# Patient Record
Sex: Female | Born: 1935 | Race: White | Hispanic: No | Marital: Married | Smoking: Never smoker
Health system: Southern US, Community
[De-identification: ages and names within clinical notes are randomized; demographics above are authoritative.]

## PROBLEM LIST (undated history)

## (undated) DIAGNOSIS — Z959 Presence of cardiac and vascular implant and graft, unspecified: Secondary | ICD-10-CM

## (undated) DIAGNOSIS — R42 Dizziness and giddiness: Secondary | ICD-10-CM

## (undated) DIAGNOSIS — G629 Polyneuropathy, unspecified: Secondary | ICD-10-CM

## (undated) DIAGNOSIS — I10 Essential (primary) hypertension: Secondary | ICD-10-CM

## (undated) DIAGNOSIS — M1990 Unspecified osteoarthritis, unspecified site: Secondary | ICD-10-CM

## (undated) DIAGNOSIS — E789 Disorder of lipoprotein metabolism, unspecified: Secondary | ICD-10-CM

## (undated) DIAGNOSIS — I4891 Unspecified atrial fibrillation: Secondary | ICD-10-CM

## (undated) DIAGNOSIS — Z9289 Personal history of other medical treatment: Secondary | ICD-10-CM

## (undated) DIAGNOSIS — M5417 Radiculopathy, lumbosacral region: Principal | ICD-10-CM

## (undated) DIAGNOSIS — G259 Extrapyramidal and movement disorder, unspecified: Secondary | ICD-10-CM

## (undated) DIAGNOSIS — K219 Gastro-esophageal reflux disease without esophagitis: Secondary | ICD-10-CM

## (undated) DIAGNOSIS — R55 Syncope and collapse: Secondary | ICD-10-CM

## (undated) DIAGNOSIS — F419 Anxiety disorder, unspecified: Secondary | ICD-10-CM

## (undated) DIAGNOSIS — I440 Atrioventricular block, first degree: Secondary | ICD-10-CM

## (undated) HISTORY — PX: CHOLECYSTECTOMY: SHX55

## (undated) HISTORY — PX: LUMBAR DISC SURGERY: SHX700

## (undated) HISTORY — DX: Radiculopathy, lumbosacral region: M54.17

## (undated) HISTORY — PX: BILATERAL OOPHORECTOMY: SHX1221

## (undated) HISTORY — PX: CATARACT EXTRACTION W/ INTRAOCULAR LENS  IMPLANT, BILATERAL: SHX1307

## (undated) HISTORY — PX: TOTAL KNEE ARTHROPLASTY: SHX125

## (undated) HISTORY — PX: APPENDECTOMY: SHX54

## (undated) HISTORY — DX: Extrapyramidal and movement disorder, unspecified: G25.9

## (undated) HISTORY — DX: Presence of cardiac and vascular implant and graft, unspecified: Z95.9

## (undated) HISTORY — PX: POSTERIOR LUMBAR FUSION: SHX6036

## (undated) HISTORY — PX: HIP FRACTURE SURGERY: SHX118

---

## 1963-07-02 HISTORY — PX: ABDOMINAL HYSTERECTOMY: SHX81

## 1969-07-01 DIAGNOSIS — Z9289 Personal history of other medical treatment: Secondary | ICD-10-CM

## 1969-07-01 HISTORY — DX: Personal history of other medical treatment: Z92.89

## 1969-07-01 HISTORY — PX: FOREARM FRACTURE SURGERY: SHX649

## 1969-07-01 HISTORY — PX: FEMUR FRACTURE SURGERY: SHX633

## 1969-07-01 HISTORY — PX: ANKLE FRACTURE SURGERY: SHX122

## 1970-07-01 HISTORY — PX: FEMUR HARDWARE REMOVAL: SUR1124

## 1999-04-12 ENCOUNTER — Encounter: Admission: RE | Admit: 1999-04-12 | Discharge: 1999-04-12 | Payer: Self-pay | Admitting: Internal Medicine

## 1999-04-25 ENCOUNTER — Encounter: Payer: Self-pay | Admitting: Orthopedic Surgery

## 1999-04-25 ENCOUNTER — Ambulatory Visit (HOSPITAL_COMMUNITY): Admission: RE | Admit: 1999-04-25 | Discharge: 1999-04-25 | Payer: Self-pay | Admitting: Orthopedic Surgery

## 1999-05-09 ENCOUNTER — Ambulatory Visit (HOSPITAL_COMMUNITY): Admission: RE | Admit: 1999-05-09 | Discharge: 1999-05-09 | Payer: Self-pay | Admitting: Orthopedic Surgery

## 1999-05-09 ENCOUNTER — Encounter: Payer: Self-pay | Admitting: Orthopedic Surgery

## 1999-05-23 ENCOUNTER — Ambulatory Visit (HOSPITAL_COMMUNITY): Admission: RE | Admit: 1999-05-23 | Discharge: 1999-05-23 | Payer: Self-pay | Admitting: Orthopedic Surgery

## 1999-05-23 ENCOUNTER — Encounter: Payer: Self-pay | Admitting: Orthopedic Surgery

## 1999-07-20 ENCOUNTER — Encounter: Payer: Self-pay | Admitting: Neurosurgery

## 1999-07-24 ENCOUNTER — Inpatient Hospital Stay (HOSPITAL_COMMUNITY): Admission: RE | Admit: 1999-07-24 | Discharge: 1999-07-26 | Payer: Self-pay | Admitting: Neurosurgery

## 1999-07-24 ENCOUNTER — Encounter: Payer: Self-pay | Admitting: Neurosurgery

## 1999-09-02 ENCOUNTER — Ambulatory Visit (HOSPITAL_COMMUNITY): Admission: RE | Admit: 1999-09-02 | Discharge: 1999-09-02 | Payer: Self-pay | Admitting: Neurosurgery

## 1999-09-02 ENCOUNTER — Encounter: Payer: Self-pay | Admitting: Neurosurgery

## 1999-09-07 ENCOUNTER — Encounter: Payer: Self-pay | Admitting: Neurosurgery

## 1999-09-07 ENCOUNTER — Inpatient Hospital Stay (HOSPITAL_COMMUNITY): Admission: RE | Admit: 1999-09-07 | Discharge: 1999-09-11 | Payer: Self-pay | Admitting: Neurosurgery

## 2003-04-04 ENCOUNTER — Encounter: Payer: Self-pay | Admitting: *Deleted

## 2003-04-04 ENCOUNTER — Inpatient Hospital Stay (HOSPITAL_COMMUNITY): Admission: EM | Admit: 2003-04-04 | Discharge: 2003-04-05 | Payer: Self-pay | Admitting: Emergency Medicine

## 2003-04-05 ENCOUNTER — Encounter: Payer: Self-pay | Admitting: *Deleted

## 2004-05-20 ENCOUNTER — Emergency Department (HOSPITAL_COMMUNITY): Admission: EM | Admit: 2004-05-20 | Discharge: 2004-05-20 | Payer: Self-pay | Admitting: Emergency Medicine

## 2004-10-16 ENCOUNTER — Inpatient Hospital Stay (HOSPITAL_COMMUNITY): Admission: EM | Admit: 2004-10-16 | Discharge: 2004-10-18 | Payer: Self-pay | Admitting: Emergency Medicine

## 2005-03-11 ENCOUNTER — Ambulatory Visit: Payer: Self-pay | Admitting: Gastroenterology

## 2005-03-14 ENCOUNTER — Ambulatory Visit: Payer: Self-pay | Admitting: Gastroenterology

## 2005-08-01 ENCOUNTER — Ambulatory Visit (HOSPITAL_COMMUNITY): Admission: RE | Admit: 2005-08-01 | Discharge: 2005-08-01 | Payer: Self-pay | Admitting: Internal Medicine

## 2005-11-04 ENCOUNTER — Emergency Department (HOSPITAL_COMMUNITY): Admission: EM | Admit: 2005-11-04 | Discharge: 2005-11-04 | Payer: Self-pay | Admitting: Emergency Medicine

## 2005-11-12 ENCOUNTER — Ambulatory Visit: Payer: Self-pay | Admitting: Gastroenterology

## 2005-11-15 ENCOUNTER — Ambulatory Visit: Payer: Self-pay | Admitting: Gastroenterology

## 2006-12-29 ENCOUNTER — Encounter: Admission: RE | Admit: 2006-12-29 | Discharge: 2006-12-29 | Payer: Self-pay | Admitting: Internal Medicine

## 2007-01-26 ENCOUNTER — Encounter: Admission: RE | Admit: 2007-01-26 | Discharge: 2007-01-26 | Payer: Self-pay | Admitting: Orthopedic Surgery

## 2007-01-27 ENCOUNTER — Encounter: Admission: RE | Admit: 2007-01-27 | Discharge: 2007-01-27 | Payer: Self-pay | Admitting: Orthopedic Surgery

## 2007-01-28 ENCOUNTER — Ambulatory Visit (HOSPITAL_BASED_OUTPATIENT_CLINIC_OR_DEPARTMENT_OTHER): Admission: RE | Admit: 2007-01-28 | Discharge: 2007-01-29 | Payer: Self-pay | Admitting: Orthopedic Surgery

## 2007-01-28 ENCOUNTER — Encounter (INDEPENDENT_AMBULATORY_CARE_PROVIDER_SITE_OTHER): Payer: Self-pay | Admitting: Orthopedic Surgery

## 2007-09-22 ENCOUNTER — Encounter: Admission: RE | Admit: 2007-09-22 | Discharge: 2007-09-22 | Payer: Self-pay | Admitting: Internal Medicine

## 2008-04-01 ENCOUNTER — Ambulatory Visit: Payer: Self-pay | Admitting: Gastroenterology

## 2008-04-01 DIAGNOSIS — K219 Gastro-esophageal reflux disease without esophagitis: Secondary | ICD-10-CM | POA: Insufficient documentation

## 2008-04-01 DIAGNOSIS — R1310 Dysphagia, unspecified: Secondary | ICD-10-CM | POA: Insufficient documentation

## 2008-04-01 DIAGNOSIS — K5730 Diverticulosis of large intestine without perforation or abscess without bleeding: Secondary | ICD-10-CM | POA: Insufficient documentation

## 2008-04-04 ENCOUNTER — Ambulatory Visit: Payer: Self-pay | Admitting: Gastroenterology

## 2008-04-06 ENCOUNTER — Encounter: Admission: RE | Admit: 2008-04-06 | Discharge: 2008-04-06 | Payer: Self-pay | Admitting: Internal Medicine

## 2008-04-15 ENCOUNTER — Ambulatory Visit (HOSPITAL_COMMUNITY): Admission: RE | Admit: 2008-04-15 | Discharge: 2008-04-15 | Payer: Self-pay | Admitting: Gastroenterology

## 2008-04-19 ENCOUNTER — Encounter: Payer: Self-pay | Admitting: Gastroenterology

## 2008-10-06 ENCOUNTER — Encounter: Admission: RE | Admit: 2008-10-06 | Discharge: 2008-10-06 | Payer: Self-pay | Admitting: Internal Medicine

## 2010-03-23 ENCOUNTER — Encounter: Admission: RE | Admit: 2010-03-23 | Discharge: 2010-03-23 | Payer: Self-pay | Admitting: Neurology

## 2010-07-22 ENCOUNTER — Encounter: Payer: Self-pay | Admitting: Internal Medicine

## 2010-09-21 ENCOUNTER — Ambulatory Visit: Payer: Medicare Other | Attending: Family Medicine | Admitting: Physical Therapy

## 2010-09-21 DIAGNOSIS — H81399 Other peripheral vertigo, unspecified ear: Secondary | ICD-10-CM | POA: Insufficient documentation

## 2010-09-21 DIAGNOSIS — IMO0001 Reserved for inherently not codable concepts without codable children: Secondary | ICD-10-CM | POA: Insufficient documentation

## 2010-10-02 ENCOUNTER — Ambulatory Visit: Payer: Medicare Other | Attending: Family Medicine | Admitting: Physical Therapy

## 2010-10-02 DIAGNOSIS — IMO0001 Reserved for inherently not codable concepts without codable children: Secondary | ICD-10-CM | POA: Insufficient documentation

## 2010-10-02 DIAGNOSIS — H81399 Other peripheral vertigo, unspecified ear: Secondary | ICD-10-CM | POA: Insufficient documentation

## 2010-10-04 ENCOUNTER — Ambulatory Visit: Payer: Medicare Other | Admitting: Physical Therapy

## 2010-10-10 ENCOUNTER — Ambulatory Visit: Payer: Medicare Other | Admitting: Physical Therapy

## 2010-10-12 ENCOUNTER — Ambulatory Visit: Payer: Medicare Other | Admitting: Physical Therapy

## 2010-10-17 ENCOUNTER — Ambulatory Visit: Payer: Medicare Other | Admitting: Physical Therapy

## 2010-10-19 ENCOUNTER — Ambulatory Visit: Payer: Medicare Other | Admitting: Physical Therapy

## 2010-10-24 ENCOUNTER — Ambulatory Visit: Payer: Medicare Other | Admitting: Physical Therapy

## 2010-10-26 ENCOUNTER — Ambulatory Visit: Payer: Medicare Other | Admitting: Physical Therapy

## 2010-10-30 ENCOUNTER — Ambulatory Visit: Payer: Medicare Other | Attending: Family Medicine | Admitting: Physical Therapy

## 2010-10-30 DIAGNOSIS — IMO0001 Reserved for inherently not codable concepts without codable children: Secondary | ICD-10-CM | POA: Insufficient documentation

## 2010-10-30 DIAGNOSIS — H81399 Other peripheral vertigo, unspecified ear: Secondary | ICD-10-CM | POA: Insufficient documentation

## 2010-11-02 ENCOUNTER — Ambulatory Visit: Payer: Medicare Other | Admitting: Physical Therapy

## 2010-11-06 ENCOUNTER — Encounter: Payer: Medicare Other | Admitting: Physical Therapy

## 2010-11-08 ENCOUNTER — Ambulatory Visit: Payer: Medicare Other | Admitting: Physical Therapy

## 2010-11-13 NOTE — Op Note (Signed)
NAMEDONNELL, Kristen Garrison              ACCOUNT NO.:  [id number]   MEDICAL RECORD NO.:  [id number]          PATIENT TYPE:  AMB   LOCATION:  DSC                          FACILITY:  [facility]   PHYSICIAN:  Artist Pais. 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   PROCEDURE:  Right wrist Darrach procedure with extensor carpi ulnaris  tendon transfer.   SURGEON:  Artist Pais. Mina Marble, M.D.   ASSISTANT:  None.   ANESTHESIA:  General.   TOURNIQUET TIME:  40 minutes.   COMPLICATIONS:  None.   DRAINS:  None.   OPERATIVE REPORT:  The patient was taken to operating suite after  induction of general anesthesia.  Right upper extremity is prepped and  draped in sterile fashion.  Esmarch used to exsanguinate the limb.  Tourniquet was then inflated to 250 mm.  At this point time incision  made over the distal radial ulnar joint.  Wight wrist skin was incised.  The interval overlying the sixth dorsal compartment was incised.  The EC  tendons retracted ulnarly.  The capsule overlying the distal radial  ulnar joint was incised.  There was significant hypertrophic synovium  which was sent for confirmation and pathology.  The distal end of the  ulna was carefully resected using the small oscillating saw at the head  and neck junction.  Once this was done, the remaining aspects of joint  were debrided.  A distally based strip of the ECU tendon was harvested.  It was then placed through a drill hole in the shaft of the ulna and  sewn onto itself, thus completing the tendon transfer.  This was done  using 2-0  FiberWire.  The wound was then irrigated.  It was closed in  layers of 2-0 FiberWire in the capsule and 4-0 Vicryl on the retinaculum  overlying the extensor tendons followed by skin closure with a 3-0  Prolene.  4x4s, fluffs and a compressive dressing was applied as well as  ulnar gutter splint.  The patient tolerated well and went recovery room  stable fashion.     Artist Pais Mina Marble, M.D.  Electronically Signed    MAW/MEDQ  D:  01/29/2007  T:  01/30/2007  Job:  [id number]

## 2010-11-16 ENCOUNTER — Ambulatory Visit: Payer: Medicare Other | Admitting: Physical Therapy
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
## 2010-11-16 NOTE — Discharge Summary (Signed)
NAMESAVINA, OLSHEFSKI NO.:  [id number]   MEDICAL RECORD NO.:  [id number]          PATIENT TYPE:  INP   LOCATION:  3735                         FACILITY:  [facility]   PHYSICIAN:  Lyn Records, M.D.   DATE OF BIRTH:  May 11, 1927   DATE OF ADMISSION:  10/16/2004  DATE OF DISCHARGE:  10/18/2004                                 DISCHARGE SUMMARY   PRIMARY CARE PHYSICIAN:  Dr. Chilton Si.   CHIEF COMPLAINT AND REASON FOR ADMISSION:  Kristen Garrison is a 77 year old  patient of Dr. Thomasene Lot, wife of one of Dr. Michaelle Copas patient's, Kristen Garrison.  She was in Dr. Thomasene Lot office on the day of admission and began  complaining of palpitations that had started earlier in the morning.  Telemetry monitoring revealed atrial fibrillation with 2:1 AV conduction,  ventricular rate 160.  Subsequent rate was decreased to 75 beats per minute  and clear flutter waves were noted.  Ten minutes later spontaneously, the  patient developed high-grade AV block, heart rate down in the 40s, she  became quite pale in appearance and developed near syncope.  By the time she  was seen in the ER, she was back in sinus rhythm with a ventricular rate of  60 beats per minute.  In talking with the patient, Dr. Katrinka Blazing illicited that  she had been experiencing tachy-palpitations about three weeks ago, and the  last episode lasted about three hours.   PHYSICAL EXAMINATION:  Her blood pressure was stable at 148/70, heart rate  60 and regular, monitor shows first degree AV block.  There was a S4 on  clinical examination, otherwise examination was unremarkable.   LABORATORY DATA:  EKG showed first degree AV block as mentioned with a P-to-  R of 232 milliseconds.   Kristen Garrison was admitted by Dr. Katrinka Blazing with the following diagnoses:  1.  Atrial flutter with spontaneous high-grade AV block, symptomatic,      probable tachy-brady syndrome.  2.  History of cerebrovascular accident about 10 years prior.  Questionable     if related to thromboembolic event due to undiagnosed atrial flutter.  3.  Hypertension.  4.  Gastroesophageal reflux disease.   HOSPITAL COURSE:  ATRIAL FLUTTER AND SPONTANEOUS HIGH-GRADE BLOCK, QUESTION  TACHY-BRADY SYNDROME:  The patient was admitted to the telemetry unit where  she was continued on monitoring.  A 2-D echocardiogram was checked.  She was  started on IV heparin, and strips from Dr. Thomasene Lot office were requested for  review by Dr. Katrinka Blazing.  The following morning, she had no recurrence of  dysrhythmia.  Dr. Katrinka Blazing also reviewed the strips from Dr. Thomasene Lot office  where the patient had atrial flutter converted to sinus bradycardia with a  ventricular rate of 48 beats per minute.  She was symptomatic, but  apparently she had also received sublingual nitroglycerin and this may have  clouded the picture, i.e., the patient could of had a vaso-vagal event from  vasodilation and subsequent drop in blood pressure.  Her potassium was also  noted to be somewhat low at 3.1, and this was orally repleted.  Because of  her underlying first degree block and recent issues with high-grade block,  her atenolol was discontinued.  She was started on Cardizem with plans to  consider discharge home soon.  A 2-D echocardiogram was checked and this  showed overall LV systolic function normal with an EF of 55 to 65%, no  regional wall motion abnormalities, and a possible trivial pericardial  effusion posterior to the heart, dimensions were 4 mm posteriorly.   By October 18, 2004, she had no further recurrent episodes of SVT or  significant bradycardia.  She developed a questionable headache and nausea  probably due to the Cardizem.  Her blood pressure was stable at 122/70,  heart rate 70 and regular, potassium 3.8.  At this time, Dr. Katrinka Blazing felt she  was experiencing paroxysmal atrial flutter and fib with  ____________bradycardic consistent with tachy-brady syndrome without further  occurrences.   She was deemed appropriate for discharge home that day.  She  will be following up with Dr. Chilton Si in his office for Coumadin adjustments  and see Dr. Katrinka Blazing in two to three weeks.   FINAL DISCHARGE DIAGNOSES:  1.  Paroxysmal atrial flutter and atrial fibrillation, currently maintaining      sinus rhythm.  2.  Post-conversion bradycardia consistent with tachy-brady syndrome.  3.  Prior history of cerebrovascular accident, questionable related to a      thrombo-embolic event due to unknown paroxysmal atrial fibrillation at      that time.  4.  Hypertension, controlled.  5.  Dyslipidemia.   DISCHARGE MEDICATIONS:  1.  Stop atenolol.  2.  Cardizem CD 180 mg daily, new.  3.  Coumadin 5 mg tablets as directed, new.  4.  Lasix 40 mg daily.  5.  K-Dur 20 mEq daily.  6.  Prevacid 30 mg daily or Protonix 40 mg daily, whichever you have at      home.  7.  Premarin 0.625 mg daily.  8.  Stop aspirin.   DIET:  Heart healthy, Coumadin restrictions.   FOLLOWUP:  1.  Dr. Chilton Si on Monday for a Coumadin check.  2.  Dr. Katrinka Blazing.  The office will call her with an appointment in two to three      weeks.       ALE/MEDQ  D:  12/24/2004  T:  12/24/2004  Job:  [id number]   cc:   Lenon Curt. Chilton Si, M.D.  [address].  [place]  [place] [address]  Fax: [phone]
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
## 2010-11-16 NOTE — Op Note (Signed)
[place]. [facility]  Patient:    Kristen Garrison, Kristen Garrison                     MRN: [id number] Proc. Date: 09/07/99 Adm. Date:  [id number] Attending:  Emeterio Reeve                           Operative Report  PREOPERATIVE DIAGNOSIS:  Recurrent herniated disk of L4-5 on the left.  POSTOPERATIVE DIAGNOSIS:  Recurrent herniated disk of L4-5 on the left.  OPERATIVE PROCEDURE:  L4-5 laminectomy and diskectomy, posterior lumbar interbody fusion with a Ray threaded fusion cage.  SURGEON:  Payton Doughty, M.D.  ANESTHESIA:  General endotracheal.  PREPARATION:  Prepped with sterile Betadine, and prepped and scrubbed with alcohol wipe.  COMPLICATIONS:  None.  INDICATIONS:  This is a 77 year old right-handed lady who had a disk done at  4-5 about six weeks ago, and has a disk recurrence at that level.  DESCRIPTION OF PROCEDURE:  She was taken to the operating room, smoothly anesthetized and intubated, and placed prone on the operating table.  Following  shave, prep, and drape in the usual sterile fashion, the old skin incision was reopened and the lamina of L4 identified bilaterally, out over the 4-5 facet joint. Intraoperative x-ray confirmed correctness of level.  The pars interarticularis, remaining lamina, and inferior facet of L4 were removed, as well as the superior facet of L5.  This was done bilaterally.  The ligamentum flavum was removed. Dissecting through the scar, the L4 and 5 roots were identified on the left side, and underneath them was found a large recurrent herniated disk which was removed without difficulty.  The right side similarly was decompressed and diskectomy completed, and there was no disk rupture at that point.  Following this, 14 x 21 mm Ray threaded fusion cages were placed bilaterally. Intraoperative x-ray showed good placement of cages.  The wound was irrigated and hemostasis assured.  The cages packed with bone graft  harvested from the facet joint.  They were capped.  Intraoperative x-ray showed good placement of the cages. The fascia was reapproximated with 0 Vicryl in an interrupted fashion. Subcutaneous tissue was reapproximated with 0 Vicryl in an interrupted fashion.  Subcuticular tissue was reapproximated with 3-0 Vicryl in an interrupted fashion, and the skin was closed with 3-0 nylon in a running locked fashion.  A Betadine Telfa dressing was applied and made occlusive with Op-Site.  The patient was then returned to the recovery room in good condition. DD:  09/07/99 TD:  09/09/99 Job: [id number]

## 2010-11-16 NOTE — Op Note (Signed)
[place]. [facility]  Patient:    Kristen Garrison                      MRN: [id number] Proc. Date: 07/24/99 Adm. Date:  [id number] Attending:  Emeterio Reeve                           Operative Report  PREOPERATIVE DIAGNOSIS:  Herniated disc on the left side at L4-5.  POSTOPERATIVE DIAGNOSIS:  Herniated disc on the left side at L4-5.  OPERATIVE PROCEDURE:  L4-5 laminectomy and diskectomy.  SURGEON:  Payton Doughty, M.D.  SERVICE:  Neurosurgery.  ANESTHESIA:  General endotracheal.  PREPARATION:  Prepped with sterile Betadine prep and scrubbed with alcohol wipe.  COMPLICATIONS:  None.  DESCRIPTION OF PROCEDURE:  [age]-year-old right-handed white lady with a left  L4-5 disc, taken to the operating room and smoothly anesthetized and intubated nd placed prone on the operating table.  Following shave, prepped and draped in the usual sterile fashion.  Skin was infiltrated with 1% lidocaine with 1:400,000 epinephrine and her old skin incision was reopened.  The laminae of L4 and L5 were exposed on the left side in the subperiosteal plane.  Intraoperative x-ray confirmed correctness of the level.  An L4 hemi-semilaminectomy was carried out the ______ with the ligamentum flavum, which was removed in a retrograde fashion. There was some undercutting at the superior end of L5 as well.  This exposed the dura and the L5 root as it coursed over the disc space.  There was a large herniated disc protruding into the L5 root.  This was removed without difficulty and the disc space carefully explored for any remaining or marginally clinging fragments.  Having removed these, wound was irrigated and hemostasis assured, then Depo-Medrol-soaked fat was placed over the nerve root in the laminectomy defect. Fascia was reapproximated with 0 Vicryl in interrupted fashion, subcutaneous tissue was reapproximated with 0 Vicryl in interrupted fashion and the skin was  closed  with 3-0 nylon in a running locked fashion.  Betadine and Telfa dressing were applied and made occlusive with Op-Site and the patient returned to the recovery room in good condition. DD:  07/24/99 TD:  07/25/99 Job: [id number]

## 2010-11-20 ENCOUNTER — Encounter: Payer: Medicare Other | Admitting: Physical Therapy

## 2010-11-21 ENCOUNTER — Ambulatory Visit: Payer: Medicare Other | Admitting: Physical Therapy

## 2010-11-28 ENCOUNTER — Ambulatory Visit: Payer: Medicare Other | Admitting: Physical Therapy

## 2010-11-29 ENCOUNTER — Encounter: Payer: Medicare Other | Admitting: Physical Therapy

## 2010-12-03 ENCOUNTER — Ambulatory Visit: Payer: Medicare Other | Attending: Family Medicine | Admitting: Physical Therapy

## 2010-12-03 DIAGNOSIS — IMO0001 Reserved for inherently not codable concepts without codable children: Secondary | ICD-10-CM | POA: Insufficient documentation

## 2010-12-03 DIAGNOSIS — H81399 Other peripheral vertigo, unspecified ear: Secondary | ICD-10-CM | POA: Insufficient documentation

## 2010-12-05 ENCOUNTER — Encounter: Payer: Medicare Other | Admitting: Physical Therapy

## 2011-01-28 ENCOUNTER — Emergency Department (HOSPITAL_COMMUNITY): Payer: Medicare Other

## 2011-01-28 ENCOUNTER — Inpatient Hospital Stay (HOSPITAL_COMMUNITY)
Admission: EM | Admit: 2011-01-28 | Discharge: 2011-01-31 | DRG: 310 | Disposition: A | Discharge status: Home / Self Care | Payer: Medicare Other | Source: Ambulatory Visit | Attending: Interventional Cardiology | Admitting: Interventional Cardiology

## 2011-01-28 DIAGNOSIS — I4891 Unspecified atrial fibrillation: Principal | ICD-10-CM | POA: Diagnosis present

## 2011-01-28 DIAGNOSIS — I10 Essential (primary) hypertension: Secondary | ICD-10-CM | POA: Diagnosis present

## 2011-01-28 DIAGNOSIS — R413 Other amnesia: Secondary | ICD-10-CM | POA: Diagnosis present

## 2011-01-28 DIAGNOSIS — IMO0002 Reserved for concepts with insufficient information to code with codable children: Secondary | ICD-10-CM

## 2011-01-28 DIAGNOSIS — R079 Chest pain, unspecified: Secondary | ICD-10-CM | POA: Diagnosis present

## 2011-01-28 DIAGNOSIS — Z9849 Cataract extraction status, unspecified eye: Secondary | ICD-10-CM

## 2011-01-28 LAB — CBC
HCT: 39.8 % (ref 36.0–46.0)
Hemoglobin: 13.9 g/dL (ref 12.0–15.0)
MCH: 31.2 pg (ref 26.0–34.0)
RBC: 4.45 MIL/uL (ref 3.87–5.11)

## 2011-01-28 LAB — DIFFERENTIAL
Eosinophils Absolute: 0.1 10*3/uL (ref 0.0–0.7)
Lymphs Abs: 4.3 10*3/uL — ABNORMAL HIGH (ref 0.7–4.0)
Neutrophils Relative %: 46 % (ref 43–77)

## 2011-01-28 LAB — CK TOTAL AND CKMB (NOT AT ARMC)
CK, MB: 2.6 ng/mL (ref 0.3–4.0)
Relative Index: INVALID (ref 0.0–2.5)
Total CK: 44 U/L (ref 7–177)

## 2011-01-28 LAB — BASIC METABOLIC PANEL
Calcium: 9 mg/dL (ref 8.4–10.5)
Chloride: 105 mEq/L (ref 96–112)
GFR calc Af Amer: 58 mL/min — ABNORMAL LOW (ref >60)
Glucose, Bld: 107 mg/dL — ABNORMAL HIGH (ref 70–99)

## 2011-01-28 LAB — CARDIAC PANEL(CRET KIN+CKTOT+MB+TROPI): Troponin I: <0.3 ng/mL (ref <0.30)

## 2011-01-29 LAB — CARDIAC PANEL(CRET KIN+CKTOT+MB+TROPI)
CK, MB: 3.3 ng/mL (ref 0.3–4.0)
Relative Index: INVALID (ref 0.0–2.5)
Total CK: 45 U/L (ref 7–177)

## 2011-01-29 LAB — CBC
HCT: 38.7 % (ref 36.0–46.0)
MCH: 30.6 pg (ref 26.0–34.0)
MCHC: 33.6 g/dL (ref 30.0–36.0)
MCV: 91.1 fL (ref 78.0–100.0)
Platelets: 265 10*3/uL (ref 150–400)
RBC: 4.25 MIL/uL (ref 3.87–5.11)
RDW: 13.6 % (ref 11.5–15.5)
WBC: 7.6 10*3/uL (ref 4.0–10.5)

## 2011-01-29 LAB — HEPARIN LEVEL (UNFRACTIONATED): Heparin Unfractionated: 0.68 IU/mL (ref 0.30–0.70)

## 2011-01-30 LAB — CBC
HCT: 40.7 % (ref 36.0–46.0)
MCHC: 33.7 g/dL (ref 30.0–36.0)
MCV: 91.1 fL (ref 78.0–100.0)
Platelets: 289 10*3/uL (ref 150–400)
RDW: 13.5 % (ref 11.5–15.5)

## 2011-01-30 LAB — BASIC METABOLIC PANEL
BUN: 20 mg/dL (ref 6–23)
Calcium: 9.3 mg/dL (ref 8.4–10.5)
Creatinine, Ser: 1.09 mg/dL (ref 0.50–1.10)
GFR calc Af Amer: 58 mL/min — ABNORMAL LOW (ref >60)
GFR calc non Af Amer: 48 mL/min — ABNORMAL LOW (ref >60)
Glucose, Bld: 82 mg/dL (ref 70–99)

## 2011-01-30 NOTE — H&P (Signed)
Kristen Garrison, Kristen Garrison NO.:  [id number]  MEDICAL RECORD NO.:  [id number]  LOCATION:  4735                         FACILITY:  [facility]  PHYSICIAN:  Corky Crafts, MDDATE OF BIRTH:  23-Nov-1926  DATE OF ADMISSION:  01/28/2011 DATE OF DISCHARGE:                             HISTORY & PHYSICAL   PRIMARY CARDIOLOGIST:  Lyn Records, MD  PRIMARY CARE PHYSICIAN:  Lenon Curt. Chilton Si, MD  REASON FOR ADMISSION:  Chest pain and palpitations.  HISTORY OF PRESENT ILLNESS:  The patient is an 77 year old who has had SVT and atrial fibrillation in the past.  Her symptoms have been controlled with diltiazem.  This morning, she had a pressure in her chest starting about 6 in the morning, it was associated with belching. There was no real pain; however, continue to get worse and she called her primary care doctor, she went to see him.  She was found to have a very fast heart rate.  She was given sublingual nitroglycerin and nearly passed out.  Her blood pressure dropped, but she did not actually lose consciousness.  She came to the emergency room and her pressure was still low.  They were limited in terms of what they could give her.  Her chest pain eventually resolved.  She converted to normal sinus rhythm in the emergency room before IV diltiazem was given.  She works out typically at [employer] without any problems.  She has not worked out in the past 3 weeks because she has been having cataract surgery done.  She is due to have a second cataract surgery in a few days; however, with using some of the exercise machine, she has not had chest pain recently.  ALLERGIES: 1. COUMADIN. 2. DOXYCYCLINE. 3. AZITHROMYCIN. 4. LYRICA. 5. GABAPENTIN. 6. CYMBALTA. 7. CARDURA. 8. PRINIVIL. 9. TENEX.  PAST MEDICAL HISTORY: 1. Atrial fibrillation. 2. Anxiety. 3. Neuropathy. 4. High cholesterol. 5. Degenerative joint disease. 6. History of TIA.  SOCIAL HISTORY:  She is  married.  She does not smoke.  FAMILY HISTORY:  Brother who had a stent, no early CAD.  PAST SURGICAL HISTORY:  Multiple various surgeries in the past.  HOME MEDICATIONS: 1. Alphagan eye drops. 2. Travatan. 3. Premarin. 4. Diovan 320 mg daily. 5. Furosemide 40 mg daily. 6. Magnesium. 7. Prednisone 5 mg daily. 8. Clorazepate. 9. Metoclopramide. 10.Protonix 40 mg daily. 11.Diazepam 5 mg p.r.n. 12.Potassium. 13.Diltiazem 240 mg daily. 14.Aspirin 81 mg.  REVIEW OF SYSTEMS:  As noted above.  PHYSICAL EXAMINATION:  VITAL SIGNS:  Blood pressure 124/51, heart rate of 16. GENERAL:  She is awake and alert in no apparent distress. HEAD:  Normocephalic, atraumatic. NECK:  No JVD. CARDIOVASCULAR:  Regular rate and rhythm.  S1-S2. LUNGS:  Clear to auscultation bilaterally. ABDOMEN:  Mildly obese, nontender. EXTREMITIES:  Showed no edema.  ECG showed atrial fibrillation with RVR.  Subsequent ECG shows normal sinus rhythm.  Lab work shows troponin less than 0.3, creatinine 1.1, hemoglobin 13.9.  Chest x-ray shows no acute findings.  ASSESSMENT AND PLAN:  An 77 year old with paroxysmal atrial fibrillation, start heparin.  She does not tolerate Coumadin, consider newer anticoagulant, Pradaxa or Xarelto; however, increased bleeding risk  given age. 1. Chest pain, watch on tele, rule out myocardial infarction. 2. Hypertension.  Continue Diovan and furosemide likely her low blood     pressures related to the cuff not picking up her very fast heart     rate.  Now that her heart rate and rhythm have normalized, her     blood pressure has back up into the normal range.     Corky Crafts, MD     JSV/MEDQ  D:  01/28/2011  T:  01/29/2011  Job:  [id number]  Electronically Signed by Lance Muss MD on 01/30/2011 01:23:16 PM

## 2011-01-31 LAB — BASIC METABOLIC PANEL
Calcium: 9.4 mg/dL (ref 8.4–10.5)
GFR calc Af Amer: 54 mL/min — ABNORMAL LOW (ref >60)
GFR calc non Af Amer: 45 mL/min — ABNORMAL LOW (ref >60)
Glucose, Bld: 76 mg/dL (ref 70–99)
Potassium: 4.1 mEq/L (ref 3.5–5.1)
Sodium: 139 mEq/L (ref 135–145)

## 2011-01-31 LAB — CBC
MCH: 30.3 pg (ref 26.0–34.0)
MCHC: 33.4 g/dL (ref 30.0–36.0)
Platelets: 260 10*3/uL (ref 150–400)
RDW: 13.4 % (ref 11.5–15.5)

## 2011-03-01 NOTE — Discharge Summary (Signed)
  Kristen Garrison, HEWARD NO.:  [id number]  MEDICAL RECORD NO.:  [id number]  LOCATION:  4736                         FACILITY:  [facility]  PHYSICIAN:  Lyn Records, M.D.   DATE OF BIRTH:  31-May-1927  DATE OF ADMISSION:  01/28/2011 DATE OF DISCHARGE:  01/31/2011                              DISCHARGE SUMMARY   REASON FOR ADMISSION:  Rapid heart rate and chest tightness.  DISCHARGE DIAGNOSES: 1. Paroxysmal atrial fibrillation.     a.     Spontaneous conversion. 2. Hypertension. 3. Chest pain without evidence of myocardial infarction. 4. Memory loss, chronic.  PROCEDURES PERFORMED:  Echocardiogram will be done as an outpatient.  PLANS: 1. Medications include new therapies as follows:  Amlodipine 5 mg     daily, rivaroxaban (Xarelto) 15 mg daily, sotalol 80 mg twice     daily.  The patient is to discontinue aspirin and diltiazem.  Other     medications to be continued as previously prescribed are Alphagan     ophthalmic solution 2 drops daily, clorazepate 7.5 mg 1 as needed,     diazepam 5 mg 1 as needed, Diovan 320 mg daily, furosemide 40 mg     daily, magnesium 500 mg daily, metoclopramide 5 mg 1/2 tablet     daily, potassium chloride 20 mEq daily, prednisone 5 mg per day,     Premarin 1 tablet every morning, Protonix 40 mg twice daily,     Travatan 0.004% both eyes. 2. Activity:  As tolerated. 3. Office visit with Dr. Verdis Prime and/or Cristopher Peru, nurse     practitioner on February 11, 2011, at 3 p.m. 4. Call if any bleeding. 5. Outpatient 2-D Doppler echocardiogram to follow up on cardiac     function.  HISTORY AND PHYSICAL AND HOSPITAL COURSE:  Please see the admitting history and physical.  The patient had chest pain on admission that resolved after spontaneous conversion of atrial fibrillation.  Cardiac markers were negative for evidence of injury.  EKGs were nonischemic.  Because of the patient's severe symptoms and a known history  of recurrent palpitations and a high CHADS score of greater than 3 (including TIA), antithrombotic therapy with rivaroxaban 15 mg per day was felt indicated.  We also sought to control the patient's rhythm with sotalol 80 mg b.i.d. because she was so symptomatic in atrial fibrillation.  She was given 4 doses of sotalol while being monitored.  No evidence of QT prolongation was noted.  She is discharged home in improved condition.     Lyn Records, M.D.     HWS/MEDQ  D:  01/31/2011  T:  01/31/2011  Job:  [id number]  cc:   Erskine Speed, M.D.  Electronically Signed by Verdis Prime M.D. on 03/01/2011 04:20:18 PM

## 2011-03-13 ENCOUNTER — Emergency Department (HOSPITAL_COMMUNITY)
Admission: EM | Admit: 2011-03-13 | Discharge: 2011-03-13 | Disposition: A | Discharge status: Home / Self Care | Payer: Medicare Other | Attending: Emergency Medicine | Admitting: Emergency Medicine

## 2011-03-13 DIAGNOSIS — K3189 Other diseases of stomach and duodenum: Secondary | ICD-10-CM | POA: Insufficient documentation

## 2011-03-13 DIAGNOSIS — Z8711 Personal history of peptic ulcer disease: Secondary | ICD-10-CM | POA: Insufficient documentation

## 2011-03-13 DIAGNOSIS — I4891 Unspecified atrial fibrillation: Secondary | ICD-10-CM | POA: Insufficient documentation

## 2011-03-13 DIAGNOSIS — R000 Tachycardia, unspecified: Secondary | ICD-10-CM | POA: Insufficient documentation

## 2011-03-13 DIAGNOSIS — R141 Gas pain: Secondary | ICD-10-CM | POA: Insufficient documentation

## 2011-03-13 DIAGNOSIS — E7800 Pure hypercholesterolemia, unspecified: Secondary | ICD-10-CM | POA: Insufficient documentation

## 2011-03-13 DIAGNOSIS — Z8673 Personal history of transient ischemic attack (TIA), and cerebral infarction without residual deficits: Secondary | ICD-10-CM | POA: Insufficient documentation

## 2011-03-13 DIAGNOSIS — I10 Essential (primary) hypertension: Secondary | ICD-10-CM | POA: Insufficient documentation

## 2011-03-13 DIAGNOSIS — R002 Palpitations: Secondary | ICD-10-CM | POA: Insufficient documentation

## 2011-03-13 DIAGNOSIS — J3489 Other specified disorders of nose and nasal sinuses: Secondary | ICD-10-CM | POA: Insufficient documentation

## 2011-03-13 DIAGNOSIS — R1013 Epigastric pain: Secondary | ICD-10-CM | POA: Insufficient documentation

## 2011-03-13 DIAGNOSIS — R0789 Other chest pain: Secondary | ICD-10-CM | POA: Insufficient documentation

## 2011-03-13 DIAGNOSIS — R142 Eructation: Secondary | ICD-10-CM | POA: Insufficient documentation

## 2011-03-13 DIAGNOSIS — Z79899 Other long term (current) drug therapy: Secondary | ICD-10-CM | POA: Insufficient documentation

## 2011-03-13 DIAGNOSIS — F411 Generalized anxiety disorder: Secondary | ICD-10-CM | POA: Insufficient documentation

## 2011-03-13 LAB — POCT I-STAT, CHEM 8
Glucose, Bld: 83 mg/dL (ref 70–99)
HCT: 41 % (ref 36.0–46.0)
Hemoglobin: 13.9 g/dL (ref 12.0–15.0)
Potassium: 4.1 mEq/L (ref 3.5–5.1)
Sodium: 142 mEq/L (ref 135–145)

## 2011-03-13 LAB — POCT I-STAT TROPONIN I: Troponin i, poc: 0 ng/mL (ref 0.00–0.08)

## 2011-04-15 LAB — BASIC METABOLIC PANEL
BUN: 17
CO2: 28
Calcium: 9.2
Chloride: 103
Creatinine, Ser: 1.27 — ABNORMAL HIGH
GFR calc Af Amer: 49 — ABNORMAL LOW
GFR calc non Af Amer: 41 — ABNORMAL LOW
Glucose, Bld: 120 — ABNORMAL HIGH
Potassium: 4.7
Sodium: 138

## 2011-04-15 LAB — POCT HEMOGLOBIN-HEMACUE
Hemoglobin: 13
Operator id: 116011

## 2012-04-29 ENCOUNTER — Emergency Department (HOSPITAL_COMMUNITY): Payer: Medicare Other

## 2012-04-29 ENCOUNTER — Encounter (HOSPITAL_COMMUNITY): Payer: Self-pay

## 2012-04-29 ENCOUNTER — Emergency Department (HOSPITAL_COMMUNITY)
Admission: EM | Admit: 2012-04-29 | Discharge: 2012-04-29 | Disposition: A | Discharge status: Home / Self Care | Payer: Medicare Other | Attending: Emergency Medicine | Admitting: Emergency Medicine

## 2012-04-29 DIAGNOSIS — S0990XA Unspecified injury of head, initial encounter: Secondary | ICD-10-CM

## 2012-04-29 DIAGNOSIS — S4980XA Other specified injuries of shoulder and upper arm, unspecified arm, initial encounter: Secondary | ICD-10-CM | POA: Insufficient documentation

## 2012-04-29 DIAGNOSIS — R791 Abnormal coagulation profile: Secondary | ICD-10-CM | POA: Insufficient documentation

## 2012-04-29 DIAGNOSIS — S60219A Contusion of unspecified wrist, initial encounter: Secondary | ICD-10-CM | POA: Insufficient documentation

## 2012-04-29 DIAGNOSIS — Z7901 Long term (current) use of anticoagulants: Secondary | ICD-10-CM

## 2012-04-29 DIAGNOSIS — T07XXXA Unspecified multiple injuries, initial encounter: Secondary | ICD-10-CM

## 2012-04-29 DIAGNOSIS — I10 Essential (primary) hypertension: Secondary | ICD-10-CM | POA: Insufficient documentation

## 2012-04-29 DIAGNOSIS — IMO0002 Reserved for concepts with insufficient information to code with codable children: Secondary | ICD-10-CM | POA: Insufficient documentation

## 2012-04-29 DIAGNOSIS — Z79899 Other long term (current) drug therapy: Secondary | ICD-10-CM | POA: Insufficient documentation

## 2012-04-29 DIAGNOSIS — Y9301 Activity, walking, marching and hiking: Secondary | ICD-10-CM | POA: Insufficient documentation

## 2012-04-29 DIAGNOSIS — M25512 Pain in left shoulder: Secondary | ICD-10-CM

## 2012-04-29 DIAGNOSIS — S46909A Unspecified injury of unspecified muscle, fascia and tendon at shoulder and upper arm level, unspecified arm, initial encounter: Secondary | ICD-10-CM | POA: Insufficient documentation

## 2012-04-29 DIAGNOSIS — M129 Arthropathy, unspecified: Secondary | ICD-10-CM | POA: Insufficient documentation

## 2012-04-29 DIAGNOSIS — S60212A Contusion of left wrist, initial encounter: Secondary | ICD-10-CM

## 2012-04-29 DIAGNOSIS — I4891 Unspecified atrial fibrillation: Secondary | ICD-10-CM | POA: Insufficient documentation

## 2012-04-29 DIAGNOSIS — Y9289 Other specified places as the place of occurrence of the external cause: Secondary | ICD-10-CM | POA: Insufficient documentation

## 2012-04-29 DIAGNOSIS — R296 Repeated falls: Secondary | ICD-10-CM | POA: Insufficient documentation

## 2012-04-29 HISTORY — DX: Unspecified osteoarthritis, unspecified site: M19.90

## 2012-04-29 HISTORY — DX: Unspecified atrial fibrillation: I48.91

## 2012-04-29 HISTORY — DX: Essential (primary) hypertension: I10

## 2012-04-29 NOTE — ED Notes (Signed)
MD at bedside. 

## 2012-04-29 NOTE — ED Notes (Signed)
Pt sts she fell while moving her trash can and landed on her left side.  Fall was unwitnessed.  Sts she landed on concrete.  Sts left shoulder and wrist pain and a headache.  Denies losing consciousness.  Noted hematoma on left hand, skin tear on left wrist, skin tear on right thumb and an abrasion and bruise over left eye.  Denies vision problem in left eye.

## 2012-04-29 NOTE — ED Provider Notes (Signed)
History    85yF presenting after fall. Mechanical. Lost balance. Fell on L side. C/o L shoulder and wrist pain. Did strike head. No loc. Mild HA. No numbness, tingling or loss of strength. No n/v. No visual changes. On xarelto.no neck or back pain.   CSN: [id number]  Arrival date & time 04/29/12  1544   First MD Initiated Contact with Patient 04/29/12 1843      Chief Complaint  Patient presents with  . Fall    (Consider location/radiation/quality/duration/timing/severity/associated sxs/prior treatment) HPI  Past Medical History  Diagnosis Date  . Hypertension   . Arthritis   . Atrial fibrillation     Past Surgical History  Procedure Date  . Cholecystectomy   . Appendectomy   . Abdominal hysterectomy   . Joint replacement     R knee  . Hip fracture surgery   . Ankle fracture surgery     History reviewed. No pertinent family history.  History  Substance Use Topics  . Smoking status: Never Smoker   . Smokeless tobacco: Not on file  . Alcohol Use: No    OB History    Grav Para Term Preterm Abortions TAB SAB Ect Mult Living                  Review of Systems   Review of symptoms negative unless otherwise noted in HPI.   Allergies  Celecoxib; Doxycycline; Guanfacine hcl; Lisinopril; Sulfonamide derivatives; and Warfarin sodium  Home Medications   Current Outpatient Rx  Name Route Sig Dispense Refill  . AMLODIPINE BESYLATE 5 MG PO TABS Oral Take 5 mg by mouth daily.    Marland Kitchen BRIMONIDINE TARTRATE 0.1 % OP SOLN Both Eyes Place 1 drop into both eyes 2 (two) times daily.    Marland Kitchen DIAZEPAM 5 MG PO TABS Oral Take 2.5 mg by mouth at bedtime as needed. For anxiety/sleep.    Marland Kitchen ESTROGENS CONJUGATED 0.3 MG PO TABS Oral Take 0.3 mg by mouth daily.    . ETODOLAC 400 MG PO TABS Oral Take 400 mg by mouth 3 (three) times daily.    . FUROSEMIDE 40 MG PO TABS Oral Take 40 mg by mouth daily.    Marland Kitchen PANTOPRAZOLE SODIUM 40 MG PO TBEC Oral Take 40 mg by mouth 2 (two) times daily.      Marland Kitchen POTASSIUM CHLORIDE CRYS ER 10 MEQ PO TBCR Oral Take 10 mEq by mouth daily.    Marland Kitchen PREDNISONE 1 MG PO TABS Oral Take 2 mg by mouth daily.     Marland Kitchen RIVAROXABAN 15 MG PO TABS Oral Take 15 mg by mouth daily.    Marland Kitchen SOTALOL HCL 80 MG PO TABS Oral Take 80 mg by mouth 2 (two) times daily.    . TRAMADOL HCL 50 MG PO TABS Oral Take 50 mg by mouth 2 (two) times daily as needed. FOR PAIN.    . TRAVOPROST (BAK FREE) 0.004 % OP SOLN Both Eyes Place 1 drop into both eyes at bedtime.    Marland Kitchen VALSARTAN 320 MG PO TABS Oral Take 320 mg by mouth daily.      BP 112/58  Pulse 53  Temp 97.9 F (36.6 C) (Oral)  Ht 5\' 4"  (1.626 m)  Wt 180 lb (81.647 kg)  BMI 30.90 kg/m2  SpO2 97%  Physical Exam  Nursing note and vitals reviewed. Constitutional: She is oriented to person, place, and time. She appears well-developed and well-nourished. No distress.  HENT:  Head: Normocephalic.  Small abrasion and ecchymosis superior to L orbit.   Eyes: Conjunctivae normal and EOM are normal. Pupils are equal, round, and reactive to light. Right eye exhibits no discharge. Left eye exhibits no discharge.       No hyphema.   Neck: Neck supple.       No midline spinal tenderness.  Cardiovascular: Normal rate, regular rhythm and normal heart sounds.  Exam reveals no gallop and no friction rub.   No murmur heard. Pulmonary/Chest: Effort normal and breath sounds normal. No respiratory distress.  Abdominal: Soft. She exhibits no distension. There is no tenderness.  Musculoskeletal: She exhibits no edema and no tenderness.       Arms:      Tenderness in depicted areas.   Neurological: She is alert and oriented to person, place, and time. No cranial nerve deficit. She exhibits normal muscle tone. Coordination normal.  Skin: Skin is warm and dry.  Psychiatric: She has a normal mood and affect. Her behavior is normal. Thought content normal.    ED Course  Procedures (including critical care time)  Labs Reviewed - No data to  display No results found.  Dg Wrist Complete Left  04/29/2012  *RADIOLOGY REPORT*  Clinical Data: Fall.  Left shoulder pain.  Left wrist pain.  LEFT WRIST - COMPLETE 3+ VIEW  Comparison: None.  Findings: There is an old healed fracture of the distal radius. The distal radial articular surface shows neutral tilt.  There is chronic chondrocalcinosis.  There are mild generative changes of the carpus.  No sign of acute fracture.  IMPRESSION: No acute finding.  Old healed distal radial fracture.  Chronic degenerative changes.   Original Report Authenticated By: Thomasenia Sales, M.D.    Ct Head Wo Contrast  04/29/2012  *RADIOLOGY REPORT*  Clinical Data: Fall with trauma to the left side of the head.  CT HEAD WITHOUT CONTRAST  Technique:  Contiguous axial images were obtained from the base of the skull through the vertex without contrast.  Comparison: MRI 03/23/2010.  Findings: The brain shows generalized atrophy.  There are mild chronic small vessel changes within the hemispheric white matter. 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 No sign of acute infarction, mass lesion, hemorrhage, hydrocephalus or extra-axial collection.  No skull fracture.  No fluid in the sinuses, middle ears or mastoids.  There is atherosclerotic calcification of the major vessels at the base of the brain.  IMPRESSION: No acute or traumatic finding.  Mild chronic small vessel disease.   Original Report Authenticated By: Thomasenia Sales, M.D.    Dg Shoulder Left  04/29/2012  *RADIOLOGY REPORT*  Clinical Data: Fall.  Pain.  LEFT SHOULDER - 2+ VIEW  Comparison: None.  Findings: Humeral head is properly located.  No glenoid or humeral fracture.  There is a prominent subacromial spur which could predispose to rotator cuff disease.  There appears to be some rotator cuff calcification.  No AC dislocation.  No clavicle fracture.  Regional ribs are normal.  IMPRESSION: No acute bony finding.  Prominent subacromial spur  that could predispose to rotator cuff disease.  Some chronic rotator cuff calcification is noted.   Original Report Authenticated By: Thomasenia Sales, M.D.     1. Closed head injury   2. Chronic anticoagulation   3. Abrasions of multiple sites   4. Contusion of wrist, left   5. Shoulder pain, left       MDM  85yf presenting after fall. Mechanical  in nature. Did hit her head. No LOC. Nonfocal neuro exam. On xarelto, but head CT neg. Reassuring plain imaging. Plan PRN pain meds. RICE. Outpt fu as needed.        Raeford Razor, MD 05/02/12 [phone]

## 2012-05-14 ENCOUNTER — Other Ambulatory Visit: Payer: Self-pay | Admitting: Orthopedic Surgery

## 2012-05-14 DIAGNOSIS — Z9181 History of falling: Secondary | ICD-10-CM

## 2012-05-14 DIAGNOSIS — M25512 Pain in left shoulder: Secondary | ICD-10-CM

## 2012-05-15 ENCOUNTER — Ambulatory Visit
Admission: RE | Admit: 2012-05-15 | Discharge: 2012-05-15 | Disposition: A | Discharge status: Home / Self Care | Payer: Medicare Other | Source: Ambulatory Visit | Attending: Orthopedic Surgery | Admitting: Orthopedic Surgery

## 2012-05-15 DIAGNOSIS — Z9181 History of falling: Secondary | ICD-10-CM

## 2012-05-15 DIAGNOSIS — M25512 Pain in left shoulder: Secondary | ICD-10-CM

## 2013-03-27 ENCOUNTER — Other Ambulatory Visit: Payer: Self-pay | Admitting: Interventional Cardiology

## 2013-03-27 DIAGNOSIS — Z79899 Other long term (current) drug therapy: Secondary | ICD-10-CM

## 2013-03-27 DIAGNOSIS — I4891 Unspecified atrial fibrillation: Secondary | ICD-10-CM

## 2013-04-23 ENCOUNTER — Other Ambulatory Visit: Payer: Medicare Other

## 2013-06-03 ENCOUNTER — Emergency Department (HOSPITAL_COMMUNITY): Payer: Medicare Other

## 2013-06-03 ENCOUNTER — Inpatient Hospital Stay (HOSPITAL_COMMUNITY)
Admission: EM | Admit: 2013-06-03 | Discharge: 2013-06-05 | DRG: 312 | Disposition: A | Discharge status: Home Health | Payer: Medicare Other | Attending: Internal Medicine | Admitting: Internal Medicine

## 2013-06-03 ENCOUNTER — Encounter (HOSPITAL_COMMUNITY): Payer: Self-pay | Admitting: Emergency Medicine

## 2013-06-03 ENCOUNTER — Inpatient Hospital Stay (HOSPITAL_COMMUNITY): Payer: Medicare Other

## 2013-06-03 DIAGNOSIS — Z96659 Presence of unspecified artificial knee joint: Secondary | ICD-10-CM

## 2013-06-03 DIAGNOSIS — I10 Essential (primary) hypertension: Secondary | ICD-10-CM | POA: Diagnosis present

## 2013-06-03 DIAGNOSIS — W010XXA Fall on same level from slipping, tripping and stumbling without subsequent striking against object, initial encounter: Secondary | ICD-10-CM | POA: Diagnosis present

## 2013-06-03 DIAGNOSIS — I951 Orthostatic hypotension: Principal | ICD-10-CM | POA: Diagnosis present

## 2013-06-03 DIAGNOSIS — S5000XA Contusion of unspecified elbow, initial encounter: Secondary | ICD-10-CM | POA: Diagnosis present

## 2013-06-03 DIAGNOSIS — S53101A Unspecified subluxation of right ulnohumeral joint, initial encounter: Secondary | ICD-10-CM

## 2013-06-03 DIAGNOSIS — K219 Gastro-esophageal reflux disease without esophagitis: Secondary | ICD-10-CM | POA: Diagnosis present

## 2013-06-03 DIAGNOSIS — S5010XA Contusion of unspecified forearm, initial encounter: Secondary | ICD-10-CM | POA: Diagnosis present

## 2013-06-03 DIAGNOSIS — M129 Arthropathy, unspecified: Secondary | ICD-10-CM | POA: Diagnosis present

## 2013-06-03 DIAGNOSIS — I4891 Unspecified atrial fibrillation: Secondary | ICD-10-CM | POA: Diagnosis present

## 2013-06-03 DIAGNOSIS — N390 Urinary tract infection, site not specified: Secondary | ICD-10-CM | POA: Diagnosis present

## 2013-06-03 DIAGNOSIS — S0010XA Contusion of unspecified eyelid and periocular area, initial encounter: Secondary | ICD-10-CM | POA: Diagnosis present

## 2013-06-03 DIAGNOSIS — R55 Syncope and collapse: Secondary | ICD-10-CM | POA: Diagnosis present

## 2013-06-03 DIAGNOSIS — Z7901 Long term (current) use of anticoagulants: Secondary | ICD-10-CM

## 2013-06-03 DIAGNOSIS — R1310 Dysphagia, unspecified: Secondary | ICD-10-CM

## 2013-06-03 DIAGNOSIS — IMO0002 Reserved for concepts with insufficient information to code with codable children: Secondary | ICD-10-CM

## 2013-06-03 DIAGNOSIS — S0990XA Unspecified injury of head, initial encounter: Secondary | ICD-10-CM

## 2013-06-03 DIAGNOSIS — K5730 Diverticulosis of large intestine without perforation or abscess without bleeding: Secondary | ICD-10-CM

## 2013-06-03 DIAGNOSIS — I440 Atrioventricular block, first degree: Secondary | ICD-10-CM

## 2013-06-03 DIAGNOSIS — S5011XA Contusion of right forearm, initial encounter: Secondary | ICD-10-CM | POA: Diagnosis present

## 2013-06-03 DIAGNOSIS — A498 Other bacterial infections of unspecified site: Secondary | ICD-10-CM | POA: Diagnosis present

## 2013-06-03 HISTORY — DX: Atrioventricular block, first degree: I44.0

## 2013-06-03 HISTORY — DX: Dizziness and giddiness: R42

## 2013-06-03 HISTORY — DX: Syncope and collapse: R55

## 2013-06-03 HISTORY — DX: Personal history of other medical treatment: Z92.89

## 2013-06-03 HISTORY — DX: Disorder of lipoprotein metabolism, unspecified: E78.9

## 2013-06-03 HISTORY — DX: Gastro-esophageal reflux disease without esophagitis: K21.9

## 2013-06-03 LAB — URINALYSIS, ROUTINE W REFLEX MICROSCOPIC
Glucose, UA: NEGATIVE mg/dL
Nitrite: POSITIVE — AB
Specific Gravity, Urine: 1.016 (ref 1.005–1.030)
pH: 6 (ref 5.0–8.0)

## 2013-06-03 LAB — TROPONIN I: Troponin I: <0.3 ng/mL (ref <0.30)

## 2013-06-03 LAB — CBC WITH DIFFERENTIAL/PLATELET
Basophils Absolute: 0 10*3/uL (ref 0.0–0.1)
Basophils Relative: 0 % (ref 0–1)
HCT: 37.6 % (ref 36.0–46.0)
Hemoglobin: 12.4 g/dL (ref 12.0–15.0)
Lymphocytes Relative: 23 % (ref 12–46)
MCHC: 33 g/dL (ref 30.0–36.0)
Monocytes Absolute: 1.1 10*3/uL — ABNORMAL HIGH (ref 0.1–1.0)
Neutro Abs: 9.3 10*3/uL — ABNORMAL HIGH (ref 1.7–7.7)
Neutrophils Relative %: 68 % (ref 43–77)
RDW: 13.3 % (ref 11.5–15.5)
WBC: 13.6 10*3/uL — ABNORMAL HIGH (ref 4.0–10.5)

## 2013-06-03 LAB — BASIC METABOLIC PANEL
CO2: 26 mEq/L (ref 19–32)
Chloride: 101 mEq/L (ref 96–112)
Creatinine, Ser: 1.05 mg/dL (ref 0.50–1.10)
GFR calc Af Amer: 55 mL/min — ABNORMAL LOW (ref >90)
Potassium: 4.7 mEq/L (ref 3.5–5.1)

## 2013-06-03 LAB — URINE MICROSCOPIC-ADD ON

## 2013-06-03 MED ORDER — ONDANSETRON HCL 4 MG/2ML IJ SOLN: 4.0000 mg | Freq: Three times a day (TID) | INTRAMUSCULAR | Status: DC | PRN

## 2013-06-03 MED ORDER — SODIUM CHLORIDE 0.9 % IV BOLUS (SEPSIS)
1000.0000 mL | Freq: Once | INTRAVENOUS | Status: AC
  Administered 2013-06-03: 1000 mL via INTRAVENOUS

## 2013-06-03 MED ORDER — ACETAMINOPHEN 325 MG PO TABS
650.0000 mg | ORAL_TABLET | Freq: Once | ORAL | Status: AC
  Administered 2013-06-03: 650 mg via ORAL
  Filled 2013-06-03: qty 2

## 2013-06-03 NOTE — ED Notes (Addendum)
Pt to ED via EMS for near syncope. Pt states she was walking to [facility] and had near syncope and fell on concrete side walk. Pt reports hitting right side of head, face and elbow the ground. Bruise noted at right forehead,eyebrow, cheek, and elbow. Pt c/o right facial and elbow pain but denies headache, neck and back pain. Per EMS, CBG-135, BP-138/82, SpO2-96% room air, EKG-first degree block, Hx of vertigo. No Hx of seizures. 18G left A/C placed by EMS.

## 2013-06-03 NOTE — ED Provider Notes (Signed)
CSN: [id number]     Arrival date & time 06/03/13  1718 History   First MD Initiated Contact with Patient 06/03/13 1748     Chief Complaint  Patient presents with  . Loss of Consciousness   (Consider location/radiation/quality/duration/timing/severity/associated sxs/prior Treatment) HPI Comments: 79 year old female presenting after a fall. She had just stood up to get out of her car, when she fell against the car and to the ground.  She is unsure whether she lost consciousness, but is not know why she fell. Her husband immediately came to her side and notes that she was conscious. She complains of moderate, constant pain in her right shoulder and elbow which is worse with range of motion of the joints.  Patient is a 79 y.o. female presenting with fall.  Fall This is a new problem. The current episode started less than 1 hour ago. Episode frequency: Once. The problem has been resolved. Associated symptoms include headaches. Pertinent negatives include no chest pain, no abdominal pain and no shortness of breath. Nothing aggravates the symptoms. Nothing relieves the symptoms.    Past Medical History  Diagnosis Date  . Hypertension   . Arthritis   . Atrial fibrillation   . Vertigo    Past Surgical History  Procedure Laterality Date  . Cholecystectomy    . Appendectomy    . Abdominal hysterectomy    . Joint replacement      R knee  . Hip fracture surgery    . Ankle fracture surgery     History reviewed. No pertinent family history. History  Substance Use Topics  . Smoking status: Never Smoker   . Smokeless tobacco: Not on file  . Alcohol Use: No   OB History   Grav Para Term Preterm Abortions TAB SAB Ect Mult Living                 Review of Systems  Constitutional: Negative for fever.  HENT: Negative for congestion.   Respiratory: Negative for cough and shortness of breath.   Cardiovascular: Negative for chest pain.  Gastrointestinal: Negative for nausea, vomiting,  abdominal pain and diarrhea.  Neurological: Positive for headaches.  All other systems reviewed and are negative.    Allergies  Celecoxib; Doxycycline; Guanfacine hcl; Lisinopril; Sulfonamide derivatives; and Warfarin sodium  Home Medications   Current Outpatient Rx  Name  Route  Sig  Dispense  Refill  . amLODipine (NORVASC) 5 MG tablet   Oral   Take 5 mg by mouth daily.         . brimonidine (ALPHAGAN P) 0.1 % SOLN   Both Eyes   Place 1 drop into both eyes 2 (two) times daily.         . cholestyramine (QUESTRAN) 4 GM/DOSE powder   Oral   Take 1 g by mouth 2 (two) times daily with a meal.          . clorazepate (TRANXENE) 3.75 MG tablet   Oral   Take 3.75 mg by mouth at bedtime.          . diazepam (VALIUM) 5 MG tablet   Oral   Take 2.5 mg by mouth at bedtime as needed. For anxiety/sleep.         Marland Kitchen estrogens, conjugated, (PREMARIN) 0.3 MG tablet   Oral   Take 0.3 mg by mouth daily.         Marland Kitchen etodolac (LODINE) 400 MG tablet   Oral   Take 400 mg by mouth 3 (  three) times daily.         Marland Kitchen FLUoxetine (PROZAC) 10 MG capsule   Oral   Take 10 mg by mouth daily.          . furosemide (LASIX) 40 MG tablet   Oral   Take 40 mg by mouth daily.         Marland Kitchen latanoprost (XALATAN) 0.005 % ophthalmic solution   Both Eyes   Place 1 drop into both eyes at bedtime.          . pantoprazole (PROTONIX) 40 MG tablet   Oral   Take 40 mg by mouth 2 (two) times daily.         . potassium chloride (K-DUR,KLOR-CON) 10 MEQ tablet   Oral   Take 10 mEq by mouth daily.         . predniSONE (DELTASONE) 1 MG tablet   Oral   Take 2 mg by mouth daily.          . Rivaroxaban (XARELTO) 15 MG TABS tablet   Oral   Take 15 mg by mouth daily.         . sotalol (BETAPACE) 80 MG tablet   Oral   Take 80 mg by mouth 2 (two) times daily.         . traMADol (ULTRAM) 50 MG tablet   Oral   Take 50 mg by mouth 2 (two) times daily as needed. FOR PAIN.         .  Travoprost, BAK Free, (TRAVATAN) 0.004 % SOLN ophthalmic solution   Both Eyes   Place 1 drop into both eyes at bedtime.         . valsartan (DIOVAN) 320 MG tablet   Oral   Take 320 mg by mouth daily.          BP 136/47  Pulse 56  Temp(Src) 98 F (36.7 C) (Oral)  Resp 20  SpO2 96% Physical Exam  Nursing note and vitals reviewed. Constitutional: She is oriented to person, place, and time. She appears well-developed and well-nourished. No distress.  HENT:  Head: Normocephalic. Head is with contusion (right cheek).  Mouth/Throat: Oropharynx is clear and moist.  Eyes: Conjunctivae and EOM are normal. Pupils are equal, round, and reactive to light. No scleral icterus.  Neck: Neck supple.  Cardiovascular: Normal rate, regular rhythm, normal heart sounds and intact distal pulses.   No murmur heard. Pulmonary/Chest: Effort normal and breath sounds normal. No stridor. No respiratory distress. She has no rales.  Abdominal: Soft. Bowel sounds are normal. She exhibits no distension. There is no tenderness.  Musculoskeletal:       Right shoulder: She exhibits tenderness. She exhibits normal range of motion, no crepitus, no deformity, normal pulse and normal strength.       Right elbow: She exhibits decreased range of motion (unable to fully extend) and swelling. She exhibits no deformity and no laceration. Tenderness found.       Right knee: She exhibits ecchymosis (and small abrasion). She exhibits normal range of motion, no swelling and no deformity. No tenderness found.  Neurological: She is alert and oriented to person, place, and time.  Skin: Skin is warm and dry. No rash noted.  Psychiatric: She has a normal mood and affect. Her behavior is normal.    ED Course  Procedures (including critical care time) Labs Review Labs Reviewed  CBC WITH DIFFERENTIAL  BASIC METABOLIC PANEL  URINALYSIS, ROUTINE W REFLEX MICROSCOPIC  TROPONIN I  Imaging Review Dg Shoulder Right  06/03/2013    CLINICAL DATA:  Right shoulder pain and bruising.  EXAM: RIGHT SHOULDER - 2+ VIEW  COMPARISON:  None.  FINDINGS: No evidence of fracture or dislocation. Spurring is seen along the undersurface of the acromion. Radiodensity also seen in the region of the rotator cuff tendon, suspicious calcific tendinitis.  IMPRESSION: No acute findings.  Degenerative spurring of undersurface of acromion and probable calcific rotator cuff tendinitis.   Electronically Signed   By: Myles Rosenthal M.D.   On: 06/03/2013 19:37   Dg Elbow Complete Right  06/03/2013   CLINICAL DATA:  Elbow pain and bruising.  EXAM: RIGHT ELBOW - COMPLETE 3+ VIEW  COMPARISON:  None.  FINDINGS: Moderate soft tissue swelling seen overlying the olecranon process. Small elbow joint effusion seen. No evidence of acute fracture. On 2 oblique views, there is widening of the medial joint space, consistent with transient dislocation or subluxation of the ulna, which is subsequently reduced on the final film. This suggests ligamentous injury.  IMPRESSION: No evidence of fracture identified.  Transient subluxation or dislocation of the ulna, consistent with ligamentous injury.  Small elbow joint effusion.   Electronically Signed   By: Myles Rosenthal M.D.   On: 06/03/2013 19:43   Ct Head Wo Contrast  06/03/2013   CLINICAL DATA:  Syncope, fell on concrete sidewalk striking right side of head and face, right facial bruising and pain  EXAM: CT HEAD WITHOUT CONTRAST  CT CERVICAL SPINE WITHOUT CONTRAST  TECHNIQUE: Multidetector CT imaging of the head and cervical spine was performed following the standard protocol without intravenous contrast. Multiplanar CT image reconstructions of the cervical spine were also generated.  COMPARISON:  CT head 04/29/2012  FINDINGS: CT HEAD FINDINGS  Generalized atrophy.  Normal ventricular morphology.  No midline shift or mass effect.  Mild small vessel chronic ischemic changes of deep cerebral white matter.  No intracranial hemorrhage,  mass lesion or evidence acute infarction.  No extra-axial fluid collections.  Soft tissue hematoma identified lateral to the right orbit extending into the right temporal region.  Bones and sinuses unremarkable.  CT CERVICAL SPINE FINDINGS  Diffuse osseous demineralization.  Disc space narrowing with endplate spur formation at C4-C5 through C6-C7.  Minimal retrolisthesis at C5-C6 and C6-C7.  Minimal anterolisthesis C4-C5.  Multilevel facet degenerative changes.  Vertebral body heights maintained without fracture or additional subluxation.  Prevertebral soft tissues normal thickness.  Calcified granuloma right apex.  Visualized skullbase intact.  IMPRESSION: CT HEAD:  Atrophy with mild small vessel chronic ischemic changes of deep cerebral white matter.  No acute intracranial abnormalities.  CT CERVICAL SPINE:  Multilevel degenerative disc and facet disease changes.  No acute abnormalities.   Electronically Signed   By: Ulyses Southward M.D.   On: 06/03/2013 19:21   Ct Cervical Spine Wo Contrast  06/03/2013   CLINICAL DATA:  Syncope, fell on concrete sidewalk striking right side of head and face, right facial bruising and pain  EXAM: CT HEAD WITHOUT CONTRAST  CT CERVICAL SPINE WITHOUT CONTRAST  TECHNIQUE: Multidetector CT imaging of the head and cervical spine was performed following the standard protocol without intravenous contrast. Multiplanar CT image reconstructions of the cervical spine were also generated.  COMPARISON:  CT head 04/29/2012  FINDINGS: CT HEAD FINDINGS  Generalized atrophy.  Normal ventricular morphology.  No midline shift or mass effect.  Mild small vessel chronic ischemic changes of deep cerebral white matter.  No intracranial hemorrhage, mass lesion or evidence  acute infarction.  No extra-axial fluid collections.  Soft tissue hematoma identified lateral to the right orbit extending into the right temporal region.  Bones and sinuses unremarkable.  CT CERVICAL SPINE FINDINGS  Diffuse osseous  demineralization.  Disc space narrowing with endplate spur formation at C4-C5 through C6-C7.  Minimal retrolisthesis at C5-C6 and C6-C7.  Minimal anterolisthesis C4-C5.  Multilevel facet degenerative changes.  Vertebral body heights maintained without fracture or additional subluxation.  Prevertebral soft tissues normal thickness.  Calcified granuloma right apex.  Visualized skullbase intact.  IMPRESSION: CT HEAD:  Atrophy with mild small vessel chronic ischemic changes of deep cerebral white matter.  No acute intracranial abnormalities.  CT CERVICAL SPINE:  Multilevel degenerative disc and facet disease changes.  No acute abnormalities.   Electronically Signed   By: Ulyses Southward M.D.   On: 06/03/2013 19:21  All radiology studies independently viewed by me.     EKG Interpretation    Date/Time:  Thursday June 03 2013 17:27:35 EST Ventricular Rate:  58 PR Interval:  218 QRS Duration: 96 QT Interval:  490 QTC Calculation: 481 R Axis:   -42 Text Interpretation:  Sinus rhythm Borderline prolonged PR interval Left axis deviation Low voltage, precordial leads RSR' in V1 or V2, right VCD or RVH Consider anterior infarct No significant change was found Confirmed by [facility]  MD, TREY ([id number]) on 06/03/2013 7:34:43 PM            MDM   1. Syncope   2. Head injury, initial encounter   3. Elbow subluxation, right, initial encounter    79 yo female with an apparent syncopal episode follow by a fall, striking her head and right elbow.  On Xarelto.  CT of head and C spine negative.  Right elbow appears to be intermittently subluxed.  Discussed with Dr. Dion Saucier (Orthopedics) via telephone, who recommended splint and CT imaging.  Care transferred to [facility] pending laboratory workup.  Anticipate admission for syncope and head injury on Xarelto.      Candyce Churn, MD 06/03/13 2211

## 2013-06-03 NOTE — ED Provider Notes (Signed)
Patient signed out to me at shift change. Patient with syncopal episode earlier today. She has head injury, elbow injury, dizzy upon standing. She is on xarelto. Patient's lab work is pending. Once back admission for further evaluation.  Results for orders placed during the hospital encounter of 06/03/13  CBC WITH DIFFERENTIAL      Result Value Range   WBC 13.6 (*) 4.0 - 10.5 K/uL   RBC 3.99  3.87 - 5.11 MIL/uL   Hemoglobin 12.4  12.0 - 15.0 g/dL   HCT 16.1  09.6 - 04.5 %   MCV 94.2  78.0 - 100.0 fL   MCH 31.1  26.0 - 34.0 pg   MCHC 33.0  30.0 - 36.0 g/dL   RDW 40.9  81.1 - 91.4 %   Platelets 273  150 - 400 K/uL   Neutrophils Relative % 68  43 - 77 %   Neutro Abs 9.3 (*) 1.7 - 7.7 K/uL   Lymphocytes Relative 23  12 - 46 %   Lymphs Abs 3.1  0.7 - 4.0 K/uL   Monocytes Relative 8  3 - 12 %   Monocytes Absolute 1.1 (*) 0.1 - 1.0 K/uL   Eosinophils Relative 1  0 - 5 %   Eosinophils Absolute 0.2  0.0 - 0.7 K/uL   Basophils Relative 0  0 - 1 %   Basophils Absolute 0.0  0.0 - 0.1 K/uL  BASIC METABOLIC PANEL      Result Value Range   Sodium 139  135 - 145 mEq/L   Potassium 4.7  3.5 - 5.1 mEq/L   Chloride 101  96 - 112 mEq/L   CO2 26  19 - 32 mEq/L   Glucose, Bld 109 (*) 70 - 99 mg/dL   BUN 25 (*) 6 - 23 mg/dL   Creatinine, Ser 7.82  0.50 - 1.10 mg/dL   Calcium 8.9  8.4 - 95.6 mg/dL   GFR calc non Af Amer 47 (*) >90 mL/min   GFR calc Af Amer 55 (*) >90 mL/min  URINALYSIS, ROUTINE W REFLEX MICROSCOPIC      Result Value Range   Color, Urine YELLOW  YELLOW   APPearance CLEAR  CLEAR   Specific Gravity, Urine 1.016  1.005 - 1.030   pH 6.0  5.0 - 8.0   Glucose, UA NEGATIVE  NEGATIVE mg/dL   Hgb urine dipstick SMALL (*) NEGATIVE   Bilirubin Urine NEGATIVE  NEGATIVE   Ketones, ur NEGATIVE  NEGATIVE mg/dL   Protein, ur NEGATIVE  NEGATIVE mg/dL   Urobilinogen, UA 0.2  0.0 - 1.0 mg/dL   Nitrite POSITIVE (*) NEGATIVE   Leukocytes, UA SMALL (*) NEGATIVE  TROPONIN I      Result Value Range    Troponin I <0.30  <0.30 ng/mL  URINE MICROSCOPIC-ADD ON      Result Value Range   WBC, UA 3-6  <3 WBC/hpf   RBC / HPF 0-2  <3 RBC/hpf   Bacteria, UA MANY (*) RARE   Casts HYALINE CASTS (*) NEGATIVE   Dg Shoulder Right  06/03/2013   CLINICAL DATA:  Right shoulder pain and bruising.  EXAM: RIGHT SHOULDER - 2+ VIEW  COMPARISON:  None.  FINDINGS: No evidence of fracture or dislocation. Spurring is seen along the undersurface of the acromion. Radiodensity also seen in the region of the rotator cuff tendon, suspicious calcific tendinitis.  IMPRESSION: No acute findings.  Degenerative spurring of undersurface of acromion and probable calcific rotator cuff tendinitis.   Electronically  Signed   By: Myles Rosenthal M.D.   On: 06/03/2013 19:37   Dg Elbow Complete Right  06/03/2013   CLINICAL DATA:  Elbow pain and bruising.  EXAM: RIGHT ELBOW - COMPLETE 3+ VIEW  COMPARISON:  None.  FINDINGS: Moderate soft tissue swelling seen overlying the olecranon process. Small elbow joint effusion seen. No evidence of acute fracture. On 2 oblique views, there is widening of the medial joint space, consistent with transient dislocation or subluxation of the ulna, which is subsequently reduced on the final film. This suggests ligamentous injury.  IMPRESSION: No evidence of fracture identified.  Transient subluxation or dislocation of the ulna, consistent with ligamentous injury.  Small elbow joint effusion.   Electronically Signed   By: Myles Rosenthal M.D.   On: 06/03/2013 19:43   Ct Head Wo Contrast  06/03/2013   CLINICAL DATA:  Syncope, fell on concrete sidewalk striking right side of head and face, right facial bruising and pain  EXAM: CT HEAD WITHOUT CONTRAST  CT CERVICAL SPINE WITHOUT CONTRAST  TECHNIQUE: Multidetector CT imaging of the head and cervical spine was performed following the standard protocol without intravenous contrast. Multiplanar CT image reconstructions of the cervical spine were also generated.  COMPARISON:   CT head 04/29/2012  FINDINGS: CT HEAD FINDINGS  Generalized atrophy.  Normal ventricular morphology.  No midline shift or mass effect.  Mild small vessel chronic ischemic changes of deep cerebral white matter.  No intracranial hemorrhage, mass lesion or evidence acute infarction.  No extra-axial fluid collections.  Soft tissue hematoma identified lateral to the right orbit extending into the right temporal region.  Bones and sinuses unremarkable.  CT CERVICAL SPINE FINDINGS  Diffuse osseous demineralization.  Disc space narrowing with endplate spur formation at C4-C5 through C6-C7.  Minimal retrolisthesis at C5-C6 and C6-C7.  Minimal anterolisthesis C4-C5.  Multilevel facet degenerative changes.  Vertebral body heights maintained without fracture or additional subluxation.  Prevertebral soft tissues normal thickness.  Calcified granuloma right apex.  Visualized skullbase intact.  IMPRESSION: CT HEAD:  Atrophy with mild small vessel chronic ischemic changes of deep cerebral white matter.  No acute intracranial abnormalities.  CT CERVICAL SPINE:  Multilevel degenerative disc and facet disease changes.  No acute abnormalities.   Electronically Signed   By: Ulyses Southward M.D.   On: 06/03/2013 19:21   Ct Cervical Spine Wo Contrast  06/03/2013   CLINICAL DATA:  Syncope, fell on concrete sidewalk striking right side of head and face, right facial bruising and pain  EXAM: CT HEAD WITHOUT CONTRAST  CT CERVICAL SPINE WITHOUT CONTRAST  TECHNIQUE: Multidetector CT imaging of the head and cervical spine was performed following the standard protocol without intravenous contrast. Multiplanar CT image reconstructions of the cervical spine were also generated.  COMPARISON:  CT head 04/29/2012  FINDINGS: CT HEAD FINDINGS  Generalized atrophy.  Normal ventricular morphology.  No midline shift or mass effect.  Mild small vessel chronic ischemic changes of deep cerebral white matter.  No intracranial hemorrhage, mass lesion or evidence  acute infarction.  No extra-axial fluid collections.  Soft tissue hematoma identified lateral to the right orbit extending into the right temporal region.  Bones and sinuses unremarkable.  CT CERVICAL SPINE FINDINGS  Diffuse osseous demineralization.  Disc space narrowing with endplate spur formation at C4-C5 through C6-C7.  Minimal retrolisthesis at C5-C6 and C6-C7.  Minimal anterolisthesis C4-C5.  Multilevel facet degenerative changes.  Vertebral body heights maintained without fracture or additional subluxation.  Prevertebral soft tissues normal  thickness.  Calcified granuloma right apex.  Visualized skullbase intact.  IMPRESSION: CT HEAD:  Atrophy with mild small vessel chronic ischemic changes of deep cerebral white matter.  No acute intracranial abnormalities.  CT CERVICAL SPINE:  Multilevel degenerative disc and facet disease changes.  No acute abnormalities.   Electronically Signed   By: Ulyses Southward M.D.   On: 06/03/2013 19:21    9:31 PM Spoke with [facility]. Will admit. CT elbow pending. Unable to get orthostatic vs, unable to stand up due to dizziness.   Lottie Mussel, PA-C 06/03/13 2131

## 2013-06-03 NOTE — ED Notes (Signed)
Ortho called for arm splint right

## 2013-06-03 NOTE — Plan of Care (Signed)
Problem: Consults Goal: General Medical Patient Education See Patient Education Module for specific education. Outcome: Progressing Pt admitted with syncopal episode.

## 2013-06-03 NOTE — Progress Notes (Signed)
Orthopedic Tech Progress Note Patient Details:  Srishti A Piltz Oct 14, 1926 [id number]  Ortho Devices Type of Ortho Device: Ace wrap;Arm sling;Post (long arm) splint Ortho Device/Splint Location: RUE Ortho Device/Splint Interventions: Ordered;Application   Jennye Moccasin 06/03/2013, 9:31 PM

## 2013-06-03 NOTE — ED Provider Notes (Signed)
Medical screening examination/treatment/procedure(s) were performed by non-physician practitioner and as supervising physician I was immediately available for consultation/collaboration.  EKG Interpretation    Date/Time:  Thursday June 03 2013 17:27:35 EST Ventricular Rate:  58 PR Interval:  218 QRS Duration: 96 QT Interval:  490 QTC Calculation: 481 R Axis:   -42 Text Interpretation:  Sinus rhythm Borderline prolonged PR interval Left axis deviation Low voltage, precordial leads RSR' in V1 or V2, right VCD or RVH Consider anterior infarct No significant change was found Confirmed by [facility]  MD, TREY ([id number]) on 06/03/2013 7:34:43 PM              Gwyneth Sprout, MD 06/03/13 2141

## 2013-06-04 ENCOUNTER — Encounter (HOSPITAL_COMMUNITY): Payer: Self-pay | Admitting: Internal Medicine

## 2013-06-04 DIAGNOSIS — I10 Essential (primary) hypertension: Secondary | ICD-10-CM

## 2013-06-04 DIAGNOSIS — R55 Syncope and collapse: Secondary | ICD-10-CM

## 2013-06-04 DIAGNOSIS — I4891 Unspecified atrial fibrillation: Secondary | ICD-10-CM | POA: Diagnosis present

## 2013-06-04 DIAGNOSIS — S5011XA Contusion of right forearm, initial encounter: Secondary | ICD-10-CM | POA: Diagnosis present

## 2013-06-04 DIAGNOSIS — N390 Urinary tract infection, site not specified: Secondary | ICD-10-CM | POA: Diagnosis present

## 2013-06-04 LAB — CBC
HCT: 34.8 % — ABNORMAL LOW (ref 36.0–46.0)
Hemoglobin: 11.3 g/dL — ABNORMAL LOW (ref 12.0–15.0)
MCHC: 32.5 g/dL (ref 30.0–36.0)
Platelets: 257 10*3/uL (ref 150–400)
RDW: 13.2 % (ref 11.5–15.5)
WBC: 9.8 10*3/uL (ref 4.0–10.5)

## 2013-06-04 LAB — COMPREHENSIVE METABOLIC PANEL
ALT: 21 U/L (ref 0–35)
Alkaline Phosphatase: 76 U/L (ref 39–117)
BUN: 20 mg/dL (ref 6–23)
CO2: 26 mEq/L (ref 19–32)
Calcium: 8.6 mg/dL (ref 8.4–10.5)
Creatinine, Ser: 0.88 mg/dL (ref 0.50–1.10)
GFR calc Af Amer: 68 mL/min — ABNORMAL LOW (ref >90)
GFR calc non Af Amer: 59 mL/min — ABNORMAL LOW (ref >90)
Glucose, Bld: 100 mg/dL — ABNORMAL HIGH (ref 70–99)
Total Protein: 6.2 g/dL (ref 6.0–8.3)

## 2013-06-04 LAB — TROPONIN I
Troponin I: <0.3 ng/mL (ref <0.30)
Troponin I: <0.3 ng/mL (ref <0.30)

## 2013-06-04 MED ORDER — LATANOPROST 0.005 % OP SOLN
1.0000 [drp] | Freq: Every day | OPHTHALMIC | Status: DC
  Administered 2013-06-04 (×2): 1 [drp] via OPHTHALMIC
  Filled 2013-06-04: qty 2.5

## 2013-06-04 MED ORDER — PANTOPRAZOLE SODIUM 40 MG PO TBEC
40.0000 mg | DELAYED_RELEASE_TABLET | Freq: Two times a day (BID) | ORAL | Status: DC
  Administered 2013-06-04 – 2013-06-05 (×4): 40 mg via ORAL
  Filled 2013-06-04 (×2): qty 1

## 2013-06-04 MED ORDER — ONDANSETRON HCL 4 MG PO TABS: 4.0000 mg | ORAL_TABLET | Freq: Four times a day (QID) | ORAL | Status: DC | PRN

## 2013-06-04 MED ORDER — SODIUM CHLORIDE 0.9 % IJ SOLN
3.0000 mL | Freq: Two times a day (BID) | INTRAMUSCULAR | Status: DC
  Administered 2013-06-04 – 2013-06-05 (×4): 3 mL via INTRAVENOUS

## 2013-06-04 MED ORDER — IRBESARTAN 150 MG PO TABS
150.0000 mg | ORAL_TABLET | Freq: Every day | ORAL | Status: DC
  Administered 2013-06-05: 150 mg via ORAL
  Filled 2013-06-04: qty 1

## 2013-06-04 MED ORDER — ONDANSETRON HCL 4 MG/2ML IJ SOLN: 4.0000 mg | Freq: Four times a day (QID) | INTRAMUSCULAR | Status: DC | PRN

## 2013-06-04 MED ORDER — TRAMADOL HCL 50 MG PO TABS: 50.0000 mg | ORAL_TABLET | Freq: Two times a day (BID) | ORAL | Status: DC | PRN

## 2013-06-04 MED ORDER — PREDNISONE 1 MG PO TABS
2.0000 mg | ORAL_TABLET | Freq: Every day | ORAL | Status: DC
Start: 2013-06-04 — End: 2013-06-05
  Administered 2013-06-04 – 2013-06-05 (×2): 2 mg via ORAL
  Filled 2013-06-04 (×2): qty 2

## 2013-06-04 MED ORDER — CLORAZEPATE DIPOTASSIUM 3.75 MG PO TABS
3.7500 mg | ORAL_TABLET | Freq: Every day | ORAL | Status: DC
  Administered 2013-06-04 (×2): 3.75 mg via ORAL
  Filled 2013-06-04 (×2): qty 1

## 2013-06-04 MED ORDER — ETODOLAC 400 MG PO TABS
400.0000 mg | ORAL_TABLET | Freq: Three times a day (TID) | ORAL | Status: DC
  Administered 2013-06-04 – 2013-06-05 (×4): 400 mg via ORAL
  Filled 2013-06-04 (×6): qty 1

## 2013-06-04 MED ORDER — LATANOPROST 0.005 % OP SOLN: 1.0000 [drp] | Freq: Every day | OPHTHALMIC | Status: DC

## 2013-06-04 MED ORDER — DEXTROSE 5 % IV SOLN
1.0000 g | INTRAVENOUS | Status: DC
  Administered 2013-06-04: 1 g via INTRAVENOUS
  Filled 2013-06-04 (×2): qty 10

## 2013-06-04 MED ORDER — SODIUM CHLORIDE 0.9 % IV SOLN
INTRAVENOUS | Status: AC
  Administered 2013-06-04: 02:00:00 via INTRAVENOUS

## 2013-06-04 MED ORDER — ACETAMINOPHEN 650 MG RE SUPP: 650.0000 mg | Freq: Four times a day (QID) | RECTAL | Status: DC | PRN

## 2013-06-04 MED ORDER — CHOLESTYRAMINE 4 GM/DOSE PO POWD
1.0000 g | Freq: Two times a day (BID) | ORAL | Status: DC
Start: 2013-06-04 — End: 2013-06-04

## 2013-06-04 MED ORDER — DIAZEPAM 5 MG PO TABS: 2.5000 mg | ORAL_TABLET | Freq: Every evening | ORAL | Status: DC | PRN

## 2013-06-04 MED ORDER — BRIMONIDINE TARTRATE 0.2 % OP SOLN
1.0000 [drp] | Freq: Three times a day (TID) | OPHTHALMIC | Status: DC
  Administered 2013-06-04 – 2013-06-05 (×4): 1 [drp] via OPHTHALMIC
  Filled 2013-06-04: qty 5

## 2013-06-04 MED ORDER — RIVAROXABAN 15 MG PO TABS
15.0000 mg | ORAL_TABLET | Freq: Every day | ORAL | Status: DC
  Filled 2013-06-04 (×2): qty 1

## 2013-06-04 MED ORDER — SOTALOL HCL 80 MG PO TABS
80.0000 mg | ORAL_TABLET | Freq: Two times a day (BID) | ORAL | Status: DC
  Administered 2013-06-04 – 2013-06-05 (×4): 80 mg via ORAL
  Filled 2013-06-04 (×5): qty 1

## 2013-06-04 MED ORDER — AMLODIPINE BESYLATE 5 MG PO TABS
5.0000 mg | ORAL_TABLET | Freq: Every day | ORAL | Status: DC
  Administered 2013-06-04: 5 mg via ORAL
  Filled 2013-06-04 (×2): qty 1

## 2013-06-04 MED ORDER — IRBESARTAN 300 MG PO TABS
300.0000 mg | ORAL_TABLET | Freq: Every day | ORAL | Status: DC
  Administered 2013-06-04: 300 mg via ORAL
  Filled 2013-06-04: qty 1

## 2013-06-04 MED ORDER — ACETAMINOPHEN 325 MG PO TABS: 650.0000 mg | ORAL_TABLET | Freq: Four times a day (QID) | ORAL | Status: DC | PRN

## 2013-06-04 MED ORDER — FLUOXETINE HCL 10 MG PO CAPS
10.0000 mg | ORAL_CAPSULE | Freq: Every day | ORAL | Status: DC
  Administered 2013-06-04 – 2013-06-05 (×2): 10 mg via ORAL
  Filled 2013-06-04 (×2): qty 1

## 2013-06-04 MED ORDER — CHOLESTYRAMINE 4 G PO PACK
1.0000 g | PACK | Freq: Two times a day (BID) | ORAL | Status: DC
  Administered 2013-06-04: 2 g via ORAL
  Filled 2013-06-04 (×5): qty 1
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
## 2013-06-04 NOTE — Progress Notes (Addendum)
*  PRELIMINARY RESULTS* Vascular Ultrasound Carotid Duplex (Doppler) has been completed.   Study was technically limited due to poor patient cooperation/restricted movement, and patient anatomy. Findings suggest 1-39% internal carotid artery stenosis bilaterally. The left vertebral artery is patent with antegrade flow.  06/04/2013 3:05 PM Gertie Fey, RVT, RDCS, RDMS

## 2013-06-04 NOTE — Progress Notes (Addendum)
[facility]                                                                                Patient Demographics  Kristen Garrison, is a 79 y.o. female, DOB - 19-Dec-1926, ZOX:[id number]  Admit date - 06/03/2013   Admitting Physician Eduard Clos, MD  Outpatient Primary MD for the patient is GREEN, Lorenda Ishihara, MD  LOS - 1   Chief Complaint  Patient presents with  . Near Syncope        Assessment & Plan    Syncope - -ve orthostatics, I have high suspicion that patient had dysrhythmia, the patient passed out completely and had facial injury, she is on sotalol. Has underlying atrial fibrillation. Continue telemetry monitoring, cycle troponin, EKG was unremarkable, will request cardiology to evaluate will likely require loop versus event monitor.     Atrial fibrillation presently rate controlled - continue present medications and patient is on xarelto and sotalol. Telemetry monitoring, her facial bruising has improved and she wants to continue on xaralto not hold it. We will continue cautiously.     Right periorbital hematoma - improving monitor on xaralto     Right elbow - evulsion fracture injury noted on CT scan, requested Dr. Lajoyce Corners to evaluate the patient. Is on compressive bandage which will be continued     Hypertension - stable on Norvasc and sotalol continue.     H/O arthritis. Chronically on prednisone continue.     UTI - IV rocephin x 3 days      Code Status: Full  Family Communication: Husband  Disposition Plan: Home   Procedures CT Head, Ct R.elbow, Echo   Consults Cards,Ortho   Medications  Scheduled Meds: . amLODipine  5 mg Oral Daily  . brimonidine  1 drop Both Eyes TID  . cholestyramine  2 g Oral BID WC  . clorazepate  3.75 mg Oral QHS  . etodolac  400 mg Oral TID  . FLUoxetine  10 mg Oral Daily  . irbesartan  300 mg Oral Daily  . latanoprost  1 drop Both Eyes QHS  . pantoprazole  40 mg Oral BID  . predniSONE  2 mg  Oral Daily  . Rivaroxaban  15 mg Oral Daily  . sodium chloride  3 mL Intravenous Q12H  . sotalol  80 mg Oral BID   Continuous Infusions:  PRN Meds:.acetaminophen, acetaminophen, diazepam, ondansetron (ZOFRAN) IV, ondansetron, traMADol  DVT Prophylaxis  Xaralto  Lab Results  Component Value Date   PLT 257 06/04/2013    Antibiotics     Anti-infectives   None          Subjective:   Kristen Garrison today has, No headache, No chest pain, No abdominal pain - No Nausea, No new weakness tingling or numbness, No Cough - SOB.    Objective:   Filed Vitals:   06/04/13 0451 06/04/13 0930 06/04/13 0931 06/04/13 0932  BP: 141/47 126/41 137/60 136/49  Pulse: 63 70 78 79  Temp: 99.1 F (37.3 C)     TempSrc: Oral     Resp: 18     Height:      Weight: 83.462 kg (184 lb)  SpO2: 98%       Wt Readings from Last 3 Encounters:  06/04/13 83.462 kg (184 lb)  04/29/12 81.647 kg (180 lb)  04/01/08 78.472 kg (173 lb)     Intake/Output Summary (Last 24 hours) at 06/04/13 1210 Last data filed at 06/04/13 6962  Gross per 24 hour  Intake    240 ml  Output    600 ml  Net   -360 ml    Exam Awake Alert, Oriented X 3, No new F.N deficits, Normal affect [place].AT,PERRAL Supple Neck,No JVD, No cervical lymphadenopathy appriciated.  Symmetrical Chest wall movement, Good air movement bilaterally, CTAB RRR,No Gallops,Rubs or new Murmurs, No Parasternal Heave +ve B.Sounds, Abd Soft, Non tender, No organomegaly appriciated, No rebound - guarding or rigidity. No Cyanosis, Clubbing or edema, No new Rash or bruise , except R.periorbital-facial bruise R.elbow in bandage   Data Review   Micro Results No results found for this or any previous visit (from the past 240 hour(s)).  Radiology Reports Dg Shoulder Right  06/03/2013   CLINICAL DATA:  Right shoulder pain and bruising.  EXAM: RIGHT SHOULDER - 2+ VIEW  COMPARISON:  None.  FINDINGS: No evidence of fracture or dislocation. Spurring is  seen along the undersurface of the acromion. Radiodensity also seen in the region of the rotator cuff tendon, suspicious calcific tendinitis.  IMPRESSION: No acute findings.  Degenerative spurring of undersurface of acromion and probable calcific rotator cuff tendinitis.   Electronically Signed   By: Myles Rosenthal M.D.   On: 06/03/2013 19:37   Dg Elbow Complete Right  06/03/2013   CLINICAL DATA:  Elbow pain and bruising.  EXAM: RIGHT ELBOW - COMPLETE 3+ VIEW  COMPARISON:  None.  FINDINGS: Moderate soft tissue swelling seen overlying the olecranon process. Small elbow joint effusion seen. No evidence of acute fracture. On 2 oblique views, there is widening of the medial joint space, consistent with transient dislocation or subluxation of the ulna, which is subsequently reduced on the final film. This suggests ligamentous injury.  IMPRESSION: No evidence of fracture identified.  Transient subluxation or dislocation of the ulna, consistent with ligamentous injury.  Small elbow joint effusion.   Electronically Signed   By: Myles Rosenthal M.D.   On: 06/03/2013 19:43   Ct Head Wo Contrast  06/03/2013   CLINICAL DATA:  Syncope, fell on concrete sidewalk striking right side of head and face, right facial bruising and pain  EXAM: CT HEAD WITHOUT CONTRAST  CT CERVICAL SPINE WITHOUT CONTRAST  TECHNIQUE: Multidetector CT imaging of the head and cervical spine was performed following the standard protocol without intravenous contrast. Multiplanar CT image reconstructions of the cervical spine were also generated.  COMPARISON:  CT head 04/29/2012  FINDINGS: CT HEAD FINDINGS  Generalized atrophy.  Normal ventricular morphology.  No midline shift or mass effect.  Mild small vessel chronic ischemic changes of deep cerebral white matter.  No intracranial hemorrhage, mass lesion or evidence acute infarction.  No extra-axial fluid collections.  Soft tissue hematoma identified lateral to the right orbit extending into the right  temporal region.  Bones and sinuses unremarkable.  CT CERVICAL SPINE FINDINGS  Diffuse osseous demineralization.  Disc space narrowing with endplate spur formation at C4-C5 through C6-C7.  Minimal retrolisthesis at C5-C6 and C6-C7.  Minimal anterolisthesis C4-C5.  Multilevel facet degenerative changes.  Vertebral body heights maintained without fracture or additional subluxation.  Prevertebral soft tissues normal thickness.  Calcified granuloma right apex.  Visualized skullbase intact.  IMPRESSION: CT  HEAD:  Atrophy with mild small vessel chronic ischemic changes of deep cerebral white matter.  No acute intracranial abnormalities.  CT CERVICAL SPINE:  Multilevel degenerative disc and facet disease changes.  No acute abnormalities.   Electronically Signed   By: Ulyses Southward M.D.   On: 06/03/2013 19:21   Ct Cervical Spine Wo Contrast  06/03/2013   CLINICAL DATA:  Syncope, fell on concrete sidewalk striking right side of head and face, right facial bruising and pain  EXAM: CT HEAD WITHOUT CONTRAST  CT CERVICAL SPINE WITHOUT CONTRAST  TECHNIQUE: Multidetector CT imaging of the head and cervical spine was performed following the standard protocol without intravenous contrast. Multiplanar CT image reconstructions of the cervical spine were also generated.  COMPARISON:  CT head 04/29/2012  FINDINGS: CT HEAD FINDINGS  Generalized atrophy.  Normal ventricular morphology.  No midline shift or mass effect.  Mild small vessel chronic ischemic changes of deep cerebral white matter.  No intracranial hemorrhage, mass lesion or evidence acute infarction.  No extra-axial fluid collections.  Soft tissue hematoma identified lateral to the right orbit extending into the right temporal region.  Bones and sinuses unremarkable.  CT CERVICAL SPINE FINDINGS  Diffuse osseous demineralization.  Disc space narrowing with endplate spur formation at C4-C5 through C6-C7.  Minimal retrolisthesis at C5-C6 and C6-C7.  Minimal anterolisthesis  C4-C5.  Multilevel facet degenerative changes.  Vertebral body heights maintained without fracture or additional subluxation.  Prevertebral soft tissues normal thickness.  Calcified granuloma right apex.  Visualized skullbase intact.  IMPRESSION: CT HEAD:  Atrophy with mild small vessel chronic ischemic changes of deep cerebral white matter.  No acute intracranial abnormalities.  CT CERVICAL SPINE:  Multilevel degenerative disc and facet disease changes.  No acute abnormalities.   Electronically Signed   By: Ulyses Southward M.D.   On: 06/03/2013 19:21   Ct Elbow Right W/o Cm  06/04/2013   CLINICAL DATA:  Fall with elbow injury, bruising, and pain.  EXAM: CT OF THE RIGHT ELBOW WITHOUT CONTRAST  TECHNIQUE: Multidetector CT imaging was performed according to the standard protocol. Multiplanar CT image reconstructions were also generated.  COMPARISON:  None.  FINDINGS: 3 x 2 x 2 mm free osteochondral fragment posteriorly within the elbow joint, image 25 of series [id number]  0.8 x 0.3 x 0.4 cm bony structure along the proximal- medial margin of the olecranon, image 19 of series [id number], seems well corticated but there is an adjacent cortical defect in the olecranon on image 58 of series [id number], and accordingly the appearance is suspicious for avulsion at the long head triceps attachment. Injury of the adjacent ulnar nerve is not readily excluded, and the medial cutaneous nerve of the forearm (posterior branch) is obscured by surrounding subcutaneous edema.  The triceps is not completely avulsed.  There is a small ossific structure proximally in the common extensor tendon which appears well corticated and likely chronic.  Extensive subcutaneous bruising posterior medially along the elbow and along the superficial margins of the flexor carpi ulnaris and brachialis muscles.  IMPRESSION: 1. Suspected small bony avulsion of the olecranon at the attachment of the long head of the triceps. Cannot exclude injury of the adjacent ulnar  nerve -correlate with in the ulnar neuropathy. Extensive overlying bruising in the posterior medial elbow region. 2. Small free osteochondral fragment is loose within the elbow joint. This could be acute or chronic -donor site not obvious. 3. Chronic appearing small ossifications in the common flexor tendon. 4. Extensive edema along  the common extensor muscular components, possibly hematoma.   Electronically Signed   By: Herbie Baltimore M.D.   On: 06/04/2013 08:05   Dg Knee Complete 4 Views Right  06/03/2013   CLINICAL DATA:  Fall with pain and bruising.  EXAM: RIGHT KNEE - COMPLETE 4+ VIEW  COMPARISON:  None.  FINDINGS: Status post total knee arthroplasty. No acute hardware complication. Remote incompletely imaged femoral shaft fracture. No joint effusion.  IMPRESSION: Right total knee arthroplasty, without acute osseous finding.   Electronically Signed   By: Jeronimo Greaves M.D.   On: 06/03/2013 22:00    CBC  Recent Labs Lab 06/03/13 2010 06/04/13 0412  WBC 13.6* 9.8  HGB 12.4 11.3*  HCT 37.6 34.8*  PLT 273 257  MCV 94.2 92.8  MCH 31.1 30.1  MCHC 33.0 32.5  RDW 13.3 13.2  LYMPHSABS 3.1  --   MONOABS 1.1*  --   EOSABS 0.2  --   BASOSABS 0.0  --     Chemistries   Recent Labs Lab 06/03/13 2010 06/04/13 0412  NA 139 137  K 4.7 4.1  CL 101 101  CO2 26 26  GLUCOSE 109* 100*  BUN 25* 20  CREATININE 1.05 0.88  CALCIUM 8.9 8.6  AST  --  63*  ALT  --  21  ALKPHOS  --  76  BILITOT  --  0.5   ------------------------------------------------------------------------------------------------------------------ estimated creatinine clearance is 49.7 ml/min (by C-G formula based on Cr of 0.88). ------------------------------------------------------------------------------------------------------------------ No results found for this basename: HGBA1C,  in the last 72  hours ------------------------------------------------------------------------------------------------------------------ No results found for this basename: CHOL, HDL, LDLCALC, TRIG, CHOLHDL, LDLDIRECT,  in the last 72 hours ------------------------------------------------------------------------------------------------------------------ No results found for this basename: TSH, T4TOTAL, FREET3, T3FREE, THYROIDAB,  in the last 72 hours ------------------------------------------------------------------------------------------------------------------ No results found for this basename: VITAMINB12, FOLATE, FERRITIN, TIBC, IRON, RETICCTPCT,  in the last 72 hours  Coagulation profile No results found for this basename: INR, PROTIME,  in the last 168 hours  No results found for this basename: DDIMER,  in the last 72 hours  Cardiac Enzymes  Recent Labs Lab 06/03/13 2010  TROPONINI <0.30   ------------------------------------------------------------------------------------------------------------------ No components found with this basename: POCBNP,      Time Spent in minutes  45   SINGH,PRASHANT K M.D on 06/04/2013 at 12:10 PM  Between 7am to 7pm - Pager - [phone]  After 7pm go to [email or web address] - password [email or web address]  And look for the night coverage person covering for me after hours  [facility] Office  [phone]

## 2013-06-04 NOTE — Progress Notes (Signed)
Echo Lab  2D Echocardiogram completed.  Pranavi Aure L Maria Coin, RDCS 06/04/2013 12:34 PM   
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
## 2013-06-04 NOTE — Evaluation (Signed)
Physical Therapy Evaluation Patient Details Name: Kristen Garrison MRN: [id number] DOB: [birth year] Today's Date: 06/04/2013 Time: 4010-2725 PT Time Calculation (min): 50 min  PT Assessment / Plan / Recommendation History of Present Illness  Pt admit with syncope, afib and evulsion fracture right elbow.    Clinical Impression  Pt admitted with above. Pt currently with functional limitations due to the deficits listed below (see PT Problem List). Pt and husband live at home and husband confident they will be fine at home again.  This PT concerned as pt needs assist to stand which husband states is premorbid as well as assist to ambulate at present as her balance is impaired.  Husband uses a cane himself.  Weight bearing for right elbow has not been clarified as ortho MD has not consulted.  Feel pt might be able to use PFRW if MD allows some weight bearing on forearm.  Just don't know yet if he will allow.  If they go home and no weight can be on that arm, recommend 3N1 and wheelchair.  Will need HHPT and transition to Outpt when appropriate.    Pt will benefit from skilled PT to increase their independence and safety with mobility to allow discharge to the venue listed below.     PT Assessment  Patient needs continued PT services    Follow Up Recommendations  Home health PT;Outpatient PT (HH with transition to Outpt - can go home at wheelchair leve)        Barriers to Discharge Decreased caregiver support (husband uses cane)      Equipment Recommendations  3in1 (PT);Other (comment) (? Platform and RW if MD agrees and if MD does not allow weight bearing will need wheelchair)         Frequency Min 3X/week    Precautions / Restrictions Precautions Precautions: Fall Required Braces or Orthoses: Sling Restrictions Weight Bearing Restrictions: No Other Position/Activity Restrictions: No weight bearing specified - left note for MD to clarify.     Pertinent Vitals/Pain VSS, no pain       Mobility  Bed Mobility Bed Mobility: Rolling Right;Right Sidelying to Sit;Sitting - Scoot to Edge of Bed Rolling Right: 3: Mod assist Right Sidelying to Sit: 3: Mod assist;With rails;HOB elevated Sitting - Scoot to Edge of Bed: 3: Mod assist Details for Bed Mobility Assistance: Pt assisted to EOB with mod assist and cues.  Pt tends to lean posteriorly.  Transfers Transfers: Sit to Stand;Stand to Sit;Stand Pivot Transfers Sit to Stand: 3: Mod assist;With upper extremity assist;From bed Stand to Sit: 3: Mod assist;With upper extremity assist;With armrests;To chair/3-in-1 Stand Pivot Transfers: Not tested (comment) Details for Transfer Assistance: Needed cues for hand placement.  Pt struggles to get feet placed exactly where she wants them to stand and takes several attempts prior to achieving upright stance.  Needs steadying assist once up with pt widening her BOs once up to incr steadiness.  Pt also needs assist to control descent into chair as well.   Ambulation/Gait Ambulation/Gait Assistance: 4: Min assist Ambulation Distance (Feet): 56 Feet Assistive device: 1 person hand held assist Ambulation/Gait Assistance Details: Pt ambulated in room per pt request to door and back.  Then once settled pt states she has to go to bathroom therefore ambulated again to bathroom and back.  Unsteady on feet with shuffle steps.  Needed steadying assist throghout.  Ultimately feel a PFRW would help pt but pt weight bearing status needs to be clarified by MD.  Otherwise, pt should  use wheelchair in the home.   Gait Pattern: Step-to pattern;Decreased stride length;Decreased step length - right;Decreased step length - left;Shuffle;Wide base of support Gait velocity: decreased Stairs: No Wheelchair Mobility Wheelchair Mobility: No    Exercises General Exercises - Lower Extremity Ankle Circles/Pumps: 10 reps;AROM;Both;Supine Long Arc Quad: AROM;Both;10 reps;Seated   PT Diagnosis: Generalized weakness   PT Problem List: Decreased activity tolerance;Decreased balance;Decreased mobility;Decreased knowledge of use of DME;Decreased safety awareness;Decreased knowledge of precautions PT Treatment Interventions: DME instruction;Gait training;Functional mobility training;Therapeutic activities;Therapeutic exercise;Balance training;Patient/family education     PT Goals(Current goals can be found in the care plan section) Acute Rehab PT Goals Patient Stated Goal: to go home PT Goal Formulation: With patient Time For Goal Achievement: 06/11/13 Potential to Achieve Goals: Good  Visit Information  Last PT Received On: 06/04/13 Assistance Needed: +1 History of Present Illness: Pt admit with syncope, afib and evulsion fracture right elbow.         Prior Functioning  Home Living Family/patient expects to be discharged to:: Private residence Living Arrangements: Spouse/significant other Available Help at Discharge: Family;Available 24 hours/day Type of Home: House Home Access: Stairs to enter [employer] of Steps: 2 Entrance Stairs-Rails: None Home Layout: One level Home Equipment: Walker - 2 wheels;Cane - single point;Shower seat;Grab bars - toilet;Hand held shower head;Grab bars - tub/shower;Walker - 4 wheels (Sitz bath sits on commode to bathe) Prior Function Level of Independence: Independent Communication Communication: No difficulties Dominant Hand: Left    Cognition  Cognition Arousal/Alertness: Awake/alert Behavior During Therapy: WFL for tasks assessed/performed Overall Cognitive Status: Within Functional Limits for tasks assessed    Extremity/Trunk Assessment Upper Extremity Assessment Upper Extremity Assessment: Defer to OT evaluation Lower Extremity Assessment Lower Extremity Assessment: Generalized weakness Cervical / Trunk Assessment Cervical / Trunk Assessment: Normal   Balance Balance Balance Assessed: Yes Static Standing Balance Static Standing -  Balance Support: During functional activity;Left upper extremity supported Static Standing - Level of Assistance: 4: Min assist Static Standing - Comment/# of Minutes: 3  End of Session PT - End of Session Equipment Utilized During Treatment: Gait belt Activity Tolerance: Patient limited by fatigue Patient left: in chair;with call bell/phone within reach;with family/visitor present Nurse Communication: Mobility status       INGOLD,Cheron Coryell 06/04/2013, 3:30 PM [employer] [employer] Acute Rehabilitation [phone] (pager)
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
## 2013-06-05 LAB — URINE CULTURE: Colony Count: >=100000

## 2013-06-05 MED ORDER — RIVAROXABAN 15 MG PO TABS: 15.0000 mg | ORAL_TABLET | Freq: Every day | ORAL | Status: DC

## 2013-06-05 MED ORDER — VALSARTAN 160 MG PO TABS: 160.0000 mg | ORAL_TABLET | Freq: Every day | ORAL | Status: DC

## 2013-06-05 MED ORDER — CEFUROXIME AXETIL 500 MG PO TABS: 500.0000 mg | ORAL_TABLET | Freq: Two times a day (BID) | ORAL | Status: DC
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
## 2013-06-05 NOTE — Progress Notes (Signed)
Patient Name: Kristen Garrison Date of Encounter: 06/05/2013     Principal Problem:   Syncope Active Problems:   Atrial fibrillation   HTN (hypertension)   UTI (urinary tract infection)   Contusion of right elbow and forearm    SUBJECTIVE  The patient has not had any more dizziness or syncope since admission. Telemetry shows normal sinus rhythm.  No pauses.  CURRENT MEDS . amLODipine  5 mg Oral Daily  . brimonidine  1 drop Both Eyes TID  . cefTRIAXone (ROCEPHIN)  IV  1 g Intravenous Q24H  . cholestyramine  2 g Oral BID WC  . clorazepate  3.75 mg Oral QHS  . etodolac  400 mg Oral TID  . FLUoxetine  10 mg Oral Daily  . irbesartan  150 mg Oral Daily  . latanoprost  1 drop Both Eyes QHS  . pantoprazole  40 mg Oral BID  . predniSONE  2 mg Oral Daily  . Rivaroxaban  15 mg Oral Daily  . sodium chloride  3 mL Intravenous Q12H  . sotalol  80 mg Oral BID    OBJECTIVE  Filed Vitals:   06/04/13 0932 06/04/13 1423 06/04/13 2056 06/05/13 0442  BP: 136/49 116/59 116/78 119/43  Pulse: 79 59 59 55  Temp:  97.2 F (36.2 C) 98.1 F (36.7 C) 98.1 F (36.7 C)  TempSrc:  Oral Oral Oral  Resp:  16 18 17   Height:      Weight:    186 lb 8 oz (84.596 kg)  SpO2:  97% 95% 93%    Intake/Output Summary (Last 24 hours) at 06/05/13 0908 Last data filed at 06/05/13 0730  Gross per 24 hour  Intake    723 ml  Output      0 ml  Net    723 ml   Filed Weights   06/04/13 0358 06/04/13 0451 06/05/13 0442  Weight: 184 lb 8 oz (83.689 kg) 184 lb (83.462 kg) 186 lb 8 oz (84.596 kg)    PHYSICAL EXAM  General: Pleasant, NAD. Neuro: Alert and oriented X 3.  Psych: Normal affect. HEENT:  Normal.  Bruises on face.  Neck: Supple without bruits or JVD. Lungs:  Resp regular and unlabored, CTA. Heart: RRR no s3, s4, or murmurs. Abdomen: Soft, non-tender, non-distended, BS + x 4.  Extremities: No clubbing, cyanosis or edema. DP/PT/Radials 2+ and equal bilaterally.  Accessory Clinical  Findings  CBC  Recent Labs  06/03/13 2010 06/04/13 0412  WBC 13.6* 9.8  NEUTROABS 9.3*  --   HGB 12.4 11.3*  HCT 37.6 34.8*  MCV 94.2 92.8  PLT 273 257   Basic Metabolic Panel  Recent Labs  06/03/13 2010 06/04/13 0412  NA 139 137  K 4.7 4.1  CL 101 101  CO2 26 26  GLUCOSE 109* 100*  BUN 25* 20  CREATININE 1.05 0.88  CALCIUM 8.9 8.6   Liver Function Tests  Recent Labs  06/04/13 0412  AST 63*  ALT 21  ALKPHOS 76  BILITOT 0.5  PROT 6.2  ALBUMIN 3.1*   No results found for this basename: LIPASE, AMYLASE,  in the last 72 hours Cardiac Enzymes  Recent Labs  06/03/13 2010 06/04/13 1330 06/04/13 2025  TROPONINI <0.30 <0.30 <0.30   BNP No components found with this basename: POCBNP,  D-Dimer No results found for this basename: DDIMER,  in the last 72 hours Hemoglobin A1C No results found for this basename: HGBA1C,  in the last 72 hours Fasting Lipid  Panel No results found for this basename: CHOL, HDL, LDLCALC, TRIG, CHOLHDL, LDLDIRECT,  in the last 72 hours Thyroid Function Tests No results found for this basename: TSH, T4TOTAL, FREET3, T3FREE, THYROIDAB,  in the last 72 hours  TELE  NSR.  ECG  ECHO: Study Conclusions  - Left ventricle: The cavity size was normal. Systolic function was normal. The estimated ejection fraction was in the range of 60% to 65%. Wall motion was normal; there were no regional wall motion abnormalities. - Pericardium, extracardiac: A trivial pericardial effusion was identified posterior to the heart.   Radiology/Studies  Dg Shoulder Right  06/03/2013   CLINICAL DATA:  Right shoulder pain and bruising.  EXAM: RIGHT SHOULDER - 2+ VIEW  COMPARISON:  None.  FINDINGS: No evidence of fracture or dislocation. Spurring is seen along the undersurface of the acromion. Radiodensity also seen in the region of the rotator cuff tendon, suspicious calcific tendinitis.  IMPRESSION: No acute findings.  Degenerative spurring of  undersurface of acromion and probable calcific rotator cuff tendinitis.   Electronically Signed   By: Myles Rosenthal M.D.   On: 06/03/2013 19:37   Dg Elbow Complete Right  06/03/2013   CLINICAL DATA:  Elbow pain and bruising.  EXAM: RIGHT ELBOW - COMPLETE 3+ VIEW  COMPARISON:  None.  FINDINGS: Moderate soft tissue swelling seen overlying the olecranon process. Small elbow joint effusion seen. No evidence of acute fracture. On 2 oblique views, there is widening of the medial joint space, consistent with transient dislocation or subluxation of the ulna, which is subsequently reduced on the final film. This suggests ligamentous injury.  IMPRESSION: No evidence of fracture identified.  Transient subluxation or dislocation of the ulna, consistent with ligamentous injury.  Small elbow joint effusion.   Electronically Signed   By: Myles Rosenthal M.D.   On: 06/03/2013 19:43   Ct Head Wo Contrast  06/03/2013   CLINICAL DATA:  Syncope, fell on concrete sidewalk striking right side of head and face, right facial bruising and pain  EXAM: CT HEAD WITHOUT CONTRAST  CT CERVICAL SPINE WITHOUT CONTRAST  TECHNIQUE: Multidetector CT imaging of the head and cervical spine was performed following the standard protocol without intravenous contrast. Multiplanar CT image reconstructions of the cervical spine were also generated.  COMPARISON:  CT head 04/29/2012  FINDINGS: CT HEAD FINDINGS  Generalized atrophy.  Normal ventricular morphology.  No midline shift or mass effect.  Mild small vessel chronic ischemic changes of deep cerebral white matter.  No intracranial hemorrhage, mass lesion or evidence acute infarction.  No extra-axial fluid collections.  Soft tissue hematoma identified lateral to the right orbit extending into the right temporal region.  Bones and sinuses unremarkable.  CT CERVICAL SPINE FINDINGS  Diffuse osseous demineralization.  Disc space narrowing with endplate spur formation at C4-C5 through C6-C7.  Minimal  retrolisthesis at C5-C6 and C6-C7.  Minimal anterolisthesis C4-C5.  Multilevel facet degenerative changes.  Vertebral body heights maintained without fracture or additional subluxation.  Prevertebral soft tissues normal thickness.  Calcified granuloma right apex.  Visualized skullbase intact.  IMPRESSION: CT HEAD:  Atrophy with mild small vessel chronic ischemic changes of deep cerebral white matter.  No acute intracranial abnormalities.  CT CERVICAL SPINE:  Multilevel degenerative disc and facet disease changes.  No acute abnormalities.   Electronically Signed   By: Ulyses Southward M.D.   On: 06/03/2013 19:21   Ct Cervical Spine Wo Contrast  06/03/2013   CLINICAL DATA:  Syncope, fell on concrete sidewalk  striking right side of head and face, right facial bruising and pain  EXAM: CT HEAD WITHOUT CONTRAST  CT CERVICAL SPINE WITHOUT CONTRAST  TECHNIQUE: Multidetector CT imaging of the head and cervical spine was performed following the standard protocol without intravenous contrast. Multiplanar CT image reconstructions of the cervical spine were also generated.  COMPARISON:  CT head 04/29/2012  FINDINGS: CT HEAD FINDINGS  Generalized atrophy.  Normal ventricular morphology.  No midline shift or mass effect.  Mild small vessel chronic ischemic changes of deep cerebral white matter.  No intracranial hemorrhage, mass lesion or evidence acute infarction.  No extra-axial fluid collections.  Soft tissue hematoma identified lateral to the right orbit extending into the right temporal region.  Bones and sinuses unremarkable.  CT CERVICAL SPINE FINDINGS  Diffuse osseous demineralization.  Disc space narrowing with endplate spur formation at C4-C5 through C6-C7.  Minimal retrolisthesis at C5-C6 and C6-C7.  Minimal anterolisthesis C4-C5.  Multilevel facet degenerative changes.  Vertebral body heights maintained without fracture or additional subluxation.  Prevertebral soft tissues normal thickness.  Calcified granuloma right  apex.  Visualized skullbase intact.  IMPRESSION: CT HEAD:  Atrophy with mild small vessel chronic ischemic changes of deep cerebral white matter.  No acute intracranial abnormalities.  CT CERVICAL SPINE:  Multilevel degenerative disc and facet disease changes.  No acute abnormalities.   Electronically Signed   By: Ulyses Southward M.D.   On: 06/03/2013 19:21   Ct Elbow Right W/o Cm  06/04/2013   CLINICAL DATA:  Fall with elbow injury, bruising, and pain.  EXAM: CT OF THE RIGHT ELBOW WITHOUT CONTRAST  TECHNIQUE: Multidetector CT imaging was performed according to the standard protocol. Multiplanar CT image reconstructions were also generated.  COMPARISON:  None.  FINDINGS: 3 x 2 x 2 mm free osteochondral fragment posteriorly within the elbow joint, image 25 of series 16109  0.8 x 0.3 x 0.4 cm bony structure along the proximal- medial margin of the olecranon, image 19 of series 60454, seems well corticated but there is an adjacent cortical defect in the olecranon on image 58 of series 09811, and accordingly the appearance is suspicious for avulsion at the long head triceps attachment. Injury of the adjacent ulnar nerve is not readily excluded, and the medial cutaneous nerve of the forearm (posterior branch) is obscured by surrounding subcutaneous edema.  The triceps is not completely avulsed.  There is a small ossific structure proximally in the common extensor tendon which appears well corticated and likely chronic.  Extensive subcutaneous bruising posterior medially along the elbow and along the superficial margins of the flexor carpi ulnaris and brachialis muscles.  IMPRESSION: 1. Suspected small bony avulsion of the olecranon at the attachment of the long head of the triceps. Cannot exclude injury of the adjacent ulnar nerve -correlate with in the ulnar neuropathy. Extensive overlying bruising in the posterior medial elbow region. 2. Small free osteochondral fragment is loose within the elbow joint. This could be  acute or chronic -donor site not obvious. 3. Chronic appearing small ossifications in the common flexor tendon. 4. Extensive edema along the common extensor muscular components, possibly hematoma.   Electronically Signed   By: Herbie Baltimore M.D.   On: 06/04/2013 08:05   Dg Knee Complete 4 Views Right  06/03/2013   CLINICAL DATA:  Fall with pain and bruising.  EXAM: RIGHT KNEE - COMPLETE 4+ VIEW  COMPARISON:  None.  FINDINGS: Status post total knee arthroplasty. No acute hardware complication. Remote incompletely imaged femoral shaft  fracture. No joint effusion.  IMPRESSION: Right total knee arthroplasty, without acute osseous finding.   Electronically Signed   By: Jeronimo Greaves M.D.   On: 06/03/2013 22:00    ASSESSMENT AND PLAN 1. Syncope uncertain etiology. 2. Past history of atrial fibrillation holding NSR on sotolol. 3. Hypertension.  Plan: Okay for discharge today. Agree with sending her home on lower dose of diovan since orthostatic hypotension could have played a role. Follow up with Dr. Verdis Prime. Outpatient event monitor.  Signed, Cassell Clement MD

## 2013-06-06 NOTE — Progress Notes (Signed)
   CARE MANAGEMENT NOTE 06/06/2013  Patient:  Kristen Garrison, Kristen Garrison   Account Number:  [id number]  Date Initiated:  06/06/2013  Documentation initiated by:  [facility]  Subjective/Objective Assessment:   adm: Syncope     Action/Plan:   discharge planning   Anticipated DC Date:  06/05/2013   Anticipated DC Plan:  HOME W HOME HEALTH SERVICES      DC Planning Services  CM consult      [facility] Choice  HOME HEALTH   Choice offered to / List presented to:  C-3 Spouse        HH arranged  HH-1 RN  HH-2 PT  HH-4 NURSE'S AIDE      HH agency  [facility]   Status of service:  Completed, signed off Medicare Important Message given?   (If response is "NO", the following Medicare IM given date fields will be blank) Date Medicare IM given:   Date Additional Medicare IM given:    Discharge Disposition:  HOME W HOME HEALTH SERVICES  Per UR Regulation:    If discussed at Long Length of Stay Meetings, dates discussed:    Comments:  06/06/13 14:20 CM spoke with spouse of pt for choice and DME needs.  Spouse of pt states no equipment is needed and chooses [facility] for HHPT/RN/aide.  Address and contact numbers were verified and referral faxed to [facility].  No other CM needs were communicated.  Freddy Jaksch, BSN, CM [phone].

## 2013-06-07 NOTE — Care Management Note (Signed)
    Page 1 of 2   06/07/2013     3:59:08 PM   CARE MANAGEMENT NOTE 06/07/2013  Patient:  KETINA, MARS   Account Number:  [id number]  Date Initiated:  06/06/2013  Documentation initiated by:  [facility]  Subjective/Objective Assessment:   adm: Syncope     Action/Plan:   discharge planning   Anticipated DC Date:  06/05/2013   Anticipated DC Plan:  HOME W HOME HEALTH SERVICES      DC Planning Services  CM consult      [facility] Choice  HOME HEALTH   Choice offered to / List presented to:  C-3 Spouse        HH arranged  HH-1 RN  HH-2 PT  HH-4 NURSE'S AIDE      HH agency  [facility]   Status of service:  Completed, signed off Medicare Important Message given?   (If response is "NO", the following Medicare IM given date fields will be blank) Date Medicare IM given:   Date Additional Medicare IM given:    Discharge Disposition:  HOME W HOME HEALTH SERVICES  Per UR Regulation:    If discussed at Long Length of Stay Meetings, dates discussed:    Comments:  06/07/13 Emmily Pellegrin,RN,BSN [phone] RECEIVED MESSAGE FROM HUSBAND THAT NO CONTACT HAD BEEN MADE AS OF YET FROM HH AGENCY.  NOTIFIED [facility] OF THIS AND GAVE HUSBAND'S CELL # , [phone] [facility]).  AGENCY TO CONTACT PT.  06/06/13 14:20 CM spoke with spouse of pt for choice and DME needs.  Spouse of pt states no equipment is needed and chooses [facility] for HHPT/RN/aide.  Address and contact numbers were verified and referral faxed to [facility].  No other CM needs were communicated.  Freddy Jaksch, BSN, CM [phone].

## 2013-06-09 ENCOUNTER — Encounter: Payer: Self-pay | Admitting: Interventional Cardiology

## 2013-06-10 ENCOUNTER — Ambulatory Visit (INDEPENDENT_AMBULATORY_CARE_PROVIDER_SITE_OTHER): Payer: Medicare Other | Admitting: Interventional Cardiology

## 2013-06-10 ENCOUNTER — Encounter: Payer: Self-pay | Admitting: Interventional Cardiology

## 2013-06-10 VITALS — BP 144/90 | HR 80 | Ht 64.0 in | Wt 183.8 lb

## 2013-06-10 DIAGNOSIS — R55 Syncope and collapse: Secondary | ICD-10-CM

## 2013-06-10 DIAGNOSIS — I4891 Unspecified atrial fibrillation: Secondary | ICD-10-CM

## 2013-06-10 DIAGNOSIS — I495 Sick sinus syndrome: Secondary | ICD-10-CM

## 2013-06-10 DIAGNOSIS — I10 Essential (primary) hypertension: Secondary | ICD-10-CM

## 2013-06-10 NOTE — Patient Instructions (Signed)
Your physician recommends that you continue on your current medications as directed. Please refer to the Current Medication list given to you today.  Your physician has recommended that you wear an event monitor. Event monitors are medical devices that record the heart's electrical activity. Doctors most often [place] these monitors to diagnose arrhythmias. Arrhythmias are problems with the speed or rhythm of the heartbeat. The monitor is a small, portable device. You can wear one while you do your normal daily activities. This is usually used to diagnose what is causing palpitations/syncope (passing out).(To be scheduled asap)  Your physician recommends that you schedule a follow-up appointment in: 6-8 weeks
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
## 2013-06-11 ENCOUNTER — Other Ambulatory Visit: Payer: Self-pay | Admitting: Interventional Cardiology

## 2013-06-11 DIAGNOSIS — R55 Syncope and collapse: Secondary | ICD-10-CM

## 2013-06-16 ENCOUNTER — Encounter: Payer: Self-pay | Admitting: *Deleted

## 2013-06-16 ENCOUNTER — Encounter (INDEPENDENT_AMBULATORY_CARE_PROVIDER_SITE_OTHER): Payer: Medicare Other

## 2013-06-16 DIAGNOSIS — I495 Sick sinus syndrome: Secondary | ICD-10-CM

## 2013-06-16 DIAGNOSIS — R55 Syncope and collapse: Secondary | ICD-10-CM

## 2013-06-16 NOTE — Progress Notes (Signed)
Patient ID: Kristen Garrison, female   DOB: [birth year], 79 y.o.   MRN: [id number] [employer] 30 day cardiac event monitor applied to patient.

## 2013-06-18 ENCOUNTER — Telehealth: Payer: Self-pay | Admitting: Cardiology

## 2013-06-18 NOTE — Telephone Encounter (Signed)
Called by [employer] due to conversion to atrial fibrillation with variable rates of 60 to 120. Auto trigger, not due to symptoms. Known paroxysmal afib. Noted to be on sotalol and xarelto. No significant pauses. Still in afib at time of notification, rates ~100.  FYI to Dr. Katrinka Blazing.

## 2013-07-02 ENCOUNTER — Telehealth: Payer: Self-pay

## 2013-07-02 MED ORDER — AMLODIPINE BESYLATE 5 MG PO TABS: 5.0000 mg | ORAL_TABLET | Freq: Every day | ORAL | Status: DC

## 2013-07-02 NOTE — Telephone Encounter (Signed)
Refilled

## 2013-07-21 ENCOUNTER — Telehealth: Payer: Self-pay

## 2013-07-21 NOTE — Telephone Encounter (Signed)
pt given results of cardiac monitor.2 episodes of atrial fibrillation. afib with rvr.no excessive brady.no change in therapy.pt verbalized understanding.

## 2013-08-06 ENCOUNTER — Ambulatory Visit: Payer: Medicare Other | Admitting: Interventional Cardiology

## 2013-09-01 ENCOUNTER — Encounter: Payer: Self-pay | Admitting: Interventional Cardiology

## 2013-09-01 ENCOUNTER — Encounter (INDEPENDENT_AMBULATORY_CARE_PROVIDER_SITE_OTHER): Payer: Self-pay

## 2013-09-01 ENCOUNTER — Ambulatory Visit (INDEPENDENT_AMBULATORY_CARE_PROVIDER_SITE_OTHER): Payer: Medicare Other | Admitting: Interventional Cardiology

## 2013-09-01 VITALS — BP 110/40 | HR 55 | Ht 64.0 in | Wt 176.0 lb

## 2013-09-01 DIAGNOSIS — Z7901 Long term (current) use of anticoagulants: Secondary | ICD-10-CM | POA: Insufficient documentation

## 2013-09-01 DIAGNOSIS — I10 Essential (primary) hypertension: Secondary | ICD-10-CM

## 2013-09-01 DIAGNOSIS — R55 Syncope and collapse: Secondary | ICD-10-CM

## 2013-09-01 DIAGNOSIS — I4891 Unspecified atrial fibrillation: Secondary | ICD-10-CM

## 2013-09-01 NOTE — Patient Instructions (Signed)
Your physician recommends that you continue on your current medications as directed. Please refer to the Current Medication list given to you today.  Your physician recommends that you schedule a follow-up appointment with one of our EP Physicians  Your physician wants you to follow-up in: 6 months with Dr.Smith You will receive a reminder letter in the mail two months in advance. If you don't receive a letter, please call our office to schedule the follow-up appointment.
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
## 2013-09-08 ENCOUNTER — Other Ambulatory Visit: Payer: Self-pay

## 2013-09-08 MED ORDER — RIVAROXABAN 15 MG PO TABS: 15.0000 mg | ORAL_TABLET | Freq: Every day | ORAL | Status: DC

## 2013-09-30 ENCOUNTER — Ambulatory Visit (INDEPENDENT_AMBULATORY_CARE_PROVIDER_SITE_OTHER): Payer: Medicare Other | Admitting: Internal Medicine

## 2013-09-30 ENCOUNTER — Encounter: Payer: Self-pay | Admitting: Internal Medicine

## 2013-09-30 VITALS — BP 115/55 | HR 68 | Ht 64.0 in | Wt 177.0 lb

## 2013-09-30 DIAGNOSIS — I4891 Unspecified atrial fibrillation: Secondary | ICD-10-CM
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
## 2013-10-08 ENCOUNTER — Telehealth: Payer: Self-pay | Admitting: Internal Medicine

## 2013-10-08 NOTE — Telephone Encounter (Signed)
Nees Loop done. I spoke to Dr. Graciela HusbandsKlein

## 2013-10-08 NOTE — Telephone Encounter (Signed)
New Prob    Calling regarding setting up appt to have loop recorder placed. Please Call.

## 2013-10-08 NOTE — Telephone Encounter (Signed)
Spoke with pt who is asking about possibility of having loop recorder placed. She reports Dr.Smith and Dr.Klein were to discuss and get back to her.  Will forward to MD's for review

## 2013-10-08 NOTE — Telephone Encounter (Signed)
I spoke with pt and gave her this information. She would like to proceed with loop recorder. I told her Dr. Odessa FlemingKlein's nurse would contact her to schedule this appt

## 2013-10-14 NOTE — Telephone Encounter (Signed)
Left message on voice mail  to call back

## 2013-10-15 NOTE — Telephone Encounter (Signed)
Follow up     Waiting to hear from the nurse regarding getting a loop recorder.  Please call

## 2013-10-15 NOTE — Telephone Encounter (Signed)
Called wanting to schedule her procedure-loop recorder.  Advised that Dr. Odessa FlemingKlein's nurse is out of the office until the first of the week.  Will forward message to her to make her aware.

## 2013-10-19 ENCOUNTER — Other Ambulatory Visit: Payer: Self-pay | Admitting: *Deleted

## 2013-10-19 ENCOUNTER — Encounter: Payer: Self-pay | Admitting: *Deleted

## 2013-10-19 DIAGNOSIS — R55 Syncope and collapse: Secondary | ICD-10-CM

## 2013-10-19 DIAGNOSIS — I4891 Unspecified atrial fibrillation: Secondary | ICD-10-CM

## 2013-10-19 DIAGNOSIS — Z01812 Encounter for preprocedural laboratory examination: Secondary | ICD-10-CM

## 2013-10-19 NOTE — Telephone Encounter (Signed)
Scheduled LINQ implant for 4/29, pre procedure labs 4/22, wound check 5/13. Discussed procedure instructions and letter of instructions left at front desk for pt to pick up tomorrow. Patient verbalized understanding and agreeable to plan.

## 2013-10-19 NOTE — Telephone Encounter (Signed)
Follow Up:  Pt states she is still waiting for a call back regarding scheduling a procedure.

## 2013-10-20 ENCOUNTER — Other Ambulatory Visit (INDEPENDENT_AMBULATORY_CARE_PROVIDER_SITE_OTHER): Payer: Medicare Other

## 2013-10-20 DIAGNOSIS — Z01812 Encounter for preprocedural laboratory examination: Secondary | ICD-10-CM

## 2013-10-20 DIAGNOSIS — R55 Syncope and collapse: Secondary | ICD-10-CM

## 2013-10-20 DIAGNOSIS — I4891 Unspecified atrial fibrillation: Secondary | ICD-10-CM

## 2013-10-20 LAB — BASIC METABOLIC PANEL
BUN: 23 mg/dL (ref 6–23)
CALCIUM: 9.2 mg/dL (ref 8.4–10.5)
CO2: 28 mEq/L (ref 19–32)
Chloride: 101 mEq/L (ref 96–112)
Creatinine, Ser: 1.2 mg/dL (ref 0.4–1.2)
GFR: 44.97 mL/min — AB (ref >60.00)
GLUCOSE: 125 mg/dL — AB (ref 70–99)
Potassium: 5.1 mEq/L (ref 3.5–5.1)
SODIUM: 138 meq/L (ref 135–145)

## 2013-10-20 LAB — CBC WITH DIFFERENTIAL/PLATELET
BASOS ABS: 0 10*3/uL (ref 0.0–0.1)
Basophils Relative: 0.4 % (ref 0.0–3.0)
Eosinophils Absolute: 0.1 10*3/uL (ref 0.0–0.7)
Eosinophils Relative: 0.8 % (ref 0.0–5.0)
HCT: 36.9 % (ref 36.0–46.0)
Hemoglobin: 12.1 g/dL (ref 12.0–15.0)
Lymphocytes Relative: 31.1 % (ref 12.0–46.0)
Lymphs Abs: 2.7 10*3/uL (ref 0.7–4.0)
MCHC: 32.9 g/dL (ref 30.0–36.0)
MCV: 93.5 fl (ref 78.0–100.0)
Monocytes Absolute: 0.4 10*3/uL (ref 0.1–1.0)
Monocytes Relative: 4.4 % (ref 3.0–12.0)
NEUTROS ABS: 5.6 10*3/uL (ref 1.4–7.7)
Neutrophils Relative %: 63.3 % (ref 43.0–77.0)
Platelets: 326 10*3/uL (ref 150.0–400.0)
RBC: 3.95 Mil/uL (ref 3.87–5.11)
RDW: 13.9 % (ref 11.5–14.6)
WBC: 8.8 10*3/uL (ref 4.5–10.5)

## 2013-10-26 ENCOUNTER — Telehealth: Payer: Self-pay | Admitting: *Deleted

## 2013-10-26 NOTE — Telephone Encounter (Signed)
Rescheduled LINQ implant, pt to be at hospital at 9:30 am. Pt agreeable to plan.

## 2013-10-27 ENCOUNTER — Encounter (HOSPITAL_COMMUNITY)
Admission: RE | Disposition: A | Discharge status: Home / Self Care | Payer: Self-pay | Source: Ambulatory Visit | Attending: Internal Medicine

## 2013-10-27 ENCOUNTER — Encounter (HOSPITAL_COMMUNITY): Payer: Self-pay | Admitting: Pharmacy Technician

## 2013-10-27 ENCOUNTER — Encounter (HOSPITAL_COMMUNITY): Payer: Self-pay | Admitting: *Deleted

## 2013-10-27 ENCOUNTER — Ambulatory Visit (HOSPITAL_COMMUNITY)
Admission: RE | Admit: 2013-10-27 | Discharge: 2013-10-27 | Disposition: A | Discharge status: Home / Self Care | Payer: Medicare Other | Source: Ambulatory Visit | Attending: Internal Medicine | Admitting: Internal Medicine

## 2013-10-27 DIAGNOSIS — R55 Syncope and collapse: Secondary | ICD-10-CM | POA: Insufficient documentation

## 2013-10-27 DIAGNOSIS — K219 Gastro-esophageal reflux disease without esophagitis: Secondary | ICD-10-CM | POA: Insufficient documentation

## 2013-10-27 DIAGNOSIS — Z9181 History of falling: Secondary | ICD-10-CM | POA: Insufficient documentation

## 2013-10-27 DIAGNOSIS — Z96659 Presence of unspecified artificial knee joint: Secondary | ICD-10-CM | POA: Insufficient documentation

## 2013-10-27 DIAGNOSIS — M129 Arthropathy, unspecified: Secondary | ICD-10-CM | POA: Insufficient documentation

## 2013-10-27 DIAGNOSIS — E785 Hyperlipidemia, unspecified: Secondary | ICD-10-CM | POA: Insufficient documentation

## 2013-10-27 DIAGNOSIS — I440 Atrioventricular block, first degree: Secondary | ICD-10-CM | POA: Insufficient documentation

## 2013-10-27 DIAGNOSIS — Z7901 Long term (current) use of anticoagulants: Secondary | ICD-10-CM | POA: Insufficient documentation

## 2013-10-27 DIAGNOSIS — Z79899 Other long term (current) drug therapy: Secondary | ICD-10-CM | POA: Insufficient documentation

## 2013-10-27 DIAGNOSIS — R269 Unspecified abnormalities of gait and mobility: Secondary | ICD-10-CM | POA: Insufficient documentation

## 2013-10-27 DIAGNOSIS — I4891 Unspecified atrial fibrillation: Secondary | ICD-10-CM | POA: Insufficient documentation

## 2013-10-27 DIAGNOSIS — R42 Dizziness and giddiness: Secondary | ICD-10-CM | POA: Insufficient documentation

## 2013-10-27 DIAGNOSIS — I10 Essential (primary) hypertension: Secondary | ICD-10-CM | POA: Insufficient documentation

## 2013-10-27 HISTORY — PX: LOOP RECORDER IMPLANT: SHX5477

## 2013-10-27 SURGERY — LOOP RECORDER IMPLANT
Anesthesia: LOCAL

## 2013-10-27 MED ORDER — LIDOCAINE-EPINEPHRINE 1 %-1:100000 IJ SOLN
INTRAMUSCULAR | Status: AC
  Filled 2013-10-27: qty 1
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
## 2013-10-27 NOTE — CV Procedure (Signed)
Pre op Dx  Syncope atrial fib Post op Dx same  Procedure  Loop Recorder implantation  After routine prep and drape of the left parasternal area, a small incision was created. A [employer] LINQ Reveal Loop Recorder  Serial Number  [id number] was inserted.    steristrip dressing was  applied.  The patient tolerated the procedure without apparent complication.

## 2013-10-27 NOTE — Interval H&P Note (Signed)
History and Physical Interval Note:  10/27/2013 11:21 AM  Kristen Garrison  has presented today for surgery, with the diagnosis of Syncope and A.Fib  The various methods of treatment have been discussed with the patient and family. After consideration of risks, benefits and other options for treatment, the patient has consented to  Procedure(s): LOOP RECORDER IMPLANT (N/A) as a surgical intervention .  The patient's history has been reviewed, patient examined, no change in status, stable for surgery.  I have reviewed the patient's chart and labs.  Questions were answered to the patient's satisfaction.     Duke SalviaSteven C Klein

## 2013-10-27 NOTE — Discharge Instructions (Signed)
Supplemental discharge instructions given

## 2013-10-27 NOTE — Interval H&P Note (Signed)
History and Physical Interval Note:  10/27/2013 8:20 AM  Kristen Garrison  has presented today for surgery, with the diagnosis of Syncope and A.Fib  The various methods of treatment have been discussed with the patient and family. After consideration of risks, benefits and other options for treatment, the patient has consented to  Procedure(s): LOOP RECORDER IMPLANT (N/A) as a surgical intervention .  The patient's history has been reviewed, patient examined, no change in status, stable for surgery.  I have reviewed the patient's chart and labs.  Questions were answered to the patient's satisfaction.     Duke SalviaSteven C Klein  Having spoken with Dr Katrinka BlazingSmith he would like to pursue loop recorder insertion for atrial fibrillation management;  It may also be helpful in regards to her syncope

## 2013-10-28 ENCOUNTER — Ambulatory Visit (INDEPENDENT_AMBULATORY_CARE_PROVIDER_SITE_OTHER): Payer: Medicare Other | Admitting: *Deleted

## 2013-10-28 ENCOUNTER — Telehealth: Payer: Self-pay | Admitting: Internal Medicine

## 2013-10-28 DIAGNOSIS — R55 Syncope and collapse: Secondary | ICD-10-CM

## 2013-10-28 NOTE — Telephone Encounter (Signed)
Spoke w/pt and pt to run by office this afternoon just for quick site check.

## 2013-10-28 NOTE — Telephone Encounter (Signed)
New message     Patient calling c/o site is bloody looking

## 2013-10-28 NOTE — Progress Notes (Signed)
The patient was seen for concerns of oozing from the site of her newly implanted ILR.  Steri strips were change and no further bleeding noted.  Follow up 5/13.

## 2013-11-10 ENCOUNTER — Ambulatory Visit (INDEPENDENT_AMBULATORY_CARE_PROVIDER_SITE_OTHER): Payer: Medicare Other | Admitting: *Deleted

## 2013-11-10 DIAGNOSIS — R55 Syncope and collapse: Secondary | ICD-10-CM

## 2013-11-10 LAB — MDC_IDC_ENUM_SESS_TYPE_INCLINIC

## 2013-11-10 MED ORDER — CLINDAMYCIN HCL 300 MG PO CAPS: 300.0000 mg | ORAL_CAPSULE | Freq: Three times a day (TID) | ORAL | Status: AC

## 2013-11-10 NOTE — Progress Notes (Signed)
ILR wound check.  Site is red, edematous with old yellow drainage noted on the bandaid.  The patient was instructed to wash the area with warm soap and water twice daily and was started on Clindamycin 300mg  tid for 5 days per Dr. Ladona Ridgelaylor.  ROV 11/17/13 with the device clinic when Dr. Johney FrameAllred is in clinic also.  No episodes noted.

## 2013-11-17 ENCOUNTER — Ambulatory Visit: Payer: Medicare Other

## 2013-11-19 ENCOUNTER — Ambulatory Visit (INDEPENDENT_AMBULATORY_CARE_PROVIDER_SITE_OTHER): Payer: Medicare Other | Admitting: *Deleted

## 2013-11-19 DIAGNOSIS — R55 Syncope and collapse: Secondary | ICD-10-CM

## 2013-11-19 LAB — MDC_IDC_ENUM_SESS_TYPE_INCLINIC

## 2013-11-19 NOTE — Progress Notes (Signed)
ILR wound re-check only, device not interrogated. Site no longer red. Drainage no longer present. Pt did have diarrhea due to Clindamycin--added to allergy list. ROV w/ Dr. Graciela Husbands 02/22/14.

## 2013-11-26 ENCOUNTER — Ambulatory Visit: Payer: Medicare Other

## 2013-11-26 ENCOUNTER — Encounter: Payer: Medicare Other | Admitting: *Deleted

## 2013-11-26 DIAGNOSIS — R55 Syncope and collapse: Secondary | ICD-10-CM

## 2013-12-08 ENCOUNTER — Encounter: Payer: Self-pay | Admitting: Internal Medicine

## 2013-12-09 ENCOUNTER — Encounter: Payer: Self-pay | Admitting: Internal Medicine

## 2013-12-28 ENCOUNTER — Ambulatory Visit (INDEPENDENT_AMBULATORY_CARE_PROVIDER_SITE_OTHER): Payer: Medicare Other | Admitting: *Deleted

## 2013-12-28 DIAGNOSIS — R55 Syncope and collapse: Secondary | ICD-10-CM

## 2013-12-28 LAB — MDC_IDC_ENUM_SESS_TYPE_REMOTE

## 2013-12-30 ENCOUNTER — Other Ambulatory Visit: Payer: Self-pay

## 2013-12-30 MED ORDER — SOTALOL HCL 80 MG PO TABS: 80.0000 mg | ORAL_TABLET | Freq: Two times a day (BID) | ORAL | Status: DC

## 2014-01-04 NOTE — Progress Notes (Signed)
Loop recorder 

## 2014-01-12 ENCOUNTER — Ambulatory Visit: Payer: Medicare Other | Attending: Internal Medicine | Admitting: Physical Therapy

## 2014-01-12 DIAGNOSIS — IMO0001 Reserved for inherently not codable concepts without codable children: Secondary | ICD-10-CM | POA: Insufficient documentation

## 2014-01-12 DIAGNOSIS — I10 Essential (primary) hypertension: Secondary | ICD-10-CM | POA: Diagnosis not present

## 2014-01-12 DIAGNOSIS — R55 Syncope and collapse: Secondary | ICD-10-CM | POA: Insufficient documentation

## 2014-01-12 DIAGNOSIS — R269 Unspecified abnormalities of gait and mobility: Secondary | ICD-10-CM | POA: Insufficient documentation

## 2014-01-12 DIAGNOSIS — I4891 Unspecified atrial fibrillation: Secondary | ICD-10-CM | POA: Diagnosis not present

## 2014-01-12 DIAGNOSIS — M6281 Muscle weakness (generalized): Secondary | ICD-10-CM | POA: Insufficient documentation

## 2014-01-12 DIAGNOSIS — Z96659 Presence of unspecified artificial knee joint: Secondary | ICD-10-CM | POA: Insufficient documentation

## 2014-01-12 DIAGNOSIS — Z96649 Presence of unspecified artificial hip joint: Secondary | ICD-10-CM | POA: Diagnosis not present

## 2014-01-12 DIAGNOSIS — M1990 Unspecified osteoarthritis, unspecified site: Secondary | ICD-10-CM | POA: Diagnosis not present

## 2014-01-12 DIAGNOSIS — K5730 Diverticulosis of large intestine without perforation or abscess without bleeding: Secondary | ICD-10-CM | POA: Diagnosis not present

## 2014-01-18 ENCOUNTER — Ambulatory Visit: Payer: Medicare Other | Admitting: Physical Therapy

## 2014-01-18 DIAGNOSIS — IMO0001 Reserved for inherently not codable concepts without codable children: Secondary | ICD-10-CM | POA: Diagnosis not present

## 2014-01-19 ENCOUNTER — Ambulatory Visit: Payer: Medicare Other | Admitting: Physical Therapy

## 2014-01-21 ENCOUNTER — Ambulatory Visit: Payer: Medicare Other | Admitting: Physical Therapy

## 2014-01-25 ENCOUNTER — Ambulatory Visit: Payer: Medicare Other | Admitting: Physical Therapy

## 2014-01-25 DIAGNOSIS — IMO0001 Reserved for inherently not codable concepts without codable children: Secondary | ICD-10-CM | POA: Diagnosis not present

## 2014-01-27 ENCOUNTER — Ambulatory Visit: Payer: Medicare Other | Admitting: Physical Therapy

## 2014-01-27 ENCOUNTER — Ambulatory Visit (INDEPENDENT_AMBULATORY_CARE_PROVIDER_SITE_OTHER): Payer: Medicare Other | Admitting: *Deleted

## 2014-01-27 DIAGNOSIS — R55 Syncope and collapse: Secondary | ICD-10-CM

## 2014-01-28 ENCOUNTER — Ambulatory Visit: Payer: Medicare Other | Admitting: Physical Therapy

## 2014-01-28 DIAGNOSIS — IMO0001 Reserved for inherently not codable concepts without codable children: Secondary | ICD-10-CM | POA: Diagnosis not present

## 2014-01-31 ENCOUNTER — Ambulatory Visit: Payer: Medicare Other | Admitting: Physical Therapy

## 2014-02-02 ENCOUNTER — Ambulatory Visit: Payer: Medicare Other | Attending: Internal Medicine | Admitting: Physical Therapy

## 2014-02-02 DIAGNOSIS — R55 Syncope and collapse: Secondary | ICD-10-CM | POA: Diagnosis not present

## 2014-02-02 DIAGNOSIS — M1990 Unspecified osteoarthritis, unspecified site: Secondary | ICD-10-CM | POA: Insufficient documentation

## 2014-02-02 DIAGNOSIS — I4891 Unspecified atrial fibrillation: Secondary | ICD-10-CM | POA: Insufficient documentation

## 2014-02-02 DIAGNOSIS — IMO0001 Reserved for inherently not codable concepts without codable children: Secondary | ICD-10-CM | POA: Insufficient documentation

## 2014-02-02 DIAGNOSIS — K5730 Diverticulosis of large intestine without perforation or abscess without bleeding: Secondary | ICD-10-CM | POA: Diagnosis not present

## 2014-02-02 DIAGNOSIS — Z96649 Presence of unspecified artificial hip joint: Secondary | ICD-10-CM | POA: Diagnosis not present

## 2014-02-02 DIAGNOSIS — R269 Unspecified abnormalities of gait and mobility: Secondary | ICD-10-CM | POA: Diagnosis not present

## 2014-02-02 DIAGNOSIS — M6281 Muscle weakness (generalized): Secondary | ICD-10-CM | POA: Diagnosis not present

## 2014-02-02 DIAGNOSIS — I10 Essential (primary) hypertension: Secondary | ICD-10-CM | POA: Diagnosis not present

## 2014-02-02 DIAGNOSIS — Z96659 Presence of unspecified artificial knee joint: Secondary | ICD-10-CM | POA: Diagnosis not present

## 2014-02-02 NOTE — Progress Notes (Signed)
Loop recorder 

## 2014-02-04 ENCOUNTER — Encounter: Payer: Self-pay | Admitting: Internal Medicine

## 2014-02-04 LAB — MDC_IDC_ENUM_SESS_TYPE_REMOTE

## 2014-02-07 ENCOUNTER — Ambulatory Visit: Payer: Medicare Other | Admitting: Physical Therapy

## 2014-02-07 DIAGNOSIS — IMO0001 Reserved for inherently not codable concepts without codable children: Secondary | ICD-10-CM | POA: Diagnosis not present

## 2014-02-09 ENCOUNTER — Ambulatory Visit: Payer: Medicare Other | Admitting: Physical Therapy

## 2014-02-09 ENCOUNTER — Ambulatory Visit
Admission: RE | Admit: 2014-02-09 | Discharge: 2014-02-09 | Disposition: A | Discharge status: Home / Self Care | Payer: Medicare Other | Source: Ambulatory Visit | Attending: Internal Medicine | Admitting: Internal Medicine

## 2014-02-09 ENCOUNTER — Other Ambulatory Visit: Payer: Self-pay | Admitting: Internal Medicine

## 2014-02-09 DIAGNOSIS — M25551 Pain in right hip: Secondary | ICD-10-CM

## 2014-02-09 DIAGNOSIS — IMO0001 Reserved for inherently not codable concepts without codable children: Secondary | ICD-10-CM | POA: Diagnosis not present

## 2014-02-14 ENCOUNTER — Ambulatory Visit: Payer: Medicare Other | Admitting: Physical Therapy

## 2014-02-14 ENCOUNTER — Encounter: Payer: Self-pay | Admitting: Gastroenterology

## 2014-02-14 DIAGNOSIS — IMO0001 Reserved for inherently not codable concepts without codable children: Secondary | ICD-10-CM | POA: Diagnosis not present

## 2014-02-16 ENCOUNTER — Ambulatory Visit: Payer: Medicare Other | Admitting: Physical Therapy

## 2014-02-16 DIAGNOSIS — IMO0001 Reserved for inherently not codable concepts without codable children: Secondary | ICD-10-CM | POA: Diagnosis not present

## 2014-02-20 ENCOUNTER — Encounter (HOSPITAL_BASED_OUTPATIENT_CLINIC_OR_DEPARTMENT_OTHER): Payer: Self-pay | Admitting: Emergency Medicine

## 2014-02-20 ENCOUNTER — Emergency Department (HOSPITAL_BASED_OUTPATIENT_CLINIC_OR_DEPARTMENT_OTHER)
Admission: EM | Admit: 2014-02-20 | Discharge: 2014-02-20 | Disposition: A | Discharge status: Home / Self Care | Payer: Medicare Other | Attending: Emergency Medicine | Admitting: Emergency Medicine

## 2014-02-20 DIAGNOSIS — E789 Disorder of lipoprotein metabolism, unspecified: Secondary | ICD-10-CM | POA: Insufficient documentation

## 2014-02-20 DIAGNOSIS — W1809XA Striking against other object with subsequent fall, initial encounter: Secondary | ICD-10-CM | POA: Diagnosis not present

## 2014-02-20 DIAGNOSIS — Y929 Unspecified place or not applicable: Secondary | ICD-10-CM | POA: Diagnosis not present

## 2014-02-20 DIAGNOSIS — Y939 Activity, unspecified: Secondary | ICD-10-CM | POA: Insufficient documentation

## 2014-02-20 DIAGNOSIS — I4891 Unspecified atrial fibrillation: Secondary | ICD-10-CM | POA: Diagnosis not present

## 2014-02-20 DIAGNOSIS — K219 Gastro-esophageal reflux disease without esophagitis: Secondary | ICD-10-CM | POA: Diagnosis not present

## 2014-02-20 DIAGNOSIS — I10 Essential (primary) hypertension: Secondary | ICD-10-CM | POA: Diagnosis not present

## 2014-02-20 DIAGNOSIS — S51011A Laceration without foreign body of right elbow, initial encounter: Secondary | ICD-10-CM

## 2014-02-20 DIAGNOSIS — Z79899 Other long term (current) drug therapy: Secondary | ICD-10-CM | POA: Insufficient documentation

## 2014-02-20 DIAGNOSIS — M129 Arthropathy, unspecified: Secondary | ICD-10-CM | POA: Diagnosis not present

## 2014-02-20 DIAGNOSIS — W19XXXA Unspecified fall, initial encounter: Secondary | ICD-10-CM

## 2014-02-20 DIAGNOSIS — S51009A Unspecified open wound of unspecified elbow, initial encounter: Secondary | ICD-10-CM | POA: Insufficient documentation

## 2014-02-20 MED ORDER — HYDROCODONE-ACETAMINOPHEN 5-325 MG PO TABS: 0.5000 | ORAL_TABLET | ORAL | Status: DC | PRN

## 2014-02-20 NOTE — Discharge Instructions (Signed)
Fall Prevention and Home Safety Falls cause injuries and can affect all age groups. It is possible to prevent falls.  HOW TO PREVENT FALLS  Wear shoes with rubber soles that do not have an opening for your toes.  Keep the inside and outside of your house well lit.  Use night lights throughout your home.  Remove clutter from floors.  Clean up floor spills.  Remove throw rugs or fasten them to the floor with carpet tape.  Do not place electrical cords across pathways.  Put grab bars by your tub, shower, and toilet. Do not use towel bars as grab bars.  Put handrails on both sides of the stairway. Fix loose handrails.  Do not climb on stools or stepladders, if possible.  Do not wax your floors.  Repair uneven or unsafe sidewalks, walkways, or stairs.  Keep items you use a lot within reach.  Be aware of pets.  Keep emergency numbers next to the telephone.  Put smoke detectors in your home and near bedrooms. Ask your doctor what other things you can do to prevent falls. Document Released: 04/13/2009 Document Revised: 12/17/2011 Document Reviewed: 09/17/2011 [facility] Patient Information 2015 [employer], [place]. This information is not intended to replace advice given to you by your health care provider. Make sure you discuss any questions you have with your health care provider. Sutured Wound Care Sutures are stitches that can be used to close wounds. Wound care helps prevent pain and infection.  HOME CARE INSTRUCTIONS   Rest and elevate the injured area until all the pain and swelling are gone.  Only take over-the-counter or prescription medicines for pain, discomfort, or fever as directed by your caregiver.  After 48 hours, gently wash the area with mild soap and water once a day, or as directed. Rinse off the soap. Pat the area dry with a clean towel. Do not rub the wound. This may cause bleeding.  Follow your caregiver's instructions for how often to change the bandage  (dressing). Stop using a dressing after 2 days or after the wound stops draining.  If the dressing sticks, moisten it with soapy water and gently remove it.  Apply ointment on the wound as directed.  Avoid stretching a sutured wound.  Drink enough fluids to keep your urine clear or pale yellow.  Follow up with your caregiver for suture removal as directed.  Use sunscreen on your wound for the next 3 to 6 months so the scar will not darken. SEEK IMMEDIATE MEDICAL CARE IF:   Your wound becomes red, swollen, hot, or tender.  You have increasing pain in the wound.  You have a red streak that extends from the wound.  There is pus coming from the wound.  You have a fever.  You have shaking chills.  There is a bad smell coming from the wound.  You have persistent bleeding from the wound. MAKE SURE YOU:   Understand these instructions.  Will watch your condition.  Will get help right away if you are not doing well or get worse. Document Released: 07/25/2004 Document Revised: 09/09/2011 Document Reviewed: 10/21/2010 [facility] Patient Information 2015 [place] [employer], [place]. This information is not intended to replace advice given to you by your health care provider. Make sure you discuss any questions you have with your health care provider. Cryotherapy Cryotherapy means treatment with cold. Ice or gel packs can be used to reduce both pain and swelling. Ice is the most helpful within the first 24 to 48 hours after  an injury or flare-up from overusing a muscle or joint. Sprains, strains, spasms, burning pain, shooting pain, and aches can all be eased with ice. Ice can also be used when recovering from surgery. Ice is effective, has very few side effects, and is safe for most people to use. PRECAUTIONS  Ice is not a safe treatment option for people with:  Raynaud phenomenon. This is a condition affecting small blood vessels in the extremities. Exposure to cold may cause your problems to  return.  Cold hypersensitivity. There are many forms of cold hypersensitivity, including:  Cold urticaria. Red, itchy hives appear on the skin when the tissues begin to warm after being iced.  Cold erythema. This is a red, itchy rash caused by exposure to cold.  Cold hemoglobinuria. Red blood cells break down when the tissues begin to warm after being iced. The hemoglobin that carry oxygen are passed into the urine because they cannot combine with blood proteins fast enough.  Numbness or altered sensitivity in the area being iced. If you have any of the following conditions, do not use ice until you have discussed cryotherapy with your caregiver:  Heart conditions, such as arrhythmia, angina, or chronic heart disease.  High blood pressure.  Healing wounds or open skin in the area being iced.  Current infections.  Rheumatoid arthritis.  Poor circulation.  Diabetes. Ice slows the blood flow in the region it is applied. This is beneficial when trying to stop inflamed tissues from spreading irritating chemicals to surrounding tissues. However, if you expose your skin to cold temperatures for too long or without the proper protection, you can damage your skin or nerves. Watch for signs of skin damage due to cold. HOME CARE INSTRUCTIONS Follow these tips to use ice and cold packs safely.  Place a dry or damp towel between the ice and skin. A damp towel will cool the skin more quickly, so you may need to shorten the time that the ice is used.  For a more rapid response, add gentle compression to the ice.  Ice for no more than 10 to 20 minutes at a time. The bonier the area you are icing, the less time it will take to get the benefits of ice.  Check your skin after 5 minutes to make sure there are no signs of a poor response to cold or skin damage.  Rest 20 minutes or more between uses.  Once your skin is numb, you can end your treatment. You can test numbness by very lightly touching  your skin. The touch should be so light that you do not see the skin dimple from the pressure of your fingertip. When using ice, most people will feel these normal sensations in this order: cold, burning, aching, and numbness.  Do not use ice on someone who cannot communicate their responses to pain, such as small children or people with dementia. HOW TO MAKE AN ICE PACK Ice packs are the most common way to use ice therapy. Other methods include ice massage, ice baths, and cryosprays. Muscle creams that cause a cold, tingly feeling do not offer the same benefits that ice offers and should not be used as a substitute unless recommended by your caregiver. To make an ice pack, do one of the following:  Place crushed ice or a bag of frozen vegetables in a sealable plastic bag. Squeeze out the excess air. Place this bag inside another plastic bag. Slide the bag into a pillowcase or place a damp  towel between your skin and the bag.  Mix 3 parts water with 1 part rubbing alcohol. Freeze the mixture in a sealable plastic bag. When you remove the mixture from the freezer, it will be slushy. Squeeze out the excess air. Place this bag inside another plastic bag. Slide the bag into a pillowcase or place a damp towel between your skin and the bag. SEEK MEDICAL CARE IF:  You develop white spots on your skin. This may give the skin a blotchy (mottled) appearance.  Your skin turns blue or pale.  Your skin becomes waxy or hard.  Your swelling gets worse. MAKE SURE YOU:   Understand these instructions.  Will watch your condition.  Will get help right away if you are not doing well or get worse. Document Released: 02/11/2011 Document Revised: 11/01/2013 Document Reviewed: 02/11/2011 [facility] Patient Information 2015 [place], [place]. This information is not intended to replace advice given to you by your health care provider. Make sure you discuss any questions you have with your health care provider.

## 2014-02-20 NOTE — ED Notes (Signed)
Wound care is completed, patient ready for discharge.

## 2014-02-20 NOTE — ED Notes (Signed)
Fall at home with laceration to right elbow, dressing in place
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
## 2014-02-22 ENCOUNTER — Ambulatory Visit (INDEPENDENT_AMBULATORY_CARE_PROVIDER_SITE_OTHER): Payer: Medicare Other | Admitting: Internal Medicine

## 2014-02-22 ENCOUNTER — Encounter: Payer: Self-pay | Admitting: Internal Medicine

## 2014-02-22 VITALS — BP 110/52 | HR 67 | Ht 64.0 in | Wt 173.6 lb

## 2014-02-22 DIAGNOSIS — Z959 Presence of cardiac and vascular implant and graft, unspecified: Secondary | ICD-10-CM

## 2014-02-22 DIAGNOSIS — I4891 Unspecified atrial fibrillation: Secondary | ICD-10-CM

## 2014-02-22 DIAGNOSIS — R55 Syncope and collapse: Secondary | ICD-10-CM

## 2014-02-22 HISTORY — DX: Presence of cardiac and vascular implant and graft, unspecified: Z95.9

## 2014-02-22 LAB — MDC_IDC_ENUM_SESS_TYPE_INCLINIC

## 2014-02-22 NOTE — ED Provider Notes (Signed)
Medical screening examination/treatment/procedure(s) were conducted as a shared visit with non-physician practitioner(s) and myself.  I personally evaluated the patient during the encounter.   EKG Interpretation None      Pt seen and evaluated.  Fall with elbow laceration. Exam shows well approximated laceration.. Full rom.  Agree with evaluation and plan.  Rolland Porter, MD 02/22/14 [phone]
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
## 2014-02-22 NOTE — Patient Instructions (Addendum)
Your physician recommends that you continue on your current medications as directed. Please refer to the Current Medication list given to you today.  We will continue to follow your Linq device with your Carelink monitor.  Your physician recommends that you schedule a follow-up appointment in: 9 months with Dr.Klein

## 2014-02-23 ENCOUNTER — Ambulatory Visit: Payer: Medicare Other | Admitting: Physical Therapy

## 2014-02-23 DIAGNOSIS — IMO0001 Reserved for inherently not codable concepts without codable children: Secondary | ICD-10-CM | POA: Diagnosis not present

## 2014-02-24 ENCOUNTER — Encounter: Payer: Self-pay | Admitting: Internal Medicine

## 2014-02-25 ENCOUNTER — Ambulatory Visit (INDEPENDENT_AMBULATORY_CARE_PROVIDER_SITE_OTHER): Payer: Medicare Other | Admitting: *Deleted

## 2014-02-25 ENCOUNTER — Encounter: Payer: Self-pay | Admitting: Internal Medicine

## 2014-02-25 ENCOUNTER — Ambulatory Visit: Payer: Medicare Other | Admitting: Physical Therapy

## 2014-02-25 DIAGNOSIS — IMO0001 Reserved for inherently not codable concepts without codable children: Secondary | ICD-10-CM | POA: Diagnosis not present

## 2014-02-25 DIAGNOSIS — R55 Syncope and collapse: Secondary | ICD-10-CM

## 2014-02-25 LAB — MDC_IDC_ENUM_SESS_TYPE_REMOTE: MDC IDC SESS DTM: 20150825040500

## 2014-02-28 ENCOUNTER — Ambulatory Visit: Payer: Medicare Other | Admitting: Physical Therapy

## 2014-03-02 ENCOUNTER — Ambulatory Visit: Payer: Medicare Other | Attending: Internal Medicine | Admitting: Rehabilitative and Restorative Service Providers"

## 2014-03-02 DIAGNOSIS — K5730 Diverticulosis of large intestine without perforation or abscess without bleeding: Secondary | ICD-10-CM | POA: Insufficient documentation

## 2014-03-02 DIAGNOSIS — R55 Syncope and collapse: Secondary | ICD-10-CM | POA: Insufficient documentation

## 2014-03-02 DIAGNOSIS — R269 Unspecified abnormalities of gait and mobility: Secondary | ICD-10-CM | POA: Diagnosis not present

## 2014-03-02 DIAGNOSIS — Z96659 Presence of unspecified artificial knee joint: Secondary | ICD-10-CM | POA: Insufficient documentation

## 2014-03-02 DIAGNOSIS — I4891 Unspecified atrial fibrillation: Secondary | ICD-10-CM | POA: Diagnosis not present

## 2014-03-02 DIAGNOSIS — Z96649 Presence of unspecified artificial hip joint: Secondary | ICD-10-CM | POA: Diagnosis not present

## 2014-03-02 DIAGNOSIS — IMO0001 Reserved for inherently not codable concepts without codable children: Secondary | ICD-10-CM | POA: Diagnosis present

## 2014-03-02 DIAGNOSIS — M1990 Unspecified osteoarthritis, unspecified site: Secondary | ICD-10-CM | POA: Diagnosis not present

## 2014-03-02 DIAGNOSIS — I10 Essential (primary) hypertension: Secondary | ICD-10-CM | POA: Diagnosis not present

## 2014-03-02 DIAGNOSIS — M6281 Muscle weakness (generalized): Secondary | ICD-10-CM | POA: Insufficient documentation

## 2014-03-03 ENCOUNTER — Telehealth: Payer: Self-pay | Admitting: Interventional Cardiology

## 2014-03-03 NOTE — Telephone Encounter (Signed)
°  Patient would like talk to you regarding medication, please call and advise.

## 2014-03-03 NOTE — Progress Notes (Signed)
Loop recorder 

## 2014-03-03 NOTE — Telephone Encounter (Signed)
returned pt huisband call.he sts that he was provided assistance forms to help pay for pt Xarelto since pt is in the medicare donut hole.he does not want to provide their personal financial information. they cannot afford the out of pocket cost of Xarelto which is $400 a month. Pt has 3-4 tablets left. Adv pt husband that coumadin would be the most affordable anticoag, he sts that pt was previously on coumadin and cannot take it. Adv pt husband that the other agent are name brand and might still be a bit costly. Adv him I will fwd Dr.Smith a message to see what other agent he would recommend for pt, that way the can call their ins co to ck cost.adv pt husband that we do not want pt missing doses of Xarelto.I will ck to see if we have samples available,and also fwd message to Dr.Smith. Pt husband thanked and verbalized understanding.   Pt husband aware samples of Xarelto  left at front desk for pick up. He thanked me and verbalized understanding

## 2014-03-07 LAB — MDC_IDC_ENUM_SESS_TYPE_REMOTE: Date Time Interrogation Session: 20150524040500

## 2014-03-08 ENCOUNTER — Encounter: Payer: Self-pay | Admitting: Internal Medicine

## 2014-03-09 ENCOUNTER — Ambulatory Visit: Payer: Medicare Other | Admitting: Interventional Cardiology

## 2014-03-09 ENCOUNTER — Ambulatory Visit: Payer: Medicare Other | Admitting: Physical Therapy

## 2014-03-09 DIAGNOSIS — IMO0001 Reserved for inherently not codable concepts without codable children: Secondary | ICD-10-CM | POA: Diagnosis not present

## 2014-03-10 ENCOUNTER — Ambulatory Visit (INDEPENDENT_AMBULATORY_CARE_PROVIDER_SITE_OTHER): Payer: Medicare Other | Admitting: Interventional Cardiology

## 2014-03-10 ENCOUNTER — Encounter: Payer: Self-pay | Admitting: Interventional Cardiology

## 2014-03-10 VITALS — BP 110/54 | HR 66 | Ht 60.0 in | Wt 173.0 lb

## 2014-03-10 DIAGNOSIS — I4891 Unspecified atrial fibrillation: Secondary | ICD-10-CM

## 2014-03-10 DIAGNOSIS — I495 Sick sinus syndrome: Secondary | ICD-10-CM

## 2014-03-10 DIAGNOSIS — R55 Syncope and collapse: Secondary | ICD-10-CM

## 2014-03-10 DIAGNOSIS — I10 Essential (primary) hypertension: Secondary | ICD-10-CM
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
## 2014-03-10 NOTE — Patient Instructions (Signed)
Your physician recommends that you continue on your current medications as directed. Please refer to the Current Medication list given to you today.  Your physician wants you to follow-up in: 1 year with Dr.Smith You will receive a reminder letter in the mail two months in advance. If you don't receive a letter, please call our office to schedule the follow-up appointment.  The other recommended anticoagulant medications are : Pradaxa or Eliquis

## 2014-03-11 ENCOUNTER — Ambulatory Visit: Payer: Medicare Other | Admitting: Physical Therapy

## 2014-03-11 DIAGNOSIS — IMO0001 Reserved for inherently not codable concepts without codable children: Secondary | ICD-10-CM | POA: Diagnosis not present

## 2014-03-14 ENCOUNTER — Ambulatory Visit: Payer: Medicare Other | Admitting: Rehabilitative and Restorative Service Providers"

## 2014-03-15 ENCOUNTER — Other Ambulatory Visit: Payer: Self-pay | Admitting: Internal Medicine

## 2014-03-15 ENCOUNTER — Ambulatory Visit
Admission: RE | Admit: 2014-03-15 | Discharge: 2014-03-15 | Disposition: A | Discharge status: Home / Self Care | Payer: Medicare Other | Source: Ambulatory Visit | Attending: Internal Medicine | Admitting: Internal Medicine

## 2014-03-15 DIAGNOSIS — M79622 Pain in left upper arm: Secondary | ICD-10-CM

## 2014-03-15 DIAGNOSIS — M79602 Pain in left arm: Secondary | ICD-10-CM

## 2014-03-15 DIAGNOSIS — M79601 Pain in right arm: Secondary | ICD-10-CM

## 2014-03-16 ENCOUNTER — Encounter: Payer: Medicare Other | Admitting: Physical Therapy

## 2014-03-23 ENCOUNTER — Ambulatory Visit: Payer: Medicare Other | Admitting: Rehabilitative and Restorative Service Providers"

## 2014-03-23 DIAGNOSIS — IMO0001 Reserved for inherently not codable concepts without codable children: Secondary | ICD-10-CM | POA: Diagnosis not present

## 2014-03-25 ENCOUNTER — Ambulatory Visit: Payer: Medicare Other | Admitting: Rehabilitative and Restorative Service Providers"

## 2014-03-28 ENCOUNTER — Ambulatory Visit (INDEPENDENT_AMBULATORY_CARE_PROVIDER_SITE_OTHER): Payer: Medicare Other | Admitting: *Deleted

## 2014-03-28 DIAGNOSIS — R55 Syncope and collapse: Secondary | ICD-10-CM

## 2014-03-28 LAB — MDC_IDC_ENUM_SESS_TYPE_REMOTE

## 2014-03-29 ENCOUNTER — Ambulatory Visit: Payer: Medicare Other | Admitting: Physical Therapy

## 2014-03-29 DIAGNOSIS — IMO0001 Reserved for inherently not codable concepts without codable children: Secondary | ICD-10-CM | POA: Diagnosis not present

## 2014-03-31 ENCOUNTER — Ambulatory Visit: Payer: Medicare Other | Attending: Internal Medicine | Admitting: Physical Therapy

## 2014-03-31 DIAGNOSIS — Z96642 Presence of left artificial hip joint: Secondary | ICD-10-CM | POA: Insufficient documentation

## 2014-03-31 DIAGNOSIS — Z96651 Presence of right artificial knee joint: Secondary | ICD-10-CM | POA: Insufficient documentation

## 2014-03-31 DIAGNOSIS — I4891 Unspecified atrial fibrillation: Secondary | ICD-10-CM | POA: Diagnosis not present

## 2014-03-31 DIAGNOSIS — I10 Essential (primary) hypertension: Secondary | ICD-10-CM | POA: Insufficient documentation

## 2014-03-31 DIAGNOSIS — R269 Unspecified abnormalities of gait and mobility: Secondary | ICD-10-CM | POA: Insufficient documentation

## 2014-03-31 DIAGNOSIS — M6281 Muscle weakness (generalized): Secondary | ICD-10-CM | POA: Insufficient documentation

## 2014-04-05 ENCOUNTER — Ambulatory Visit: Payer: Medicare Other | Admitting: Rehabilitative and Restorative Service Providers"

## 2014-04-05 DIAGNOSIS — R269 Unspecified abnormalities of gait and mobility: Secondary | ICD-10-CM | POA: Diagnosis not present

## 2014-04-07 ENCOUNTER — Encounter: Payer: Medicare Other | Admitting: Physical Therapy

## 2014-04-08 NOTE — Progress Notes (Signed)
Loop recorder 

## 2014-04-15 ENCOUNTER — Encounter: Payer: Self-pay | Admitting: Internal Medicine

## 2014-04-19 ENCOUNTER — Encounter: Payer: Self-pay | Admitting: Internal Medicine

## 2014-04-27 ENCOUNTER — Ambulatory Visit (INDEPENDENT_AMBULATORY_CARE_PROVIDER_SITE_OTHER): Payer: Medicare Other | Admitting: *Deleted

## 2014-04-27 DIAGNOSIS — R55 Syncope and collapse: Secondary | ICD-10-CM

## 2014-05-02 NOTE — Progress Notes (Signed)
Loop recorder 

## 2014-05-03 LAB — MDC_IDC_ENUM_SESS_TYPE_REMOTE

## 2014-05-10 ENCOUNTER — Encounter: Payer: Self-pay | Admitting: Internal Medicine

## 2014-05-18 ENCOUNTER — Encounter: Payer: Self-pay | Admitting: Neurology

## 2014-05-18 ENCOUNTER — Ambulatory Visit (INDEPENDENT_AMBULATORY_CARE_PROVIDER_SITE_OTHER): Payer: Medicare Other | Admitting: Neurology

## 2014-05-18 VITALS — BP 122/72 | HR 72 | Temp 97.9°F | Resp 20 | Ht 63.0 in | Wt 180.0 lb

## 2014-05-18 DIAGNOSIS — M609 Myositis, unspecified: Secondary | ICD-10-CM

## 2014-05-18 DIAGNOSIS — M791 Myalgia: Secondary | ICD-10-CM

## 2014-05-18 DIAGNOSIS — S0990XS Unspecified injury of head, sequela: Secondary | ICD-10-CM

## 2014-05-18 DIAGNOSIS — G44309 Post-traumatic headache, unspecified, not intractable: Secondary | ICD-10-CM

## 2014-05-18 DIAGNOSIS — G63 Polyneuropathy in diseases classified elsewhere: Secondary | ICD-10-CM

## 2014-05-18 DIAGNOSIS — IMO0001 Reserved for inherently not codable concepts without codable children: Secondary | ICD-10-CM

## 2014-05-18 NOTE — Patient Instructions (Signed)
Myopathy Myopathy is a general term. It refers to any diseases of the muscles. The muscular dystrophies that run in families are one example. Another example are those diseases that produce redness, soreness, and swelling (inflammation) in the muscles. CAUSES  Myopathies can be acquired or passed down from parent to child (hereditary). It is unknown what causes the myopathies with inflammation. Myopathies can occur at birth or later in life. They may be caused by:  Endocrine disorders, such as thyroid disease.  Metabolic disorders, which are usually inherited.  Infection or inflammation of the muscle. This is often triggered by viruses or an immune system that attacks the muscles.  Certain drugs, such as lipid-lowering medicines. SYMPTOMS  General symptoms include weakness or pain of the limbs. These feelings are usually present close to the center of the body (proximal). Some people report that their myopathy happens during exercise. In some cases, the symptoms decrease as exercise increases. Depending upon the type, one muscle group may be more affected than another. In some cases, people have a myopathy with no symptoms. In the inherited myopathies, some family members may not be affected by symptoms. Other family members may have a range of symptoms. DIAGNOSIS  Diagnosis is based on your exam and symptoms.  Often, blood tests will be done. This is to measure muscle enzyme levels.  A test may be done to measure electrical activity of the muscle (electromyography, EMG).  A tissue sample (biopsy) of the affected muscle may be taken.  A computerized magnetic scan (MRI) may also be performed. TREATMENT  Treatments vary depending on the type of myopathy. In some cases, treatment to relieve symptoms may be all that is available or needed. Treatment for other forms of myopathy may include medicines. Immunosuppressive drugs and other disease-modifying antirheumatic drugs (DMARDs) may be used.  Physical therapy, bracing, or surgery may be needed. HOME CARE INSTRUCTIONS   Certain diets and exercises may be encouraged depending on the type of myopathy.  Sun exposure may be discouraged as it can cause rashes.  Physical therapy with a muscle strengthening program may be advised.  It is important to practice good general health to maintain a normal body weight. SEEK IMMEDIATE MEDICAL CARE IF:   You develop breathing problems.  You develop a rash.  You have a fever. FOR MORE INFORMATION  National Institute of Neurological Disorders and Stroke: www.ninds.nih.gov Document Released: 06/07/2002 Document Revised: 09/09/2011 Document Reviewed: 09/28/2009 ExitCare Patient Information 2015 [employer]. This information is not intended to replace advice given to you by your health care provider. Make sure you discuss any questions you have with your health care provider.  
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
## 2014-05-19 LAB — CK TOTAL AND CKMB (NOT AT ARMC)
CK MB INDEX: 7 ng/mL — AB (ref 0.0–5.3)
Total CK: 66 U/L (ref 24–173)

## 2014-05-19 LAB — C-REACTIVE PROTEIN: CRP: 23 mg/L — ABNORMAL HIGH (ref 0.0–4.9)

## 2014-05-19 LAB — SEDIMENTATION RATE: SED RATE: 27 mm/h (ref 0–40)

## 2014-05-19 LAB — ANA W/REFLEX IF POSITIVE: Anti Nuclear Antibody(ANA): NEGATIVE

## 2014-05-24 ENCOUNTER — Other Ambulatory Visit: Payer: Medicare Other

## 2014-05-24 ENCOUNTER — Other Ambulatory Visit: Payer: Self-pay | Admitting: Interventional Cardiology

## 2014-05-25 ENCOUNTER — Ambulatory Visit (INDEPENDENT_AMBULATORY_CARE_PROVIDER_SITE_OTHER): Payer: Self-pay | Admitting: Neurology

## 2014-05-25 ENCOUNTER — Ambulatory Visit (INDEPENDENT_AMBULATORY_CARE_PROVIDER_SITE_OTHER): Payer: Medicare Other | Admitting: Neurology

## 2014-05-25 ENCOUNTER — Encounter: Payer: Self-pay | Admitting: Neurology

## 2014-05-25 DIAGNOSIS — M5417 Radiculopathy, lumbosacral region: Secondary | ICD-10-CM

## 2014-05-25 DIAGNOSIS — S0990XS Unspecified injury of head, sequela: Secondary | ICD-10-CM

## 2014-05-25 DIAGNOSIS — G63 Polyneuropathy in diseases classified elsewhere: Secondary | ICD-10-CM

## 2014-05-25 DIAGNOSIS — IMO0001 Reserved for inherently not codable concepts without codable children: Secondary | ICD-10-CM

## 2014-05-25 DIAGNOSIS — G44309 Post-traumatic headache, unspecified, not intractable: Secondary | ICD-10-CM

## 2014-05-25 HISTORY — DX: Radiculopathy, lumbosacral region: M54.17

## 2014-05-25 NOTE — Procedures (Signed)
HISTORY:  Kristen Garrison is an 80 year old patient with a history of prior lumbosacral spine surgery many years ago, with a recent onset within the last 2 months of a gait disorder with multiple falls. She does report some low back pain without radiation down the legs. She feels weak "all over". She is being evaluated for the episodes of falling. She does report some numbness in the feet.  NERVE CONDUCTION STUDIES:  Nerve conduction studies were performed on the left upper extremity. The distal motor latencies and motor amplitudes for the median and ulnar nerves were within normal limits. The F wave latencies and nerve conduction velocities for these nerves were also normal. The sensory latencies for the median nerve was normal. The sensory latencies for the ulnar and radial nerves were slightly prolonged.  Nerve conduction studies were performed on both lower extremities. The distal motor latencies and motor amplitudes for the peroneal and posterior tibial nerves were within normal limits. The nerve conduction velocities for these nerves were also normal. The F wave latencies for the peroneal nerves bilaterally and for the right posterior tibial nerve were normal. The F wave latency for the left posterior tibial nerve was prolonged The sensory latencies for the peroneal nerves were within normal limits on the right, absent on the left.    EMG STUDIES:  EMG study was performed on the left lower extremity:  The tibialis anterior muscle reveals 2 to 4K motor units with full recruitment. No fibrillations or positive waves were seen. The peroneus tertius muscle reveals 2 to 5K motor units with decreased recruitment. No fibrillations or positive waves were seen. The medial gastrocnemius muscle reveals 1 to 4K motor units with full recruitment. 1+ fibrillations and positive waves were seen. The vastus lateralis muscle reveals 2 to 4K motor units with full recruitment. No fibrillations or  positive waves were seen. The iliopsoas muscle reveals 2 to 4K motor units with full recruitment. No fibrillations or positive waves were seen. The biceps femoris muscle (long head) reveals 2 to 4K motor units with full recruitment. No fibrillations or positive waves were seen. Complex repetitive discharges were seen. The lumbosacral paraspinal muscles were tested at 3 levels, and revealed no abnormalities of insertional activity at the upper and lower levels tested. 1+ positive waves were seen at the mid-level. There was good relaxation.  EMG study was performed on the right lower extremity:  The tibialis anterior muscle reveals 2 to 4K motor units with full recruitment. No fibrillations or positive waves were seen. The peroneus tertius muscle reveals 2 to 5K motor units with decreased recruitment. No fibrillations or positive waves were seen. The medial gastrocnemius muscle reveals up to 10K motor units with decreased recruitment. No fibrillations or positive waves were seen. The vastus lateralis muscle reveals 2 to 4K motor units with full recruitment. No fibrillations or positive waves were seen. The iliopsoas muscle reveals 2 to 4K motor units with full recruitment. No fibrillations or positive waves were seen. The biceps femoris muscle (long head) reveals 2 to 4K motor units with full recruitment. No fibrillations or positive waves were seen. The lumbosacral paraspinal muscles were tested at 3 levels, and revealed no abnormalities of insertional activity at all 3 levels tested. There was good relaxation.   IMPRESSION:  Nerve conduction studies done on the left upper extremity and on both lower extremities shows some sensory alteration in the arm and on the left leg, and the possibility of an early peripheral neuropathy is considered.  EMG evaluation of the left lower extremity shows findings consistent with a chronic and acute mild S1 radiculopathy. There is evidence of a primarily chronic S1  radiculopathy on the right. No evidence of a myopathic disorder is seen.  Marlan Palau. Keith Willis MD 05/25/2014 5:09 PM  [facility] [address] [place], [place] [id number]  Phone [phone] Fax [phone]

## 2014-05-25 NOTE — Progress Notes (Signed)
Please refer to EMG and nerve conduction study note. 

## 2014-05-27 ENCOUNTER — Ambulatory Visit (INDEPENDENT_AMBULATORY_CARE_PROVIDER_SITE_OTHER): Payer: Medicare Other | Admitting: *Deleted

## 2014-05-27 DIAGNOSIS — R55 Syncope and collapse: Secondary | ICD-10-CM

## 2014-05-27 LAB — MDC_IDC_ENUM_SESS_TYPE_REMOTE

## 2014-05-30 ENCOUNTER — Other Ambulatory Visit: Payer: Self-pay | Admitting: Interventional Cardiology

## 2014-06-03 ENCOUNTER — Encounter: Payer: Self-pay | Admitting: Internal Medicine

## 2014-06-03 NOTE — Progress Notes (Signed)
Loop recorder 

## 2014-06-09 ENCOUNTER — Encounter (HOSPITAL_COMMUNITY): Payer: Self-pay | Admitting: Internal Medicine

## 2014-06-16 ENCOUNTER — Encounter: Payer: Self-pay | Admitting: *Deleted

## 2014-06-18 ENCOUNTER — Other Ambulatory Visit: Payer: Self-pay | Admitting: Interventional Cardiology

## 2014-06-27 ENCOUNTER — Ambulatory Visit (INDEPENDENT_AMBULATORY_CARE_PROVIDER_SITE_OTHER): Payer: Medicare Other | Admitting: *Deleted

## 2014-06-27 DIAGNOSIS — R55 Syncope and collapse: Secondary | ICD-10-CM

## 2014-06-29 NOTE — Progress Notes (Signed)
Loop recorder 

## 2014-07-04 ENCOUNTER — Ambulatory Visit: Payer: Medicare Other | Admitting: Neurology

## 2014-07-15 LAB — MDC_IDC_ENUM_SESS_TYPE_REMOTE: Date Time Interrogation Session: 20151222050500

## 2014-07-20 DIAGNOSIS — R410 Disorientation, unspecified: Secondary | ICD-10-CM | POA: Diagnosis not present

## 2014-07-20 DIAGNOSIS — R799 Abnormal finding of blood chemistry, unspecified: Secondary | ICD-10-CM | POA: Diagnosis not present

## 2014-07-25 DIAGNOSIS — H4011X2 Primary open-angle glaucoma, moderate stage: Secondary | ICD-10-CM | POA: Diagnosis not present

## 2014-07-25 DIAGNOSIS — Z961 Presence of intraocular lens: Secondary | ICD-10-CM | POA: Diagnosis not present

## 2014-07-25 DIAGNOSIS — H26491 Other secondary cataract, right eye: Secondary | ICD-10-CM | POA: Diagnosis not present

## 2014-07-26 ENCOUNTER — Ambulatory Visit (INDEPENDENT_AMBULATORY_CARE_PROVIDER_SITE_OTHER): Payer: Medicare Other | Admitting: *Deleted

## 2014-07-26 DIAGNOSIS — R55 Syncope and collapse: Secondary | ICD-10-CM | POA: Diagnosis not present

## 2014-07-26 LAB — MDC_IDC_ENUM_SESS_TYPE_REMOTE

## 2014-07-28 ENCOUNTER — Ambulatory Visit (INDEPENDENT_AMBULATORY_CARE_PROVIDER_SITE_OTHER): Payer: 59 | Admitting: Adult Health

## 2014-07-28 ENCOUNTER — Encounter: Payer: Self-pay | Admitting: Adult Health

## 2014-07-28 ENCOUNTER — Encounter: Payer: Self-pay | Admitting: Internal Medicine

## 2014-07-28 ENCOUNTER — Ambulatory Visit: Payer: Medicare Other | Admitting: Adult Health

## 2014-07-28 VITALS — BP 104/63 | HR 62 | Ht 63.0 in | Wt 175.0 lb

## 2014-07-28 DIAGNOSIS — M5417 Radiculopathy, lumbosacral region: Secondary | ICD-10-CM | POA: Diagnosis not present

## 2014-07-28 DIAGNOSIS — M545 Low back pain, unspecified: Secondary | ICD-10-CM

## 2014-07-28 DIAGNOSIS — M549 Dorsalgia, unspecified: Secondary | ICD-10-CM | POA: Insufficient documentation

## 2014-07-28 DIAGNOSIS — R29898 Other symptoms and signs involving the musculoskeletal system: Secondary | ICD-10-CM | POA: Diagnosis not present

## 2014-07-28 NOTE — Progress Notes (Signed)
I agree with the assessment and plan as directed by NP .The patient is known to me .   Loyalty Arentz, MD  

## 2014-07-28 NOTE — Patient Instructions (Signed)
I will send a note to Dr. Madelon Lipsaffrey.  Please call to follow-up with him.

## 2014-07-28 NOTE — Progress Notes (Signed)
Loop recorder 
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
## 2014-07-29 ENCOUNTER — Encounter: Payer: Self-pay | Admitting: Internal Medicine

## 2014-08-03 DIAGNOSIS — H26491 Other secondary cataract, right eye: Secondary | ICD-10-CM | POA: Diagnosis not present

## 2014-08-03 DIAGNOSIS — H2640 Unspecified secondary cataract: Secondary | ICD-10-CM | POA: Diagnosis not present

## 2014-08-09 ENCOUNTER — Encounter: Payer: Self-pay | Admitting: Internal Medicine

## 2014-08-12 ENCOUNTER — Other Ambulatory Visit: Payer: Self-pay | Admitting: Internal Medicine

## 2014-08-16 DIAGNOSIS — M1990 Unspecified osteoarthritis, unspecified site: Secondary | ICD-10-CM | POA: Diagnosis not present

## 2014-08-16 DIAGNOSIS — I10 Essential (primary) hypertension: Secondary | ICD-10-CM | POA: Diagnosis not present

## 2014-08-16 DIAGNOSIS — J019 Acute sinusitis, unspecified: Secondary | ICD-10-CM | POA: Diagnosis not present

## 2014-08-25 ENCOUNTER — Ambulatory Visit (INDEPENDENT_AMBULATORY_CARE_PROVIDER_SITE_OTHER): Payer: Medicare Other | Admitting: *Deleted

## 2014-08-25 DIAGNOSIS — R55 Syncope and collapse: Secondary | ICD-10-CM | POA: Diagnosis not present

## 2014-08-25 LAB — MDC_IDC_ENUM_SESS_TYPE_REMOTE: Date Time Interrogation Session: 20160219050500

## 2014-08-26 NOTE — Progress Notes (Signed)
Loop recorder 

## 2014-08-30 DIAGNOSIS — I10 Essential (primary) hypertension: Secondary | ICD-10-CM | POA: Diagnosis not present

## 2014-08-30 DIAGNOSIS — G629 Polyneuropathy, unspecified: Secondary | ICD-10-CM | POA: Diagnosis not present

## 2014-09-13 ENCOUNTER — Encounter: Payer: Self-pay | Admitting: Internal Medicine

## 2014-09-20 ENCOUNTER — Encounter: Payer: Self-pay | Admitting: Internal Medicine

## 2014-09-23 ENCOUNTER — Ambulatory Visit (INDEPENDENT_AMBULATORY_CARE_PROVIDER_SITE_OTHER): Payer: Medicare Other | Admitting: *Deleted

## 2014-09-23 DIAGNOSIS — R55 Syncope and collapse: Secondary | ICD-10-CM

## 2014-09-23 LAB — MDC_IDC_ENUM_SESS_TYPE_REMOTE

## 2014-09-27 NOTE — Progress Notes (Signed)
Loop recorder 

## 2014-10-04 ENCOUNTER — Other Ambulatory Visit: Payer: Self-pay | Admitting: Interventional Cardiology

## 2014-10-07 ENCOUNTER — Encounter: Payer: Self-pay | Admitting: Internal Medicine

## 2014-10-24 ENCOUNTER — Ambulatory Visit (INDEPENDENT_AMBULATORY_CARE_PROVIDER_SITE_OTHER): Payer: Medicare Other | Admitting: *Deleted

## 2014-10-24 DIAGNOSIS — R55 Syncope and collapse: Secondary | ICD-10-CM | POA: Diagnosis not present

## 2014-10-26 NOTE — Progress Notes (Signed)
Loop recorder 

## 2014-10-31 DIAGNOSIS — G629 Polyneuropathy, unspecified: Secondary | ICD-10-CM | POA: Diagnosis not present

## 2014-10-31 DIAGNOSIS — I10 Essential (primary) hypertension: Secondary | ICD-10-CM | POA: Diagnosis not present

## 2014-11-03 DIAGNOSIS — H4011X2 Primary open-angle glaucoma, moderate stage: Secondary | ICD-10-CM | POA: Diagnosis not present

## 2014-11-15 ENCOUNTER — Other Ambulatory Visit: Payer: Self-pay | Admitting: Interventional Cardiology

## 2014-11-22 ENCOUNTER — Encounter: Payer: Self-pay | Admitting: Internal Medicine

## 2014-11-22 ENCOUNTER — Ambulatory Visit (INDEPENDENT_AMBULATORY_CARE_PROVIDER_SITE_OTHER): Payer: Medicare Other | Admitting: Internal Medicine

## 2014-11-22 VITALS — BP 120/50 | HR 66 | Ht 64.0 in | Wt 178.6 lb

## 2014-11-22 DIAGNOSIS — R55 Syncope and collapse: Secondary | ICD-10-CM

## 2014-11-22 DIAGNOSIS — G4710 Hypersomnia, unspecified: Secondary | ICD-10-CM

## 2014-11-22 DIAGNOSIS — Z4509 Encounter for adjustment and management of other cardiac device: Secondary | ICD-10-CM

## 2014-11-22 DIAGNOSIS — G478 Other sleep disorders: Secondary | ICD-10-CM | POA: Diagnosis not present

## 2014-11-22 DIAGNOSIS — I480 Paroxysmal atrial fibrillation: Secondary | ICD-10-CM | POA: Diagnosis not present

## 2014-11-22 DIAGNOSIS — G4730 Sleep apnea, unspecified: Secondary | ICD-10-CM

## 2014-11-22 DIAGNOSIS — R400 Somnolence: Secondary | ICD-10-CM

## 2014-11-22 LAB — CUP PACEART INCLINIC DEVICE CHECK
MDC IDC SESS DTM: 20160524161939
MDC IDC SET ZONE DETECTION INTERVAL: 3000 ms
Zone Setting Detection Interval: 2000 ms
Zone Setting Detection Interval: 410 ms
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
## 2014-11-22 NOTE — Patient Instructions (Addendum)
Medication Instructions:  Your physician has recommended you make the following change in your medication:  1) STOP Sotalol  Labwork: None ordered  Testing/Procedures: Your physician has recommended that you have a sleep study. This test records several body functions during sleep, including: brain activity, eye movement, oxygen and carbon dioxide blood levels, heart rate and rhythm, breathing rate and rhythm, the flow of air through your mouth and nose, snoring, body muscle movements, and chest and belly movement.  Follow-Up: Your physician wants you to follow-up in: 1 year with Dr. Graciela HusbandsKlein.  You will receive a reminder letter in the mail two months in advance. If you don't receive a letter, please call our office to schedule the follow-up appointment.   Any Other Special Instructions Will Be Listed Below (If Applicable). Serially exclude your Norvasc and Valsartan per Dr. Odessa FlemingKlein's instructions.  Thank you for choosing [facility]!!

## 2014-11-23 ENCOUNTER — Telehealth: Payer: Self-pay | Admitting: *Deleted

## 2014-11-23 ENCOUNTER — Encounter: Payer: Self-pay | Admitting: Internal Medicine

## 2014-11-23 ENCOUNTER — Ambulatory Visit (INDEPENDENT_AMBULATORY_CARE_PROVIDER_SITE_OTHER): Payer: Medicare Other | Admitting: *Deleted

## 2014-11-23 DIAGNOSIS — R55 Syncope and collapse: Secondary | ICD-10-CM

## 2014-11-23 NOTE — Telephone Encounter (Signed)
Informed patient that Dr. Graciela HusbandsKlein meant to say Valsartan, not Losartan. Informed her that I would update yesterday's office visit note. Patient verbalized understanding and agreeable.

## 2014-11-23 NOTE — Telephone Encounter (Signed)
Patient would like to know if Dr Graciela HusbandsKlein wanted her to try losartan. Per note "We will also take a serial exclusion of amlodipine and losartan to see if either is contributing To fatigue", but she is not on losartan. Please advise. Thanks, MI

## 2014-11-24 LAB — CUP PACEART REMOTE DEVICE CHECK: Date Time Interrogation Session: 20160426040500

## 2014-11-25 NOTE — Progress Notes (Signed)
Loop recorder 

## 2014-11-29 ENCOUNTER — Telehealth: Payer: Self-pay | Admitting: *Deleted

## 2014-11-29 DIAGNOSIS — I480 Paroxysmal atrial fibrillation: Secondary | ICD-10-CM

## 2014-11-29 NOTE — Telephone Encounter (Signed)
Kristen Garrison reports going in and out of rhythm since stopping Sotalol last week. Reports episodes on: Saturday at 10am, Sunday at 6:30am & 3:30pm, Monday at 10:30am.  All episodes lasting less than 10 min and relieved with rest. Kristen Garrison concerned cause she has not had these in years. She is aware I will call her once reviewed by Dr. Graciela HusbandsKlein.

## 2014-11-29 NOTE — Telephone Encounter (Signed)
Patient called and stated that she does not think that the med changes that were recently made are working out for her. She has been episodes of tachycardia, that she has not experienced in years. Please call back to discuss. Thanks, MI

## 2014-11-30 NOTE — Telephone Encounter (Signed)
Informed patient that we would like to obtain an EKG to see what rhythym she is in.  She agreed to come by office tomorrow for nurse visit/EKG.

## 2014-12-01 ENCOUNTER — Ambulatory Visit (INDEPENDENT_AMBULATORY_CARE_PROVIDER_SITE_OTHER): Payer: Medicare Other | Admitting: *Deleted

## 2014-12-01 DIAGNOSIS — I480 Paroxysmal atrial fibrillation: Secondary | ICD-10-CM

## 2014-12-01 NOTE — Progress Notes (Signed)
Patient came in for EKG, per Dr. Graciela HusbandsKlein.  Recent discontinuation of Sotalol on 5/24.  Patient had called in on Tuesday complaining of "episodes" since Saturday.  She reports "episode" on Saturday at 1000, Sunday at 0630 and 1530, & Monday 1030.  States that episodes lasting less than 10 min. EKG showing NSR with normal HR. She reports that she has hit her loop recorder button.  I checked with device clinic who states no reports since April and to have patient send transmission when she gets home so that we may see her patient activated reports. Informed patient to send transmission when she gets home tonight (as she has not done this) so that we may look at loop recorder recordings.  Patient agreed to send.  She is aware I will contact her next week once reviewed with Dr. Graciela HusbandsKlein.

## 2014-12-01 NOTE — Patient Instructions (Signed)
Medication Instructions:  Your physician recommends that you continue on your current medications as directed. Please refer to the Current Medication list given to you today.  Labwork: None ordered  Testing/Procedures: None ordered  Follow-Up: Continue current follow up plans  Any Other Special Instructions Will Be Listed Below (If Applicable). Please send loop recorder transmission when you get home.  Nurse will call you next week once reviewed with Dr. Graciela HusbandsKlein.

## 2014-12-06 DIAGNOSIS — F329 Major depressive disorder, single episode, unspecified: Secondary | ICD-10-CM | POA: Diagnosis not present

## 2014-12-06 DIAGNOSIS — I4891 Unspecified atrial fibrillation: Secondary | ICD-10-CM | POA: Diagnosis not present

## 2014-12-06 DIAGNOSIS — I10 Essential (primary) hypertension: Secondary | ICD-10-CM | POA: Diagnosis not present

## 2014-12-06 LAB — CUP PACEART REMOTE DEVICE CHECK: MDC IDC SESS DTM: 20160604040500

## 2014-12-08 ENCOUNTER — Telehealth: Payer: Self-pay | Admitting: *Deleted

## 2014-12-08 NOTE — Telephone Encounter (Signed)
Informed patient ekg strips and LINQ monitor report reviewed w/ Dr. Graciela Husbands.  Explained that we are not seeing any Atrial Fibrillation or other heart rate abnormalities. Advised to continue to use LINQ remote if she feels anything and we will review them. She states she has not been feeling anything like she did last week. She has agreed to press her monitor button if she experiences anything questionable.

## 2014-12-14 ENCOUNTER — Telehealth: Payer: Self-pay | Admitting: Internal Medicine

## 2014-12-14 NOTE — Telephone Encounter (Signed)
New Prob    Pt has questions regarding her Norvasc and Diovan medications. Please call.

## 2014-12-14 NOTE — Telephone Encounter (Signed)
I spoke with the pt and she took Norvasc for 2 weeks and did not feel any difference. The pt is currently taking Diovan and her two week trial will end on 12/21/14. The pt wanted to know what these medications are for and what she is suppose to do after 12/21/14. The pt said she saw her PCP last week and he said her BP was "a little high". The pt does not know the specific reading.  I advised the pt that it would also be beneficial to check her BP every other day while trying these medications.  This could also help in determining which medicine works for her.  The pt agreed and said she would retry Norvasc after 12/21/14.  The pt will contact the office after retrying this medication and then Dr Graciela Husbands can give further guidance on her medications.

## 2014-12-16 ENCOUNTER — Telehealth: Payer: Self-pay

## 2014-12-16 NOTE — Telephone Encounter (Signed)
Patient called in again today, still confused about her medication instructions from last visit with Dr. Graciela Husbands. Patient tells me she stopped Norvasc for 2 weeks without fatigue improvement -- advised patient she should restart this medication.  She verbalized understanding to begin taking this medication again.  Discussed what this medication was for, as she does not know. Instructed patient she should now stop taking Valsartan for 2 weeks to see if improvement in fatigue.  Advised her to call at the end of the 2 weeks and we would discuss what to do from there. She verbalized understanding and agreeable to plan.

## 2014-12-16 NOTE — Telephone Encounter (Signed)
Patient requesting to speak with Vida Roller RN

## 2014-12-19 DIAGNOSIS — S52124D Nondisplaced fracture of head of right radius, subsequent encounter for closed fracture with routine healing: Secondary | ICD-10-CM | POA: Diagnosis not present

## 2014-12-19 DIAGNOSIS — M545 Low back pain: Secondary | ICD-10-CM | POA: Diagnosis not present

## 2014-12-19 DIAGNOSIS — M7041 Prepatellar bursitis, right knee: Secondary | ICD-10-CM | POA: Diagnosis not present

## 2014-12-19 DIAGNOSIS — M25562 Pain in left knee: Secondary | ICD-10-CM | POA: Diagnosis not present

## 2014-12-23 ENCOUNTER — Ambulatory Visit (INDEPENDENT_AMBULATORY_CARE_PROVIDER_SITE_OTHER): Payer: Medicare Other | Admitting: *Deleted

## 2014-12-23 ENCOUNTER — Encounter: Payer: Self-pay | Admitting: Internal Medicine

## 2014-12-23 DIAGNOSIS — R55 Syncope and collapse: Secondary | ICD-10-CM

## 2014-12-27 ENCOUNTER — Encounter: Payer: Self-pay | Admitting: Internal Medicine

## 2014-12-27 LAB — CUP PACEART REMOTE DEVICE CHECK: MDC IDC SESS DTM: 20160622040500

## 2014-12-28 NOTE — Progress Notes (Signed)
Loop recorder 

## 2015-01-12 ENCOUNTER — Telehealth: Payer: Self-pay | Admitting: *Deleted

## 2015-01-12 NOTE — Telephone Encounter (Signed)
LINQ alert for tachy episode on 01-08-15 around 1029. Per pt no symptoms associated with episode.

## 2015-01-17 ENCOUNTER — Encounter: Payer: Self-pay | Admitting: Internal Medicine

## 2015-01-17 DIAGNOSIS — M25562 Pain in left knee: Secondary | ICD-10-CM | POA: Diagnosis not present

## 2015-01-23 ENCOUNTER — Ambulatory Visit (INDEPENDENT_AMBULATORY_CARE_PROVIDER_SITE_OTHER): Payer: Medicare Other | Admitting: *Deleted

## 2015-01-23 ENCOUNTER — Encounter: Payer: Self-pay | Admitting: Internal Medicine

## 2015-01-23 DIAGNOSIS — R55 Syncope and collapse: Secondary | ICD-10-CM | POA: Diagnosis not present

## 2015-01-27 ENCOUNTER — Encounter: Payer: Self-pay | Admitting: Internal Medicine

## 2015-01-30 ENCOUNTER — Encounter: Payer: Self-pay | Admitting: Internal Medicine

## 2015-01-31 ENCOUNTER — Telehealth: Payer: Self-pay | Admitting: Internal Medicine

## 2015-01-31 NOTE — Telephone Encounter (Signed)
Left msg on pt's home# and cell#.  Per [facility] website, no precert required for pt's in lab sleep study.

## 2015-02-02 ENCOUNTER — Ambulatory Visit (HOSPITAL_BASED_OUTPATIENT_CLINIC_OR_DEPARTMENT_OTHER): Payer: Medicare Other | Attending: Internal Medicine | Admitting: Radiology

## 2015-02-02 ENCOUNTER — Encounter (HOSPITAL_BASED_OUTPATIENT_CLINIC_OR_DEPARTMENT_OTHER): Payer: Medicare Other

## 2015-02-02 VITALS — Ht 64.0 in | Wt 175.0 lb

## 2015-02-02 DIAGNOSIS — I491 Atrial premature depolarization: Secondary | ICD-10-CM | POA: Insufficient documentation

## 2015-02-02 DIAGNOSIS — G4730 Sleep apnea, unspecified: Secondary | ICD-10-CM

## 2015-02-02 DIAGNOSIS — R0683 Snoring: Secondary | ICD-10-CM | POA: Diagnosis not present

## 2015-02-02 DIAGNOSIS — G478 Other sleep disorders: Secondary | ICD-10-CM | POA: Diagnosis not present

## 2015-02-02 DIAGNOSIS — G4733 Obstructive sleep apnea (adult) (pediatric): Secondary | ICD-10-CM | POA: Diagnosis not present

## 2015-02-02 DIAGNOSIS — Z4509 Encounter for adjustment and management of other cardiac device: Secondary | ICD-10-CM

## 2015-02-02 DIAGNOSIS — R400 Somnolence: Secondary | ICD-10-CM

## 2015-02-02 DIAGNOSIS — I480 Paroxysmal atrial fibrillation: Secondary | ICD-10-CM

## 2015-02-02 DIAGNOSIS — R55 Syncope and collapse: Secondary | ICD-10-CM

## 2015-02-02 DIAGNOSIS — G4719 Other hypersomnia: Secondary | ICD-10-CM | POA: Diagnosis present

## 2015-02-07 NOTE — Progress Notes (Signed)
Loop recorder 

## 2015-02-13 ENCOUNTER — Encounter: Payer: Self-pay | Admitting: Internal Medicine

## 2015-02-13 LAB — CUP PACEART REMOTE DEVICE CHECK: Date Time Interrogation Session: 20160806040500

## 2015-02-15 ENCOUNTER — Encounter: Payer: Self-pay | Admitting: Internal Medicine

## 2015-02-20 DIAGNOSIS — G629 Polyneuropathy, unspecified: Secondary | ICD-10-CM | POA: Diagnosis not present

## 2015-02-20 DIAGNOSIS — I4891 Unspecified atrial fibrillation: Secondary | ICD-10-CM | POA: Diagnosis not present

## 2015-02-20 DIAGNOSIS — I10 Essential (primary) hypertension: Secondary | ICD-10-CM | POA: Diagnosis not present

## 2015-02-20 NOTE — Sleep Study (Signed)
NAME: Kristen Garrison DATE OF BIRTH:  02-06-1927 MEDICAL RECORD NUMBER [id number]  LOCATION: [facility]  PHYSICIAN: KELLY,THOMAS A  DATE OF STUDY: 02/02/2015  SLEEP STUDY TYPE: Nocturnal Polysomnogram               REFERRING PHYSICIAN: Duke Salvia, MD   Patient Name: Kristen Garrison, Kristen Garrison Study Date: 02/02/2015 Gender: Female D.O.B: 02/20/1927 Age (years): 88 Referring Provider: Sherryl Manges Height (inches): 64 Interpreting Physician: Nicki Guadalajara MD, ABSM Weight (lbs): 175 RPSGT: Ulyess Mort BMI: 30 MRN: [id number] Neck Size: 15.00 CLINICAL INFORMATION Sleep Study Type: NPSG   Indication for sleep study: Hypertension, PAF, Fatigue with daytime sleepiness.   Epworth Sleepiness Score: 6   SLEEP STUDY TECHNIQUE As per the AASM Manual for the Scoring of Sleep and Associated Events v2.3 (April 2016) with a hypopnea requiring 4% desaturations.  The channels recorded and monitored were frontal, central and occipital EEG, electrooculogram (EOG), submentalis EMG (chin), nasal and oral airflow, thoracic and abdominal wall motion, anterior tibialis EMG, snore microphone, electrocardiogram, and pulse oximetry.  MEDICATIONS Patient's medications include:  acetaminophen (TYLENOL) 500 MG tablet 1,000 mg, Every 6 hours PRN     amLODipine (NORVASC) 5 MG tablet      brimonidine (ALPHAGAN P) 0.1 % SOLN 1 drop, 2 times daily     clorazepate (TRANXENE) 3.75 MG tablet 3.75 mg, As needed     etodolac (LODINE) 400 MG tablet 400 mg, Daily PRN     FLUoxetine (PROZAC) 10 MG capsule 10 mg, 2 times daily     furosemide (LASIX) 40 MG tablet 40 mg, Daily     HYDROcodone-acetaminophen (NORCO/VICODIN) 5-325 MG per tablet 0.05-1 tablet, Every 4 hours PRN     KLOR-CON M20 20 MEQ tablet 1 tablet, As directed     Note: Received from: External Pharmacy Received Sig: (Written 11/22/2014 1539)   latanoprost (XALATAN) 0.005 % ophthalmic solution 1 drop, Daily at bedtime     pantoprazole (PROTONIX) 40 MG tablet 40 mg, 2 times daily     predniSONE (DELTASONE) 5 MG tablet 5 mg, Daily     Note: Received from: External Pharmacy Received Sig: (Written 03/10/2014 1125)   traMADol (ULTRAM) 50 MG tablet 50 mg, 2 times daily PRN     Travoprost, BAK Free, (TRAVATAN) 0.004 % SOLN ophthalmic solution 1 drop, Daily at bedtime     valsartan (DIOVAN) 160 MG tablet 160 mg, Daily     XARELTO 15 MG TABS tablet   Medications self-administered by patient during sleep study : No sleep medicine administered.  SLEEP ARCHITECTURE The study was initiated at 10:37:00 PM and ended at 4:50:34 AM.  Sleep onset time was 27.5 minutes and the sleep efficiency was reduced at 66.0%. The total sleep time was 246.5 minutes.  Stage REM latency was 57.0 minutes.  The patient spent 6.49% of the night in stage N1 sleep, 73.43% in stage N2 sleep, 0.00% in stage N3 and 20.08% in REM.  Wake after sleep onset (WASO): 99.6 minutes  Alpha intrusion was absent.  Supine sleep was 100.00%.  RESPIRATORY PARAMETERS The overall apnea/hypopnea index (AHI) was 5.4 per hour which places the patient in the mild sleep apnea category. There were 10 total apneas, including 5 obstructive, 2 central and 3 mixed apneas. There were 12 hypopneas and 26 RERAs.  The AHI during Stage REM sleep was 10.9 per hour.  AHI while supine was 5.4 per hour.  The mean oxygen saturation was 93.18%. The minimum SpO2 during  sleep was 89.00%.  Moderate snoring was noted during this study.  CARDIAC DATA The 2 lead EKG demonstrated sinus rhythm. The mean heart rate was 62.20 beats per minute. Other EKG findings include: None.  LEG MOVEMENT DATA The total PLMS were 121 with a resulting PLMS index of 29.45. Associated arousal with leg movement index was 2.4 .  IMPRESSIONS Mild obstructive sleep apnea/hypopnea syndrome with and AHI of 5.4/hr overall and 10.9/hr during REM sleep. No significant central sleep apnea occurred during  this study (CAI = 0.5/h). Mild oxygen desaturation was noted during this study (Min O2 = 89.00%). Reduced sleep efficiency. The patient snored with Moderate snoring volume. Sinus rhythm with rare PAC. Moderate periodic limb movements of sleep occurred with an index of 29.45.  No significant associated arousals.  DIAGNOSIS Obstructive Sleep Apnea (327.23 [G47.33 ICD-10])  RECOMMENDATIONS Mild obstructive sleep apnea/hypopnea syndrome.  Would recommend  CPAP titration in this patient with cardiovascular co-morbidities including PAF but if patient is against CPAP initiation then consider customized oral appliance. Efforts should be made to optimize nasal and pharyngeal patency. Avoid alcohol, sedatives and other CNS depressants that may worsen sleep apnea and disrupt normal sleep architecture. Sleep hygiene should be reviewed to assess factors that may improve sleep quality. If patient is symptomatic with restless legs with CPAP, consider a trial of therapy with an PLMS index of 29.45. Weight management and regular exercise should be initiated or continued if appropriate.  Lennette Bihari Diplomate, American Board of Sleep Medicine  ELECTRONICALLY SIGNED ON:  02/20/2015, 3:13 PM [facility] PH: [phone]   FX: [phone] ACCREDITED BY THE AMERICAN ACADEMY OF SLEEP MEDICINE

## 2015-02-21 ENCOUNTER — Ambulatory Visit (INDEPENDENT_AMBULATORY_CARE_PROVIDER_SITE_OTHER): Payer: Medicare Other | Admitting: *Deleted

## 2015-02-21 DIAGNOSIS — I480 Paroxysmal atrial fibrillation: Secondary | ICD-10-CM | POA: Diagnosis not present

## 2015-02-22 ENCOUNTER — Telehealth: Payer: Self-pay | Admitting: *Deleted

## 2015-02-22 DIAGNOSIS — Z23 Encounter for immunization: Secondary | ICD-10-CM | POA: Diagnosis not present

## 2015-02-22 DIAGNOSIS — I4891 Unspecified atrial fibrillation: Secondary | ICD-10-CM | POA: Diagnosis not present

## 2015-02-22 DIAGNOSIS — I10 Essential (primary) hypertension: Secondary | ICD-10-CM | POA: Diagnosis not present

## 2015-02-22 NOTE — Telephone Encounter (Signed)
Called patient regarding tachy episode seen on LINQ transmission from 02/19/15 at 21:24.  Patient asymptomatic.  Patient appreciative of call, aware to call with worsening symptoms, questions, or concerns.

## 2015-02-27 ENCOUNTER — Telehealth: Payer: Self-pay | Admitting: *Deleted

## 2015-02-27 LAB — CUP PACEART REMOTE DEVICE CHECK: Date Time Interrogation Session: 20160829095928

## 2015-02-27 NOTE — Telephone Encounter (Signed)
Called and spoke with both patient and husband notifying them of sleep study results. Patient states that she does not want " a machine". I explained how treating her OSA will be beneficial in treatment of her A-fib. They will think about being fitted for a oral appliance and call me back.

## 2015-02-27 NOTE — Progress Notes (Signed)
Called and spoke with patient and husband.  They will think about it and call me back.

## 2015-03-09 DIAGNOSIS — I10 Essential (primary) hypertension: Secondary | ICD-10-CM | POA: Diagnosis not present

## 2015-03-09 DIAGNOSIS — M1990 Unspecified osteoarthritis, unspecified site: Secondary | ICD-10-CM | POA: Diagnosis not present

## 2015-03-09 DIAGNOSIS — F419 Anxiety disorder, unspecified: Secondary | ICD-10-CM | POA: Diagnosis not present

## 2015-03-13 ENCOUNTER — Telehealth: Payer: Self-pay | Admitting: *Deleted

## 2015-03-13 NOTE — Telephone Encounter (Signed)
Called patient regarding tachy episodes seen on LINQ transmission from 03/04/15 and 03/09/15.  Patient reports taking all medications as prescribed. Patient states that "every once in awhile" she feels her heart rate speed up, but that she sits down and rests and "it goes back down".  Patient otherwise asymptomatic.  Patient states that she is waiting for a call from her PCP's office regarding setting up "help at home" because she and her husband are having increasing difficulty doing housework and her physician offered resources to them at her husband's recent appointment.  Encouraged patient to call with worsening symptoms, questions, or concerns.  Patient voices understanding and denies questions at this time.

## 2015-03-21 ENCOUNTER — Ambulatory Visit: Payer: Medicare Other | Admitting: Physical Therapy

## 2015-03-22 ENCOUNTER — Encounter: Payer: Self-pay | Admitting: Internal Medicine

## 2015-03-23 ENCOUNTER — Ambulatory Visit (INDEPENDENT_AMBULATORY_CARE_PROVIDER_SITE_OTHER): Payer: Medicare Other | Admitting: *Deleted

## 2015-03-23 ENCOUNTER — Ambulatory Visit: Payer: Medicare Other | Attending: Physical Therapy | Admitting: Physical Therapy

## 2015-03-23 ENCOUNTER — Encounter: Payer: Self-pay | Admitting: Internal Medicine

## 2015-03-23 DIAGNOSIS — R55 Syncope and collapse: Secondary | ICD-10-CM | POA: Diagnosis not present

## 2015-03-26 ENCOUNTER — Other Ambulatory Visit: Payer: Self-pay | Admitting: Interventional Cardiology

## 2015-03-27 DIAGNOSIS — H4011X2 Primary open-angle glaucoma, moderate stage: Secondary | ICD-10-CM | POA: Diagnosis not present

## 2015-03-27 DIAGNOSIS — Z961 Presence of intraocular lens: Secondary | ICD-10-CM | POA: Diagnosis not present

## 2015-03-27 NOTE — Telephone Encounter (Signed)
Baird Lyons, RN at 12/16/2014 4:17 PM   Patient called in again today, still confused about her medication instructions from last visit with Dr. Graciela Husbands. Patient tells me she stopped Norvasc for 2 weeks without fatigue improvement -- advised patient she should restart this medication. She verbalized understanding to begin taking this medication again. Discussed what this medication was for, as she does not know. Instructed patient she should now stop taking Valsartan for 2 weeks to see if improvement in fatigue. Advised her to call at the end of the 2 weeks and we would discuss what to do from there. She verbalized understanding and agreeable to plan.  Duke Salvia, MD at 11/22/2014 4:08 PM  amLODipine (NORVASC) 5 MG tabletTAKE 1 TABLET BY MOUTH EVERY DAY

## 2015-03-27 NOTE — Progress Notes (Signed)
Loop recorder 

## 2015-03-30 DIAGNOSIS — Z1231 Encounter for screening mammogram for malignant neoplasm of breast: Secondary | ICD-10-CM | POA: Diagnosis not present

## 2015-03-31 DIAGNOSIS — R269 Unspecified abnormalities of gait and mobility: Secondary | ICD-10-CM | POA: Diagnosis not present

## 2015-03-31 DIAGNOSIS — I4891 Unspecified atrial fibrillation: Secondary | ICD-10-CM | POA: Diagnosis not present

## 2015-03-31 DIAGNOSIS — R531 Weakness: Secondary | ICD-10-CM | POA: Diagnosis not present

## 2015-03-31 DIAGNOSIS — I10 Essential (primary) hypertension: Secondary | ICD-10-CM | POA: Diagnosis not present

## 2015-03-31 DIAGNOSIS — F419 Anxiety disorder, unspecified: Secondary | ICD-10-CM | POA: Diagnosis not present

## 2015-04-03 ENCOUNTER — Other Ambulatory Visit: Payer: Self-pay

## 2015-04-05 ENCOUNTER — Encounter: Payer: Self-pay | Admitting: Interventional Cardiology

## 2015-04-05 ENCOUNTER — Ambulatory Visit (INDEPENDENT_AMBULATORY_CARE_PROVIDER_SITE_OTHER): Payer: Medicare Other | Admitting: Interventional Cardiology

## 2015-04-05 VITALS — BP 120/60 | HR 75 | Ht 64.0 in | Wt 180.8 lb

## 2015-04-05 DIAGNOSIS — R55 Syncope and collapse: Secondary | ICD-10-CM

## 2015-04-05 DIAGNOSIS — I4891 Unspecified atrial fibrillation: Secondary | ICD-10-CM | POA: Diagnosis not present

## 2015-04-05 DIAGNOSIS — R531 Weakness: Secondary | ICD-10-CM | POA: Diagnosis not present

## 2015-04-05 DIAGNOSIS — Z4509 Encounter for adjustment and management of other cardiac device: Secondary | ICD-10-CM

## 2015-04-05 DIAGNOSIS — I495 Sick sinus syndrome: Secondary | ICD-10-CM

## 2015-04-05 DIAGNOSIS — I480 Paroxysmal atrial fibrillation: Secondary | ICD-10-CM | POA: Diagnosis not present

## 2015-04-05 DIAGNOSIS — I10 Essential (primary) hypertension: Secondary | ICD-10-CM | POA: Diagnosis not present

## 2015-04-05 DIAGNOSIS — R269 Unspecified abnormalities of gait and mobility: Secondary | ICD-10-CM | POA: Diagnosis not present

## 2015-04-05 DIAGNOSIS — F419 Anxiety disorder, unspecified: Secondary | ICD-10-CM | POA: Diagnosis not present

## 2015-04-05 NOTE — Patient Instructions (Signed)
Medication Instructions:  Your physician recommends that you continue on your current medications as directed. Please refer to the Current Medication list given to you today.   Labwork: None ordered  Testing/Procedures: None ordered  Follow-Up: Your physician wants you to follow-up in: 1 year with Dr.Smith You will receive a reminder letter in the mail two months in advance. If you don't receive a letter, please call our office to schedule the follow-up appointment.   Any Other Special Instructions Will Be Listed Below (If Applicable).   
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
## 2015-04-07 DIAGNOSIS — F419 Anxiety disorder, unspecified: Secondary | ICD-10-CM | POA: Diagnosis not present

## 2015-04-07 DIAGNOSIS — I4891 Unspecified atrial fibrillation: Secondary | ICD-10-CM | POA: Diagnosis not present

## 2015-04-07 DIAGNOSIS — R269 Unspecified abnormalities of gait and mobility: Secondary | ICD-10-CM | POA: Diagnosis not present

## 2015-04-07 DIAGNOSIS — R531 Weakness: Secondary | ICD-10-CM | POA: Diagnosis not present

## 2015-04-07 DIAGNOSIS — I10 Essential (primary) hypertension: Secondary | ICD-10-CM | POA: Diagnosis not present

## 2015-04-10 DIAGNOSIS — F419 Anxiety disorder, unspecified: Secondary | ICD-10-CM | POA: Diagnosis not present

## 2015-04-10 DIAGNOSIS — R269 Unspecified abnormalities of gait and mobility: Secondary | ICD-10-CM | POA: Diagnosis not present

## 2015-04-10 DIAGNOSIS — I4891 Unspecified atrial fibrillation: Secondary | ICD-10-CM | POA: Diagnosis not present

## 2015-04-10 DIAGNOSIS — R531 Weakness: Secondary | ICD-10-CM | POA: Diagnosis not present

## 2015-04-10 DIAGNOSIS — I10 Essential (primary) hypertension: Secondary | ICD-10-CM | POA: Diagnosis not present

## 2015-04-11 ENCOUNTER — Encounter: Payer: Self-pay | Admitting: Internal Medicine

## 2015-04-13 DIAGNOSIS — R269 Unspecified abnormalities of gait and mobility: Secondary | ICD-10-CM | POA: Diagnosis not present

## 2015-04-13 DIAGNOSIS — F419 Anxiety disorder, unspecified: Secondary | ICD-10-CM | POA: Diagnosis not present

## 2015-04-13 DIAGNOSIS — I10 Essential (primary) hypertension: Secondary | ICD-10-CM | POA: Diagnosis not present

## 2015-04-13 DIAGNOSIS — R531 Weakness: Secondary | ICD-10-CM | POA: Diagnosis not present

## 2015-04-13 DIAGNOSIS — I4891 Unspecified atrial fibrillation: Secondary | ICD-10-CM | POA: Diagnosis not present

## 2015-04-17 DIAGNOSIS — I4891 Unspecified atrial fibrillation: Secondary | ICD-10-CM | POA: Diagnosis not present

## 2015-04-17 DIAGNOSIS — F419 Anxiety disorder, unspecified: Secondary | ICD-10-CM | POA: Diagnosis not present

## 2015-04-17 DIAGNOSIS — R269 Unspecified abnormalities of gait and mobility: Secondary | ICD-10-CM | POA: Diagnosis not present

## 2015-04-17 DIAGNOSIS — R531 Weakness: Secondary | ICD-10-CM | POA: Diagnosis not present

## 2015-04-17 DIAGNOSIS — I10 Essential (primary) hypertension: Secondary | ICD-10-CM | POA: Diagnosis not present

## 2015-04-19 ENCOUNTER — Encounter: Payer: Self-pay | Admitting: Internal Medicine

## 2015-04-21 ENCOUNTER — Other Ambulatory Visit: Payer: Self-pay | Admitting: Interventional Cardiology

## 2015-04-21 DIAGNOSIS — I4891 Unspecified atrial fibrillation: Secondary | ICD-10-CM | POA: Diagnosis not present

## 2015-04-21 DIAGNOSIS — F419 Anxiety disorder, unspecified: Secondary | ICD-10-CM | POA: Diagnosis not present

## 2015-04-21 DIAGNOSIS — R269 Unspecified abnormalities of gait and mobility: Secondary | ICD-10-CM | POA: Diagnosis not present

## 2015-04-21 DIAGNOSIS — I10 Essential (primary) hypertension: Secondary | ICD-10-CM | POA: Diagnosis not present

## 2015-04-21 DIAGNOSIS — R531 Weakness: Secondary | ICD-10-CM | POA: Diagnosis not present

## 2015-04-24 ENCOUNTER — Ambulatory Visit (INDEPENDENT_AMBULATORY_CARE_PROVIDER_SITE_OTHER): Payer: Medicare Other | Admitting: *Deleted

## 2015-04-24 DIAGNOSIS — I4891 Unspecified atrial fibrillation: Secondary | ICD-10-CM | POA: Diagnosis not present

## 2015-04-24 DIAGNOSIS — F419 Anxiety disorder, unspecified: Secondary | ICD-10-CM | POA: Diagnosis not present

## 2015-04-24 DIAGNOSIS — I10 Essential (primary) hypertension: Secondary | ICD-10-CM | POA: Diagnosis not present

## 2015-04-24 DIAGNOSIS — R55 Syncope and collapse: Secondary | ICD-10-CM

## 2015-04-24 DIAGNOSIS — R269 Unspecified abnormalities of gait and mobility: Secondary | ICD-10-CM | POA: Diagnosis not present

## 2015-04-24 DIAGNOSIS — R531 Weakness: Secondary | ICD-10-CM | POA: Diagnosis not present

## 2015-04-24 LAB — CUP PACEART REMOTE DEVICE CHECK: Date Time Interrogation Session: 20160922233936

## 2015-04-24 NOTE — Progress Notes (Signed)
Carelink summary report received. Battery status OK. Normal device function. No new symptom, brady, pause episodes. 2 tachy episodes, ECGs suggest PAF, +Xarelto, longest 12 sec, peak V 200bpm. Monthly summary reports and ROV with SK in 10/2015.

## 2015-04-25 NOTE — Progress Notes (Signed)
Loop recorder 

## 2015-04-26 DIAGNOSIS — I10 Essential (primary) hypertension: Secondary | ICD-10-CM | POA: Diagnosis not present

## 2015-04-26 DIAGNOSIS — Z0000 Encounter for general adult medical examination without abnormal findings: Secondary | ICD-10-CM | POA: Diagnosis not present

## 2015-04-26 DIAGNOSIS — R531 Weakness: Secondary | ICD-10-CM | POA: Diagnosis not present

## 2015-04-26 DIAGNOSIS — R799 Abnormal finding of blood chemistry, unspecified: Secondary | ICD-10-CM | POA: Diagnosis not present

## 2015-04-26 DIAGNOSIS — I4891 Unspecified atrial fibrillation: Secondary | ICD-10-CM | POA: Diagnosis not present

## 2015-04-26 DIAGNOSIS — H6120 Impacted cerumen, unspecified ear: Secondary | ICD-10-CM | POA: Diagnosis not present

## 2015-04-26 DIAGNOSIS — R269 Unspecified abnormalities of gait and mobility: Secondary | ICD-10-CM | POA: Diagnosis not present

## 2015-04-26 DIAGNOSIS — D510 Vitamin B12 deficiency anemia due to intrinsic factor deficiency: Secondary | ICD-10-CM | POA: Diagnosis not present

## 2015-04-26 DIAGNOSIS — D559 Anemia due to enzyme disorder, unspecified: Secondary | ICD-10-CM | POA: Diagnosis not present

## 2015-04-26 DIAGNOSIS — F419 Anxiety disorder, unspecified: Secondary | ICD-10-CM | POA: Diagnosis not present

## 2015-04-26 DIAGNOSIS — Z1329 Encounter for screening for other suspected endocrine disorder: Secondary | ICD-10-CM | POA: Diagnosis not present

## 2015-04-27 ENCOUNTER — Other Ambulatory Visit: Payer: Self-pay | Admitting: Interventional Cardiology

## 2015-04-28 NOTE — Telephone Encounter (Signed)
Spoke with patient and she stated that she is in the donut hole which is why her cost is so high. I mentioned that she could apply for patient assistance, but her and her husband declined as they feel that they may not qualify due to income. I made her aware that coumadin is the cheapest, but she declined that as well. She will continue with the xarelto.

## 2015-05-04 DIAGNOSIS — N390 Urinary tract infection, site not specified: Secondary | ICD-10-CM | POA: Diagnosis not present

## 2015-05-04 DIAGNOSIS — N209 Urinary calculus, unspecified: Secondary | ICD-10-CM | POA: Diagnosis not present

## 2015-05-04 DIAGNOSIS — D649 Anemia, unspecified: Secondary | ICD-10-CM | POA: Diagnosis not present

## 2015-05-04 DIAGNOSIS — G629 Polyneuropathy, unspecified: Secondary | ICD-10-CM | POA: Diagnosis not present

## 2015-05-05 DIAGNOSIS — F419 Anxiety disorder, unspecified: Secondary | ICD-10-CM | POA: Diagnosis not present

## 2015-05-05 DIAGNOSIS — I10 Essential (primary) hypertension: Secondary | ICD-10-CM | POA: Diagnosis not present

## 2015-05-05 DIAGNOSIS — R531 Weakness: Secondary | ICD-10-CM | POA: Diagnosis not present

## 2015-05-05 DIAGNOSIS — R269 Unspecified abnormalities of gait and mobility: Secondary | ICD-10-CM | POA: Diagnosis not present

## 2015-05-05 DIAGNOSIS — I4891 Unspecified atrial fibrillation: Secondary | ICD-10-CM | POA: Diagnosis not present

## 2015-05-08 DIAGNOSIS — F419 Anxiety disorder, unspecified: Secondary | ICD-10-CM | POA: Diagnosis not present

## 2015-05-08 DIAGNOSIS — R531 Weakness: Secondary | ICD-10-CM | POA: Diagnosis not present

## 2015-05-08 DIAGNOSIS — R269 Unspecified abnormalities of gait and mobility: Secondary | ICD-10-CM | POA: Diagnosis not present

## 2015-05-08 DIAGNOSIS — I10 Essential (primary) hypertension: Secondary | ICD-10-CM | POA: Diagnosis not present

## 2015-05-08 DIAGNOSIS — I4891 Unspecified atrial fibrillation: Secondary | ICD-10-CM | POA: Diagnosis not present

## 2015-05-12 DIAGNOSIS — I4891 Unspecified atrial fibrillation: Secondary | ICD-10-CM | POA: Diagnosis not present

## 2015-05-12 DIAGNOSIS — I10 Essential (primary) hypertension: Secondary | ICD-10-CM | POA: Diagnosis not present

## 2015-05-12 DIAGNOSIS — F419 Anxiety disorder, unspecified: Secondary | ICD-10-CM | POA: Diagnosis not present

## 2015-05-12 DIAGNOSIS — R531 Weakness: Secondary | ICD-10-CM | POA: Diagnosis not present

## 2015-05-12 DIAGNOSIS — R269 Unspecified abnormalities of gait and mobility: Secondary | ICD-10-CM | POA: Diagnosis not present

## 2015-05-17 DIAGNOSIS — R269 Unspecified abnormalities of gait and mobility: Secondary | ICD-10-CM | POA: Diagnosis not present

## 2015-05-17 DIAGNOSIS — F419 Anxiety disorder, unspecified: Secondary | ICD-10-CM | POA: Diagnosis not present

## 2015-05-17 DIAGNOSIS — R531 Weakness: Secondary | ICD-10-CM | POA: Diagnosis not present

## 2015-05-17 DIAGNOSIS — I10 Essential (primary) hypertension: Secondary | ICD-10-CM | POA: Diagnosis not present

## 2015-05-17 DIAGNOSIS — I4891 Unspecified atrial fibrillation: Secondary | ICD-10-CM | POA: Diagnosis not present

## 2015-05-19 DIAGNOSIS — I10 Essential (primary) hypertension: Secondary | ICD-10-CM | POA: Diagnosis not present

## 2015-05-19 DIAGNOSIS — I4891 Unspecified atrial fibrillation: Secondary | ICD-10-CM | POA: Diagnosis not present

## 2015-05-19 DIAGNOSIS — F419 Anxiety disorder, unspecified: Secondary | ICD-10-CM | POA: Diagnosis not present

## 2015-05-19 DIAGNOSIS — R531 Weakness: Secondary | ICD-10-CM | POA: Diagnosis not present

## 2015-05-19 DIAGNOSIS — R269 Unspecified abnormalities of gait and mobility: Secondary | ICD-10-CM | POA: Diagnosis not present

## 2015-05-22 ENCOUNTER — Ambulatory Visit (INDEPENDENT_AMBULATORY_CARE_PROVIDER_SITE_OTHER): Payer: Medicare Other | Admitting: *Deleted

## 2015-05-22 DIAGNOSIS — N390 Urinary tract infection, site not specified: Secondary | ICD-10-CM | POA: Diagnosis not present

## 2015-05-22 DIAGNOSIS — R55 Syncope and collapse: Secondary | ICD-10-CM

## 2015-05-22 DIAGNOSIS — R269 Unspecified abnormalities of gait and mobility: Secondary | ICD-10-CM | POA: Diagnosis not present

## 2015-05-22 DIAGNOSIS — F419 Anxiety disorder, unspecified: Secondary | ICD-10-CM | POA: Diagnosis not present

## 2015-05-22 DIAGNOSIS — I4891 Unspecified atrial fibrillation: Secondary | ICD-10-CM | POA: Diagnosis not present

## 2015-05-22 DIAGNOSIS — I10 Essential (primary) hypertension: Secondary | ICD-10-CM | POA: Diagnosis not present

## 2015-05-22 DIAGNOSIS — R531 Weakness: Secondary | ICD-10-CM | POA: Diagnosis not present

## 2015-05-22 DIAGNOSIS — D649 Anemia, unspecified: Secondary | ICD-10-CM | POA: Diagnosis not present

## 2015-05-22 DIAGNOSIS — G629 Polyneuropathy, unspecified: Secondary | ICD-10-CM | POA: Diagnosis not present

## 2015-05-23 DIAGNOSIS — R269 Unspecified abnormalities of gait and mobility: Secondary | ICD-10-CM | POA: Diagnosis not present

## 2015-05-23 DIAGNOSIS — I10 Essential (primary) hypertension: Secondary | ICD-10-CM | POA: Diagnosis not present

## 2015-05-23 DIAGNOSIS — I4891 Unspecified atrial fibrillation: Secondary | ICD-10-CM | POA: Diagnosis not present

## 2015-05-23 DIAGNOSIS — R531 Weakness: Secondary | ICD-10-CM | POA: Diagnosis not present

## 2015-05-23 DIAGNOSIS — F419 Anxiety disorder, unspecified: Secondary | ICD-10-CM | POA: Diagnosis not present

## 2015-05-23 NOTE — Progress Notes (Signed)
Carelink Summary Report / Loop Recorder 

## 2015-05-26 LAB — CUP PACEART REMOTE DEVICE CHECK: MDC IDC SESS DTM: 20161023000719

## 2015-05-26 NOTE — Progress Notes (Signed)
Carelink summary report received. Battery status OK. Normal device function. No new symptom, brady, or pause episodes. 2 tachy episodes--ECGs suggest AF w/RVR vs. SVT, +Xarelto, longest 34sec, peak V 207bpm. Monthly summary reports and ROV with SK in 10/2015.

## 2015-05-27 ENCOUNTER — Other Ambulatory Visit: Payer: Self-pay | Admitting: Interventional Cardiology

## 2015-05-29 DIAGNOSIS — R531 Weakness: Secondary | ICD-10-CM | POA: Diagnosis not present

## 2015-05-29 DIAGNOSIS — R269 Unspecified abnormalities of gait and mobility: Secondary | ICD-10-CM | POA: Diagnosis not present

## 2015-05-29 DIAGNOSIS — F419 Anxiety disorder, unspecified: Secondary | ICD-10-CM | POA: Diagnosis not present

## 2015-05-29 DIAGNOSIS — I10 Essential (primary) hypertension: Secondary | ICD-10-CM | POA: Diagnosis not present

## 2015-05-29 DIAGNOSIS — I4891 Unspecified atrial fibrillation: Secondary | ICD-10-CM | POA: Diagnosis not present

## 2015-06-21 ENCOUNTER — Ambulatory Visit (INDEPENDENT_AMBULATORY_CARE_PROVIDER_SITE_OTHER): Payer: Medicare Other | Admitting: *Deleted

## 2015-06-21 DIAGNOSIS — R55 Syncope and collapse: Secondary | ICD-10-CM

## 2015-06-22 NOTE — Progress Notes (Signed)
Carelink Summary Report / Loop Recorder 

## 2015-06-28 DIAGNOSIS — I10 Essential (primary) hypertension: Secondary | ICD-10-CM | POA: Diagnosis not present

## 2015-06-28 DIAGNOSIS — D559 Anemia due to enzyme disorder, unspecified: Secondary | ICD-10-CM | POA: Diagnosis not present

## 2015-06-28 DIAGNOSIS — D649 Anemia, unspecified: Secondary | ICD-10-CM | POA: Diagnosis not present

## 2015-06-29 LAB — CUP PACEART REMOTE DEVICE CHECK: MDC IDC SESS DTM: 20161122001055

## 2015-07-08 LAB — CUP PACEART REMOTE DEVICE CHECK: Date Time Interrogation Session: 20161222000714

## 2015-07-21 ENCOUNTER — Ambulatory Visit (INDEPENDENT_AMBULATORY_CARE_PROVIDER_SITE_OTHER): Payer: PPO | Admitting: *Deleted

## 2015-07-21 DIAGNOSIS — R55 Syncope and collapse: Secondary | ICD-10-CM

## 2015-07-25 NOTE — Progress Notes (Signed)
Carelink Summary Report / Loop Recorder 

## 2015-08-21 ENCOUNTER — Ambulatory Visit (INDEPENDENT_AMBULATORY_CARE_PROVIDER_SITE_OTHER): Payer: PPO | Admitting: *Deleted

## 2015-08-21 DIAGNOSIS — R55 Syncope and collapse: Secondary | ICD-10-CM | POA: Diagnosis not present

## 2015-08-23 NOTE — Progress Notes (Signed)
Carelink Summary Report / Loop Recorder 

## 2015-09-08 LAB — CUP PACEART REMOTE DEVICE CHECK: Date Time Interrogation Session: 20170121000636

## 2015-09-19 ENCOUNTER — Ambulatory Visit (INDEPENDENT_AMBULATORY_CARE_PROVIDER_SITE_OTHER): Payer: PPO | Admitting: *Deleted

## 2015-09-19 DIAGNOSIS — R55 Syncope and collapse: Secondary | ICD-10-CM | POA: Diagnosis not present

## 2015-09-20 NOTE — Progress Notes (Signed)
Carelink Summary Report / Loop Recorder 

## 2015-10-08 ENCOUNTER — Other Ambulatory Visit: Payer: Self-pay | Admitting: Internal Medicine

## 2015-10-19 ENCOUNTER — Ambulatory Visit (INDEPENDENT_AMBULATORY_CARE_PROVIDER_SITE_OTHER): Payer: PPO | Admitting: *Deleted

## 2015-10-19 DIAGNOSIS — R55 Syncope and collapse: Secondary | ICD-10-CM

## 2015-10-20 NOTE — Progress Notes (Signed)
Carelink Summary Report / Loop Recorder 

## 2015-11-03 LAB — CUP PACEART REMOTE DEVICE CHECK: MDC IDC SESS DTM: 20170220003945

## 2015-11-20 ENCOUNTER — Ambulatory Visit (INDEPENDENT_AMBULATORY_CARE_PROVIDER_SITE_OTHER): Payer: PPO | Admitting: *Deleted

## 2015-11-20 ENCOUNTER — Telehealth: Payer: Self-pay | Admitting: Internal Medicine

## 2015-11-20 DIAGNOSIS — R55 Syncope and collapse: Secondary | ICD-10-CM

## 2015-11-20 NOTE — Telephone Encounter (Signed)
New Message  Pt calling to see if remote transmission was sent successfully. Please call back and discuss.   

## 2015-11-20 NOTE — Telephone Encounter (Signed)
New Message  Pt returned call to determine if the transmission was received please call

## 2015-11-20 NOTE — Telephone Encounter (Signed)
Advised patient that transmission is received automatically when she sleeps near her monitor at night.  Patient verbalized understanding and appreciation.  She also wanted to reschedule her appointment with Dr. Graciela HusbandsKlein on 01/11/16.  Transferred patient to scheduling for further assistance.

## 2015-11-21 NOTE — Progress Notes (Signed)
Carelink Summary Report / Loop Recorder 

## 2015-11-24 ENCOUNTER — Telehealth: Payer: Self-pay | Admitting: Interventional Cardiology

## 2015-11-24 NOTE — Telephone Encounter (Signed)
New message   Request for surgical clearance:  1. What type of surgery is being performed? Total knee revision  - due to facture   2. When is this surgery scheduled? Pending   3. Are there any medications that need to be held prior to surgery and how long?xarelto    4. Name of physician performing surgery? Dr. Phylliss Bobowe- assisting / Dr. Hortencia Pilarassrey   5. What is your office phone and fax number? [phone] Valinda Hoar/fax [phone]

## 2015-11-25 LAB — CUP PACEART REMOTE DEVICE CHECK: MDC IDC SESS DTM: 20170322003719

## 2015-11-25 NOTE — Progress Notes (Signed)
Carelink summary report received. Battery status OK. Normal device function. No new symptom episodes, tachy episodes, brady, or pause episodes. No new AF episodes. Monthly summary reports and ROV/PRN 

## 2015-11-27 LAB — CUP PACEART REMOTE DEVICE CHECK: MDC IDC SESS DTM: 20170421010613

## 2015-11-27 NOTE — Progress Notes (Signed)
Carelink summary report received. Battery status OK. Normal device function. No new symptom episodes, brady, or pause episodes. 0.1% AF, +Xarelto, v rates elevated. Monthly summary reports and ROV/PRN

## 2015-11-28 NOTE — Telephone Encounter (Signed)
Okay to pause therapy with Xarelto for upcoming knee surgery. Hold for 48 hours.

## 2015-11-29 NOTE — Telephone Encounter (Signed)
Will fax this note to the number provided. 

## 2015-12-18 ENCOUNTER — Ambulatory Visit (INDEPENDENT_AMBULATORY_CARE_PROVIDER_SITE_OTHER): Payer: PPO | Admitting: *Deleted

## 2015-12-18 DIAGNOSIS — R55 Syncope and collapse: Secondary | ICD-10-CM

## 2015-12-19 NOTE — Progress Notes (Signed)
Carelink Summary Report / Loop Recorder 

## 2015-12-26 ENCOUNTER — Ambulatory Visit: Payer: Self-pay | Admitting: Physician Assistant
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
## 2015-12-30 LAB — CUP PACEART REMOTE DEVICE CHECK
Date Time Interrogation Session: 20170521013750
MDC IDC SESS DTM: 20170620013933

## 2016-01-01 ENCOUNTER — Encounter (HOSPITAL_COMMUNITY): Payer: Self-pay

## 2016-01-01 ENCOUNTER — Ambulatory Visit (HOSPITAL_COMMUNITY)
Admission: RE | Admit: 2016-01-01 | Discharge: 2016-01-01 | Disposition: A | Discharge status: Home / Self Care | Payer: PPO | Source: Ambulatory Visit | Attending: Physician Assistant | Admitting: Physician Assistant

## 2016-01-01 ENCOUNTER — Encounter (HOSPITAL_COMMUNITY)
Admission: RE | Admit: 2016-01-01 | Discharge: 2016-01-01 | Disposition: A | Discharge status: Home / Self Care | Payer: PPO | Source: Ambulatory Visit | Attending: Orthopedic Surgery | Admitting: Orthopedic Surgery

## 2016-01-01 DIAGNOSIS — Z01812 Encounter for preprocedural laboratory examination: Secondary | ICD-10-CM | POA: Diagnosis not present

## 2016-01-01 DIAGNOSIS — Z01810 Encounter for preprocedural cardiovascular examination: Secondary | ICD-10-CM | POA: Insufficient documentation

## 2016-01-01 DIAGNOSIS — Z01818 Encounter for other preprocedural examination: Secondary | ICD-10-CM | POA: Diagnosis not present

## 2016-01-01 DIAGNOSIS — T84012A Broken internal right knee prosthesis, initial encounter: Secondary | ICD-10-CM | POA: Diagnosis not present

## 2016-01-01 DIAGNOSIS — R9431 Abnormal electrocardiogram [ECG] [EKG]: Secondary | ICD-10-CM | POA: Insufficient documentation

## 2016-01-01 DIAGNOSIS — R938 Abnormal findings on diagnostic imaging of other specified body structures: Secondary | ICD-10-CM | POA: Insufficient documentation

## 2016-01-01 DIAGNOSIS — X58XXXA Exposure to other specified factors, initial encounter: Secondary | ICD-10-CM | POA: Diagnosis not present

## 2016-01-01 HISTORY — DX: Anxiety disorder, unspecified: F41.9

## 2016-01-01 HISTORY — DX: Polyneuropathy, unspecified: G62.9

## 2016-01-01 LAB — COMPREHENSIVE METABOLIC PANEL
ALBUMIN: 3.9 g/dL (ref 3.5–5.0)
ALK PHOS: 88 U/L (ref 38–126)
ALT: 12 U/L — ABNORMAL LOW (ref 14–54)
ANION GAP: 11 (ref 5–15)
AST: 20 U/L (ref 15–41)
BILIRUBIN TOTAL: 0.3 mg/dL (ref 0.3–1.2)
BUN: 24 mg/dL — ABNORMAL HIGH (ref 6–20)
CALCIUM: 9.4 mg/dL (ref 8.9–10.3)
CO2: 23 mmol/L (ref 22–32)
Chloride: 104 mmol/L (ref 101–111)
Creatinine, Ser: 1.24 mg/dL — ABNORMAL HIGH (ref 0.44–1.00)
GFR calc non Af Amer: 38 mL/min — ABNORMAL LOW (ref >60)
GFR, EST AFRICAN AMERICAN: 44 mL/min — AB (ref >60)
Glucose, Bld: 96 mg/dL (ref 65–99)
POTASSIUM: 4.3 mmol/L (ref 3.5–5.1)
SODIUM: 138 mmol/L (ref 135–145)
TOTAL PROTEIN: 7.2 g/dL (ref 6.5–8.1)

## 2016-01-01 LAB — CBC WITH DIFFERENTIAL/PLATELET
BASOS PCT: 0 %
Basophils Absolute: 0 10*3/uL (ref 0.0–0.1)
EOS ABS: 0.1 10*3/uL (ref 0.0–0.7)
Eosinophils Relative: 1 %
HEMATOCRIT: 39.8 % (ref 36.0–46.0)
HEMOGLOBIN: 12.4 g/dL (ref 12.0–15.0)
LYMPHS ABS: 2.9 10*3/uL (ref 0.7–4.0)
Lymphocytes Relative: 28 %
MCH: 28.6 pg (ref 26.0–34.0)
MCHC: 31.2 g/dL (ref 30.0–36.0)
MCV: 91.9 fL (ref 78.0–100.0)
MONO ABS: 0.7 10*3/uL (ref 0.1–1.0)
MONOS PCT: 6 %
NEUTROS PCT: 65 %
Neutro Abs: 6.7 10*3/uL (ref 1.7–7.7)
Platelets: 333 10*3/uL (ref 150–400)
RBC: 4.33 MIL/uL (ref 3.87–5.11)
RDW: 13.5 % (ref 11.5–15.5)
WBC: 10.3 10*3/uL (ref 4.0–10.5)

## 2016-01-01 LAB — URINALYSIS, ROUTINE W REFLEX MICROSCOPIC
GLUCOSE, UA: NEGATIVE mg/dL
HGB URINE DIPSTICK: NEGATIVE
Ketones, ur: NEGATIVE mg/dL
Nitrite: NEGATIVE
PH: 5.5 (ref 5.0–8.0)
Protein, ur: NEGATIVE mg/dL
SPECIFIC GRAVITY, URINE: 1.028 (ref 1.005–1.030)

## 2016-01-01 LAB — SURGICAL PCR SCREEN
MRSA, PCR: NEGATIVE
Staphylococcus aureus: NEGATIVE

## 2016-01-01 LAB — PROTIME-INR
INR: 2.07 — AB (ref 0.00–1.49)
Prothrombin Time: 23.1 seconds — ABNORMAL HIGH (ref 11.6–15.2)

## 2016-01-01 LAB — URINE MICROSCOPIC-ADD ON

## 2016-01-01 LAB — APTT: aPTT: 40 seconds — ABNORMAL HIGH (ref 24–37)

## 2016-01-01 LAB — ABO/RH: ABO/RH(D): O NEG

## 2016-01-01 NOTE — Progress Notes (Signed)
Patient's cardiologist is Dr Katrinka BlazingSmith. Patient was instructed to hold WitherbeeXareto 2 days prior to OR.  ( Patient told me when she is to stop it and it in a telephone call note from Dr Katrinka BlazingSmith to Dr Madelon Lipsaffrey.

## 2016-01-01 NOTE — Pre-Procedure Instructions (Signed)
    Kristen Garrison  01/01/2016    Your procedure is scheduled on Friday, July 14.  [facility] at 5:30 A.M.              Your surgery or procedure is scheduled for 7:30 AM   Call this number if you have problems the morning of [phone]                 For any other questions, please call [phone], Monday - Friday 8 AM - 4 PM.   Remember:  Do not eat food or drink liquids after midnight Thursday, July 13.  Take these medicines the morning of surgery with A SIP OF WATER :amLODipine (NORVASC),FLUoxetine (PROZAC),  pantoprazole (PROTONIX).  May ues eye drops.               Take if needed Clorazepate (tranxene) and pain medication.              Friday, July 7 Stop taking etodolac (LODINE).  Do not take Aspirin, Aspirin Products, Ibuprofen (Advil), Naproxen (Aleve), Vitamins, Herbal Products   Do not wear jewelry, make-up or nail polish.  Do not wear lotions, powders, or perfumes.    Do not shave 48 hours prior to surgery.    Do not bring valuables to the hospital.  [facility] is not responsible for any belongings or valuables.  Contacts, dentures or bridgework may not be worn into surgery.  Leave your suitcase in the car.  After surgery it may be brought to your room.  For patients admitted to the hospital, discharge time will be determined by your treatment team.  Special instructions:  [facility]- Preparing For Surgery   Please read over the following fact sheets that you were given: [facility]- Pre[facility] For [facility] and Patient Instructions for Mupirocin Application and Incentive Spirometry

## 2016-01-03 NOTE — Progress Notes (Addendum)
Anesthesia Chart Review:  Pt is an 82 year old female scheduled for R total knee revision on 01/12/2016 with Frederico Hammananiel Caffrey, MD.   Cardiologist is Verdis PrimeHenry Smith, MD who is aware of upcoming surgery. I messaged Dr. Katrinka BlazingSmith and obtained permission to stop xarelto 72 hours before surgery; I notified pt.   PMH includes:  Atrial fibrillation, HTN, loop recorder (last remote device check 12/19/15), GERD. Never smoker. BMI 30  Medications include: amlodipine, lasix, potassium, protonix, prednisone, valsartan, xarelto. Pt to stop xarelto 72 hours before surgery.   Preoperative labs reviewed.   - PTT 40, PT 23.1. Will repeat DOS.  - Pt has a UTI. [facility] Bow[facility]rey's office about this.   Chest x-ray 01/01/16 reviewed. Mild chronic bronchitic changes. New lower right paravertebral soft tissue density. This could reflect a hiatal hernia, lymphadenopathy or other mass, or be related to the descending thoracic aorta. Chest CT scanning is recommended.  -- [facility] Candise Bowensaffrey's office about abnormal CXR and need for f/u.   EKG 01/01/16: NSR. Left axis deviation. Non-specific ST-t changes  Echo 06/04/13:  - Left ventricle: The cavity size was normal. Systolic function was normal. The estimated ejection fraction was in the range of 60% to 65%. Wall motion was normal; there were no regional wall motion abnormalities. - Pericardium, extracardiac: A trivial pericardial effusion was identified posterior to the heart.  Carotid duplex 06/04/13: 1-39% internal carotid artery stenosis bilaterally.   If no changes, I anticipate pt can proceed with surgery as scheduled.   Rica Mastngela Chisom Muntean, FNP-BC [facility]/Anesthesiology Phone: [phone] 01/04/2016 4:46 PM

## 2016-01-04 LAB — URINE CULTURE: Culture: >=100000 — AB

## 2016-01-05 ENCOUNTER — Other Ambulatory Visit: Payer: Self-pay | Admitting: Orthopedic Surgery

## 2016-01-05 DIAGNOSIS — R9389 Abnormal findings on diagnostic imaging of other specified body structures: Secondary | ICD-10-CM

## 2016-01-09 ENCOUNTER — Ambulatory Visit
Admission: RE | Admit: 2016-01-09 | Discharge: 2016-01-09 | Disposition: A | Discharge status: Home / Self Care | Payer: PPO | Source: Ambulatory Visit | Attending: Orthopedic Surgery | Admitting: Orthopedic Surgery

## 2016-01-09 DIAGNOSIS — R9389 Abnormal findings on diagnostic imaging of other specified body structures: Secondary | ICD-10-CM

## 2016-01-10 ENCOUNTER — Encounter: Payer: PPO | Admitting: Internal Medicine

## 2016-01-10 ENCOUNTER — Ambulatory Visit (INDEPENDENT_AMBULATORY_CARE_PROVIDER_SITE_OTHER): Payer: PPO | Admitting: Internal Medicine

## 2016-01-10 ENCOUNTER — Encounter: Payer: Self-pay | Admitting: Internal Medicine

## 2016-01-10 VITALS — BP 122/67 | HR 74 | Ht 64.0 in | Wt 178.6 lb

## 2016-01-10 DIAGNOSIS — I480 Paroxysmal atrial fibrillation: Secondary | ICD-10-CM

## 2016-01-10 DIAGNOSIS — Z4509 Encounter for adjustment and management of other cardiac device: Secondary | ICD-10-CM | POA: Diagnosis not present

## 2016-01-10 LAB — CUP PACEART INCLINIC DEVICE CHECK: Date Time Interrogation Session: 20170712165743

## 2016-01-10 NOTE — Patient Instructions (Signed)
Medication Instructions: Your physician recommends that you continue on your current medications as directed. Please refer to the Current Medication list given to you today.  Labwork: None  Procedures/Testing: None  Follow-Up: Your physician wants you to follow-up in October with Dr. Garnette ScheuermannHank Smith  and 12 months with Merita Nortonenee Ursey PA. You will receive a reminder letter in the mail two months in advance. If you don't receive a letter, please call our office to schedule the follow-up appointment.  Any Additional Special Instructions Will Be Listed Below (If Applicable).     If you need a refill on your cardiac medications before your next appointment, please call your pharmacy.
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
## 2016-01-11 ENCOUNTER — Encounter: Payer: PPO | Admitting: Internal Medicine

## 2016-01-11 MED ORDER — SODIUM CHLORIDE 0.9 % IV SOLN
INTRAVENOUS | Status: DC
Start: 2016-01-12 — End: 2016-01-12

## 2016-01-11 MED ORDER — ACETAMINOPHEN 500 MG PO TABS
1000.0000 mg | ORAL_TABLET | Freq: Once | ORAL | Status: AC
  Administered 2016-01-12: 1000 mg via ORAL
  Filled 2016-01-11: qty 2

## 2016-01-11 MED ORDER — TRANEXAMIC ACID 1000 MG/10ML IV SOLN
1000.0000 mg | INTRAVENOUS | Status: AC
  Administered 2016-01-12: 1000 mg via INTRAVENOUS
  Filled 2016-01-11: qty 10

## 2016-01-11 MED ORDER — VANCOMYCIN HCL IN DEXTROSE 1-5 GM/200ML-% IV SOLN
1000.0000 mg | INTRAVENOUS | Status: AC
  Administered 2016-01-12: 1000 mg via INTRAVENOUS
  Filled 2016-01-11: qty 200

## 2016-01-12 ENCOUNTER — Encounter (HOSPITAL_COMMUNITY): Payer: Self-pay | Admitting: Surgery

## 2016-01-12 ENCOUNTER — Inpatient Hospital Stay (HOSPITAL_COMMUNITY)
Admission: RE | Admit: 2016-01-12 | Discharge: 2016-01-16 | DRG: 467 | Disposition: A | Discharge status: Skilled Nursing Facility | Payer: PPO | Source: Ambulatory Visit | Attending: Orthopedic Surgery | Admitting: Orthopedic Surgery

## 2016-01-12 ENCOUNTER — Inpatient Hospital Stay (HOSPITAL_COMMUNITY): Payer: PPO | Admitting: Vascular Surgery

## 2016-01-12 ENCOUNTER — Encounter (HOSPITAL_COMMUNITY)
Admission: RE | Disposition: A | Discharge status: Skilled Nursing Facility | Payer: Self-pay | Source: Ambulatory Visit | Attending: Orthopedic Surgery

## 2016-01-12 ENCOUNTER — Inpatient Hospital Stay (HOSPITAL_COMMUNITY): Payer: PPO

## 2016-01-12 ENCOUNTER — Inpatient Hospital Stay (HOSPITAL_COMMUNITY): Payer: PPO | Admitting: Emergency Medicine

## 2016-01-12 DIAGNOSIS — Z888 Allergy status to other drugs, medicaments and biological substances status: Secondary | ICD-10-CM

## 2016-01-12 DIAGNOSIS — R7989 Other specified abnormal findings of blood chemistry: Secondary | ICD-10-CM | POA: Diagnosis not present

## 2016-01-12 DIAGNOSIS — R749 Abnormal serum enzyme level, unspecified: Secondary | ICD-10-CM | POA: Diagnosis present

## 2016-01-12 DIAGNOSIS — D62 Acute posthemorrhagic anemia: Secondary | ICD-10-CM | POA: Diagnosis not present

## 2016-01-12 DIAGNOSIS — Z881 Allergy status to other antibiotic agents status: Secondary | ICD-10-CM | POA: Diagnosis not present

## 2016-01-12 DIAGNOSIS — I959 Hypotension, unspecified: Secondary | ICD-10-CM | POA: Diagnosis present

## 2016-01-12 DIAGNOSIS — T84032A Mechanical loosening of internal right knee prosthetic joint, initial encounter: Secondary | ICD-10-CM | POA: Diagnosis present

## 2016-01-12 DIAGNOSIS — Y838 Other surgical procedures as the cause of abnormal reaction of the patient, or of later complication, without mention of misadventure at the time of the procedure: Secondary | ICD-10-CM | POA: Diagnosis present

## 2016-01-12 DIAGNOSIS — J9811 Atelectasis: Secondary | ICD-10-CM | POA: Diagnosis not present

## 2016-01-12 DIAGNOSIS — I10 Essential (primary) hypertension: Secondary | ICD-10-CM | POA: Diagnosis present

## 2016-01-12 DIAGNOSIS — M1990 Unspecified osteoarthritis, unspecified site: Secondary | ICD-10-CM | POA: Diagnosis present

## 2016-01-12 DIAGNOSIS — I480 Paroxysmal atrial fibrillation: Secondary | ICD-10-CM | POA: Diagnosis present

## 2016-01-12 DIAGNOSIS — Z96659 Presence of unspecified artificial knee joint: Secondary | ICD-10-CM | POA: Insufficient documentation

## 2016-01-12 DIAGNOSIS — K219 Gastro-esophageal reflux disease without esophagitis: Secondary | ICD-10-CM | POA: Diagnosis present

## 2016-01-12 DIAGNOSIS — T84012A Broken internal right knee prosthesis, initial encounter: Secondary | ICD-10-CM

## 2016-01-12 DIAGNOSIS — R778 Other specified abnormalities of plasma proteins: Secondary | ICD-10-CM | POA: Insufficient documentation

## 2016-01-12 HISTORY — PX: TOTAL KNEE REVISION: SHX996

## 2016-01-12 LAB — APTT: aPTT: 29 seconds (ref 24–37)

## 2016-01-12 LAB — PROTIME-INR
INR: 1.04 (ref 0.00–1.49)
Prothrombin Time: 13.8 seconds (ref 11.6–15.2)

## 2016-01-12 SURGERY — TOTAL KNEE REVISION
Anesthesia: Spinal | Laterality: Right

## 2016-01-12 MED ORDER — PROPOFOL 500 MG/50ML IV EMUL
INTRAVENOUS | Status: DC | PRN
  Administered 2016-01-12: 75 ug/kg/min via INTRAVENOUS

## 2016-01-12 MED ORDER — PHENOL 1.4 % MT LIQD: 1.0000 | OROMUCOSAL | Status: DC | PRN

## 2016-01-12 MED ORDER — HYDROCODONE-ACETAMINOPHEN 5-325 MG PO TABS
1.0000 | ORAL_TABLET | ORAL | Status: DC | PRN
  Administered 2016-01-12 – 2016-01-15 (×4): 2 via ORAL
  Filled 2016-01-12 (×4): qty 2

## 2016-01-12 MED ORDER — FLEET ENEMA 7-19 GM/118ML RE ENEM: 1.0000 | ENEMA | Freq: Once | RECTAL | Status: DC | PRN

## 2016-01-12 MED ORDER — POTASSIUM CHLORIDE CRYS ER 20 MEQ PO TBCR
20.0000 meq | EXTENDED_RELEASE_TABLET | ORAL | Status: DC
  Administered 2016-01-15 – 2016-01-16 (×2): 20 meq via ORAL
  Filled 2016-01-12 (×2): qty 1

## 2016-01-12 MED ORDER — TRANEXAMIC ACID 1000 MG/10ML IV SOLN
1000.0000 mg | Freq: Once | INTRAVENOUS | Status: AC
  Administered 2016-01-12: 1000 mg via INTRAVENOUS
  Filled 2016-01-12: qty 10

## 2016-01-12 MED ORDER — HYDROMORPHONE HCL 1 MG/ML IJ SOLN
INTRAMUSCULAR | Status: AC
  Filled 2016-01-12: qty 1

## 2016-01-12 MED ORDER — LIDOCAINE HCL (CARDIAC) 20 MG/ML IV SOLN
INTRAVENOUS | Status: DC | PRN
  Administered 2016-01-12: 20 mg via INTRAVENOUS

## 2016-01-12 MED ORDER — ACETAMINOPHEN 325 MG PO TABS: 325.0000 mg | ORAL_TABLET | ORAL | Status: DC | PRN

## 2016-01-12 MED ORDER — ACETAMINOPHEN 650 MG RE SUPP: 650.0000 mg | Freq: Four times a day (QID) | RECTAL | Status: DC | PRN

## 2016-01-12 MED ORDER — ONDANSETRON HCL 4 MG/2ML IJ SOLN
INTRAMUSCULAR | Status: DC | PRN
  Administered 2016-01-12: 4 mg via INTRAVENOUS

## 2016-01-12 MED ORDER — BISACODYL 5 MG PO TBEC: 5.0000 mg | DELAYED_RELEASE_TABLET | Freq: Every day | ORAL | Status: DC | PRN

## 2016-01-12 MED ORDER — DOCUSATE SODIUM 100 MG PO CAPS
100.0000 mg | ORAL_CAPSULE | Freq: Two times a day (BID) | ORAL | Status: DC
  Administered 2016-01-12 – 2016-01-16 (×10): 100 mg via ORAL
  Filled 2016-01-12 (×9): qty 1

## 2016-01-12 MED ORDER — MIDAZOLAM HCL 2 MG/2ML IJ SOLN
INTRAMUSCULAR | Status: AC
  Filled 2016-01-12: qty 2

## 2016-01-12 MED ORDER — ADULT MULTIVITAMIN W/MINERALS CH
1.0000 | ORAL_TABLET | Freq: Every day | ORAL | Status: DC
  Administered 2016-01-13 – 2016-01-16 (×4): 1 via ORAL
  Filled 2016-01-12 (×4): qty 1

## 2016-01-12 MED ORDER — LACTATED RINGERS IV SOLN
INTRAVENOUS | Status: DC | PRN
  Administered 2016-01-12 (×3): via INTRAVENOUS

## 2016-01-12 MED ORDER — ONDANSETRON HCL 4 MG/2ML IJ SOLN
4.0000 mg | Freq: Four times a day (QID) | INTRAMUSCULAR | Status: DC | PRN
  Filled 2016-01-12: qty 2

## 2016-01-12 MED ORDER — ONDANSETRON HCL 4 MG/2ML IJ SOLN
INTRAMUSCULAR | Status: AC
  Filled 2016-01-12: qty 2

## 2016-01-12 MED ORDER — CLORAZEPATE DIPOTASSIUM 3.75 MG PO TABS
3.7500 mg | ORAL_TABLET | Freq: Two times a day (BID) | ORAL | Status: DC
  Administered 2016-01-12 – 2016-01-16 (×9): 3.75 mg via ORAL
  Filled 2016-01-12 (×9): qty 1

## 2016-01-12 MED ORDER — AMLODIPINE BESYLATE 5 MG PO TABS
5.0000 mg | ORAL_TABLET | Freq: Every day | ORAL | Status: DC
  Administered 2016-01-13 – 2016-01-16 (×2): 5 mg via ORAL
  Filled 2016-01-12 (×4): qty 1

## 2016-01-12 MED ORDER — SODIUM CHLORIDE 0.9 % IR SOLN
Status: DC | PRN
Start: 2016-01-12 — End: 2016-01-12
  Administered 2016-01-12: 3000 mL

## 2016-01-12 MED ORDER — HYDROMORPHONE HCL 1 MG/ML IJ SOLN
0.2500 mg | INTRAMUSCULAR | Status: DC | PRN
  Administered 2016-01-12 (×3): 0.5 mg via INTRAVENOUS

## 2016-01-12 MED ORDER — VENLAFAXINE HCL ER 75 MG PO CP24
75.0000 mg | ORAL_CAPSULE | Freq: Every day | ORAL | Status: DC
  Administered 2016-01-13 – 2016-01-16 (×4): 75 mg via ORAL
  Filled 2016-01-12 (×4): qty 1

## 2016-01-12 MED ORDER — OXYCODONE HCL 5 MG PO TABS
ORAL_TABLET | ORAL | Status: AC
  Filled 2016-01-12: qty 1

## 2016-01-12 MED ORDER — METOCLOPRAMIDE HCL 5 MG/ML IJ SOLN: 5.0000 mg | Freq: Three times a day (TID) | INTRAMUSCULAR | Status: DC | PRN

## 2016-01-12 MED ORDER — LATANOPROST 0.005 % OP SOLN
1.0000 [drp] | Freq: Every day | OPHTHALMIC | Status: DC
  Administered 2016-01-12 – 2016-01-15 (×4): 1 [drp] via OPHTHALMIC
  Filled 2016-01-12: qty 2.5

## 2016-01-12 MED ORDER — PROPOFOL 10 MG/ML IV BOLUS
INTRAVENOUS | Status: AC
  Filled 2016-01-12: qty 20

## 2016-01-12 MED ORDER — SODIUM CHLORIDE 0.9 % IV SOLN: INTRAVENOUS | Status: DC

## 2016-01-12 MED ORDER — ROCURONIUM BROMIDE 50 MG/5ML IV SOLN
INTRAVENOUS | Status: AC
  Filled 2016-01-12: qty 1

## 2016-01-12 MED ORDER — FENTANYL CITRATE (PF) 100 MCG/2ML IJ SOLN
INTRAMUSCULAR | Status: DC | PRN
  Administered 2016-01-12 (×2): 25 ug via INTRAVENOUS

## 2016-01-12 MED ORDER — HYDROCODONE-ACETAMINOPHEN 5-325 MG PO TABS: 1.0000 | ORAL_TABLET | ORAL | Status: DC | PRN

## 2016-01-12 MED ORDER — BUPIVACAINE IN DEXTROSE 0.75-8.25 % IT SOLN
INTRATHECAL | Status: DC | PRN
  Administered 2016-01-12: 1.8 mL via INTRATHECAL

## 2016-01-12 MED ORDER — RIVAROXABAN 15 MG PO TABS
15.0000 mg | ORAL_TABLET | Freq: Every day | ORAL | Status: DC
  Administered 2016-01-13 – 2016-01-15 (×3): 15 mg via ORAL
  Filled 2016-01-12 (×6): qty 1

## 2016-01-12 MED ORDER — IRBESARTAN 150 MG PO TABS
150.0000 mg | ORAL_TABLET | Freq: Every day | ORAL | Status: DC
  Administered 2016-01-13 – 2016-01-16 (×3): 150 mg via ORAL
  Filled 2016-01-12 (×4): qty 1

## 2016-01-12 MED ORDER — OXYCODONE HCL 5 MG/5ML PO SOLN: 5.0000 mg | Freq: Once | ORAL | Status: AC | PRN

## 2016-01-12 MED ORDER — DEXTROSE 5 % IV SOLN
10.0000 mg | INTRAVENOUS | Status: DC | PRN
  Administered 2016-01-12: 25 ug/min via INTRAVENOUS

## 2016-01-12 MED ORDER — BUPIVACAINE-EPINEPHRINE (PF) 0.25% -1:200000 IJ SOLN
INTRAMUSCULAR | Status: AC
  Filled 2016-01-12: qty 60

## 2016-01-12 MED ORDER — LIDOCAINE 2% (20 MG/ML) 5 ML SYRINGE
INTRAMUSCULAR | Status: AC
  Filled 2016-01-12: qty 5

## 2016-01-12 MED ORDER — CHLORHEXIDINE GLUCONATE 4 % EX LIQD: 60.0000 mL | Freq: Once | CUTANEOUS | Status: DC

## 2016-01-12 MED ORDER — MENTHOL 3 MG MT LOZG: 1.0000 | LOZENGE | OROMUCOSAL | Status: DC | PRN

## 2016-01-12 MED ORDER — FENTANYL CITRATE (PF) 250 MCG/5ML IJ SOLN
INTRAMUSCULAR | Status: AC
  Filled 2016-01-12: qty 5

## 2016-01-12 MED ORDER — ONDANSETRON HCL 4 MG PO TABS: 4.0000 mg | ORAL_TABLET | Freq: Four times a day (QID) | ORAL | Status: DC | PRN

## 2016-01-12 MED ORDER — TRANEXAMIC ACID 1000 MG/10ML IV SOLN
2000.0000 mg | INTRAVENOUS | Status: AC
  Administered 2016-01-12: 2000 mg via TOPICAL
  Filled 2016-01-12: qty 20

## 2016-01-12 MED ORDER — POLYETHYLENE GLYCOL 3350 17 G PO PACK: 17.0000 g | PACK | Freq: Every day | ORAL | Status: DC | PRN

## 2016-01-12 MED ORDER — BUPIVACAINE LIPOSOME 1.3 % IJ SUSP
20.0000 mL | INTRAMUSCULAR | Status: AC
  Administered 2016-01-12: 20 mL
  Filled 2016-01-12: qty 20

## 2016-01-12 MED ORDER — ACETAMINOPHEN 160 MG/5ML PO SOLN: 325.0000 mg | ORAL | Status: DC | PRN

## 2016-01-12 MED ORDER — FUROSEMIDE 40 MG PO TABS
60.0000 mg | ORAL_TABLET | Freq: Every day | ORAL | Status: DC
  Administered 2016-01-13 – 2016-01-16 (×4): 60 mg via ORAL
  Filled 2016-01-12 (×4): qty 1

## 2016-01-12 MED ORDER — PANTOPRAZOLE SODIUM 40 MG PO TBEC
40.0000 mg | DELAYED_RELEASE_TABLET | Freq: Two times a day (BID) | ORAL | Status: DC
  Administered 2016-01-12 – 2016-01-16 (×9): 40 mg via ORAL
  Filled 2016-01-12 (×9): qty 1

## 2016-01-12 MED ORDER — BUPIVACAINE-EPINEPHRINE 0.5% -1:200000 IJ SOLN
INTRAMUSCULAR | Status: DC | PRN
  Administered 2016-01-12: 50 mL

## 2016-01-12 MED ORDER — ACETAMINOPHEN 500 MG PO TABS: 1000.0000 mg | ORAL_TABLET | Freq: Four times a day (QID) | ORAL | Status: DC | PRN

## 2016-01-12 MED ORDER — PROPOFOL 10 MG/ML IV BOLUS
INTRAVENOUS | Status: DC | PRN
  Administered 2016-01-12 (×2): 20 mg via INTRAVENOUS

## 2016-01-12 MED ORDER — OXYCODONE HCL 5 MG PO TABS
5.0000 mg | ORAL_TABLET | Freq: Once | ORAL | Status: AC | PRN
  Administered 2016-01-12: 5 mg via ORAL

## 2016-01-12 MED ORDER — ACETAMINOPHEN 325 MG PO TABS
650.0000 mg | ORAL_TABLET | Freq: Four times a day (QID) | ORAL | Status: DC | PRN
  Administered 2016-01-13 – 2016-01-14 (×4): 650 mg via ORAL
  Filled 2016-01-12 (×4): qty 2

## 2016-01-12 MED ORDER — PREDNISONE 5 MG PO TABS
5.0000 mg | ORAL_TABLET | Freq: Every day | ORAL | Status: DC
  Administered 2016-01-13 – 2016-01-16 (×4): 5 mg via ORAL
  Filled 2016-01-12 (×4): qty 1

## 2016-01-12 MED ORDER — METOCLOPRAMIDE HCL 5 MG PO TABS: 5.0000 mg | ORAL_TABLET | Freq: Three times a day (TID) | ORAL | Status: DC | PRN

## 2016-01-12 SURGICAL SUPPLY — 66 items
BANDAGE ELASTIC 4 VELCRO ST LF (GAUZE/BANDAGES/DRESSINGS) ×2 IMPLANT
BANDAGE ELASTIC 6 VELCRO ST LF (GAUZE/BANDAGES/DRESSINGS) ×2 IMPLANT
BANDAGE ESMARK 6X9 LF (GAUZE/BANDAGES/DRESSINGS) ×1 IMPLANT
BLADE SAGITTAL 25.0X1.19X90 (BLADE) ×2 IMPLANT
BLADE SAW SAG 90X13X1.27 (BLADE) ×2 IMPLANT
BNDG ESMARK 6X9 LF (GAUZE/BANDAGES/DRESSINGS) ×2
BONE CEMENT GENTAMICIN (Cement) ×2 IMPLANT
BOWL SMART MIX CTS (DISPOSABLE) ×2 IMPLANT
CEMENT BONE GENTAMICIN 40 (Cement) ×1 IMPLANT
COVER SURGICAL LIGHT HANDLE (MISCELLANEOUS) ×2 IMPLANT
CUFF TOURNIQUET SINGLE 34IN LL (TOURNIQUET CUFF) ×2 IMPLANT
CUFF TOURNIQUET SINGLE 44IN (TOURNIQUET CUFF) IMPLANT
DRAPE INCISE IOBAN 66X45 STRL (DRAPES) IMPLANT
DRAPE ORTHO SPLIT 77X108 STRL (DRAPES) ×2
DRAPE SURG ORHT 6 SPLT 77X108 (DRAPES) ×2 IMPLANT
DRAPE U-SHAPE 47X51 STRL (DRAPES) ×2 IMPLANT
DRSG ADAPTIC 3X8 NADH LF (GAUZE/BANDAGES/DRESSINGS) ×2 IMPLANT
DRSG PAD ABDOMINAL 8X10 ST (GAUZE/BANDAGES/DRESSINGS) ×4 IMPLANT
DURAPREP 26ML APPLICATOR (WOUND CARE) ×2 IMPLANT
ELECT REM PT RETURN 9FT ADLT (ELECTROSURGICAL) ×2
ELECTRODE REM PT RTRN 9FT ADLT (ELECTROSURGICAL) ×1 IMPLANT
EVACUATOR 1/8 PVC DRAIN (DRAIN) IMPLANT
FACESHIELD WRAPAROUND (MASK) ×4 IMPLANT
FLOSEAL 10ML (HEMOSTASIS) IMPLANT
GAUZE SPONGE 4X4 12PLY STRL (GAUZE/BANDAGES/DRESSINGS) ×2 IMPLANT
GLOVE BIOGEL PI IND STRL 8 (GLOVE) ×4 IMPLANT
GLOVE BIOGEL PI INDICATOR 8 (GLOVE) ×4
GLOVE ORTHO TXT STRL SZ7.5 (GLOVE) ×2 IMPLANT
GLOVE SURG ORTHO 8.0 STRL STRW (GLOVE) ×2 IMPLANT
GOWN STRL REUS W/ TWL LRG LVL3 (GOWN DISPOSABLE) ×2 IMPLANT
GOWN STRL REUS W/ TWL XL LVL3 (GOWN DISPOSABLE) ×1 IMPLANT
GOWN STRL REUS W/TWL 2XL LVL3 (GOWN DISPOSABLE) ×2 IMPLANT
GOWN STRL REUS W/TWL LRG LVL3 (GOWN DISPOSABLE) ×2
GOWN STRL REUS W/TWL XL LVL3 (GOWN DISPOSABLE) ×1
HANDPIECE INTERPULSE COAX TIP (DISPOSABLE) ×1
HOOD PEEL AWAY FACE SHEILD DIS (HOOD) ×2 IMPLANT
IMMOBILIZER KNEE 22 UNIV (SOFTGOODS) IMPLANT
INSERT TIB LCS RP STD 15 (Knees) ×2 IMPLANT
KIT BASIN OR (CUSTOM PROCEDURE TRAY) ×2 IMPLANT
KIT ROOM TURNOVER OR (KITS) ×2 IMPLANT
MANIFOLD NEPTUNE II (INSTRUMENTS) ×2 IMPLANT
NEEDLE 22X1 1/2 (OR ONLY) (NEEDLE) IMPLANT
NS IRRIG 1000ML POUR BTL (IV SOLUTION) ×2 IMPLANT
PACK TOTAL JOINT (CUSTOM PROCEDURE TRAY) ×2 IMPLANT
PAD ARMBOARD 7.5X6 YLW CONV (MISCELLANEOUS) ×4 IMPLANT
PAD CAST 4YDX4 CTTN HI CHSV (CAST SUPPLIES) ×1 IMPLANT
PADDING CAST COTTON 4X4 STRL (CAST SUPPLIES) ×1
PADDING CAST COTTON 6X4 STRL (CAST SUPPLIES) ×2 IMPLANT
PEG LCS COM 3 MB STD (Knees) ×2 IMPLANT
SET HNDPC FAN SPRY TIP SCT (DISPOSABLE) ×1 IMPLANT
SLEEVE MBT PROUS ML 29MM (Knees) ×2 IMPLANT
STAPLER VISISTAT 35W (STAPLE) ×2 IMPLANT
STEM UNIVERSAL 115X12MM (Stem) ×2 IMPLANT
SUCTION FRAZIER HANDLE 10FR (MISCELLANEOUS) ×1
SUCTION TUBE FRAZIER 10FR DISP (MISCELLANEOUS) ×1 IMPLANT
SUT ETHIBOND NAB CT1 #1 30IN (SUTURE) ×6 IMPLANT
SUT VIC AB 0 CT1 27 (SUTURE) ×1
SUT VIC AB 0 CT1 27XBRD ANBCTR (SUTURE) ×1 IMPLANT
SUT VIC AB 2-0 CT1 27 (SUTURE) ×2
SUT VIC AB 2-0 CT1 TAPERPNT 27 (SUTURE) ×2 IMPLANT
SYR CONTROL 10ML LL (SYRINGE) IMPLANT
TOWEL OR 17X24 6PK STRL BLUE (TOWEL DISPOSABLE) ×2 IMPLANT
TOWEL OR 17X26 10 PK STRL BLUE (TOWEL DISPOSABLE) ×2 IMPLANT
TRAY FOLEY CATH 16FRSI W/METER (SET/KITS/TRAYS/PACK) ×2 IMPLANT
TRAY REVISION SZ 4 (Knees) ×2 IMPLANT
WATER STERILE IRR 1000ML POUR (IV SOLUTION) IMPLANT

## 2016-01-12 NOTE — Anesthesia Preprocedure Evaluation (Signed)
Anesthesia Evaluation  Patient identified by MRN, date of birth, ID band Patient awake    Reviewed: Allergy & Precautions, NPO status , Patient's Chart, lab work & pertinent test results  History of Anesthesia Complications Negative for: history of anesthetic complications  Airway Mallampati: II  TM Distance: >3 FB Neck ROM: Full    Dental  (+) Dental Advisory Given   Pulmonary neg pulmonary ROS,    breath sounds clear to auscultation       Cardiovascular hypertension, Pt. on medications + dysrhythmias Atrial Fibrillation  Rhythm:Regular     Neuro/Psych PSYCHIATRIC DISORDERS Anxiety  Neuromuscular disease    GI/Hepatic Neg liver ROS, GERD  Medicated and Controlled,  Endo/Other  Morbid obesity  Renal/GU negative Renal ROS     Musculoskeletal  (+) Arthritis ,   Abdominal   Peds  Hematology negative hematology ROS (+)   Anesthesia Other Findings   Reproductive/Obstetrics                             Anesthesia Physical Anesthesia Plan  ASA: III  Anesthesia Plan: Spinal   Post-op Pain Management:    Induction:   Airway Management Planned: Natural Airway, Nasal Cannula and Simple Face Mask  Additional Equipment: None  Intra-op Plan:   Post-operative Plan:   Informed Consent: I have reviewed the patients History and Physical, chart, labs and discussed the procedure including the risks, benefits and alternatives for the proposed anesthesia with the patient or authorized representative who has indicated his/her understanding and acceptance.   Dental advisory given  Plan Discussed with: CRNA and Surgeon  Anesthesia Plan Comments: (Normal coags, xarelto held for 3 days, risks and benefits of neuraxial discussed with patient and family, desire to proceed with spinal anesthesia )        Anesthesia Quick Evaluation

## 2016-01-12 NOTE — Transfer of Care (Signed)
Immediate Anesthesia Transfer of Care Note  Patient: Kristen Garrison  Procedure(s) Performed: Procedure(s): TOTAL RIGHT KNEE REVISION (Right)  Patient Location: PACU  Anesthesia Type:Spinal  Level of Consciousness:  sedated, patient cooperative and responds to stimulation  Airway & Oxygen Therapy:Patient Spontanous Breathing and Patient connected to face mask oxgen  Post-op Assessment:  Report given to PACU RN and Post -op Vital signs reviewed and stable  Post vital signs:  Reviewed and stable  Last Vitals: There were no vitals filed for this visit.  Complications: No apparent anesthesia complications

## 2016-01-12 NOTE — Anesthesia Procedure Notes (Signed)
Spinal Patient location during procedure: OR Staffing Anesthesiologist: Cheveyo [place] Preanesthetic Checklist Completed: patient identified, surgical consent, pre-op evaluation, timeout performed, IV checked, risks and benefits discussed and monitors and equipment checked Spinal Block Patient position: sitting Prep: site prepped and draped and DuraPrep Patient monitoring: heart rate, cardiac monitor, continuous pulse ox and blood pressure Approach: midline Location: L3-4 Injection technique: single-shot Needle Needle type: Pencan  Needle gauge: 24 G Needle length: 10 cm Assessment Sensory level: T8   

## 2016-01-12 NOTE — Brief Op Note (Signed)
01/12/2016  10:24 AM  PATIENT:  Kristen Garrison  82 y.o. female  PRE-OPERATIVE DIAGNOSIS:  LOOSENING HARDWARE RIGHT KNEE  POST-OPERATIVE DIAGNOSIS:  same  PROCEDURE:  Procedure(s): TOTAL RIGHT KNEE REVISION (Right) tibial component and poly exchange patella  SURGEON:  Surgeon(s) and Role:    * Frederico Hammananiel Caffrey, MD - Primary    * Gean BirchwoodFrank Rowan, MD - Assisting  PHYSICIAN ASSISTANT: Margart SicklesJoshua Jaimere Feutz, PA-C  ASSISTANTS:    ANESTHESIA:   local, spinal and IV sedation  EBL:  Total I/O In: 2000 [I.V.:2000] Out: 475 [Urine:400; Blood:75]  BLOOD ADMINISTERED:none  DRAINS: none   LOCAL MEDICATIONS USED:  MARCAINE     SPECIMEN:  Source of Specimen:  synoviam right knee   DISPOSITION OF SPECIMEN:  micro  COUNTS:  YES  TOURNIQUET:   Total Tourniquet Time Documented: Thigh (Right) - 15 minutes Total: Thigh (Right) - 15 minutes   DICTATION: .Other Dictation: Dictation Number unknown  PLAN OF CARE: Admit to inpatient   PATIENT DISPOSITION:  PACU - hemodynamically stable.   Delay start of Pharmacological VTE agent (>24hrs) due to surgical blood loss or risk of bleeding: yes

## 2016-01-12 NOTE — Plan of Care (Signed)
Problem: Activity: Goal: Ability to avoid complications of mobility impairment will improve Outcome: Not Progressing Notes: patient just had surgery today

## 2016-01-12 NOTE — Progress Notes (Signed)
Orthopedic Tech Progress Note Patient Details:  Kristen Garrison [birth year] [id number]  CPM Right Knee CPM Right Knee: On Right Knee Flexion (Degrees): 90 Right Knee Extension (Degrees): 0 Additional Comments: trapeze bar patient helper Viewed order from doctor's order list  [name], [name] 01/12/2016, 11:48 AM

## 2016-01-12 NOTE — Discharge Instructions (Signed)
INSTRUCTIONS AFTER JOINT REPLACEMENT  ° °o Remove items at home which could result in a fall. This includes throw rugs or furniture in walking pathways °o ICE to the affected joint every three hours while awake for 30 minutes at a time, for at least the first 3-5 days, and then as needed for pain and swelling.  Continue to use ice for pain and swelling. You may notice swelling that will progress down to the foot and ankle.  This is normal after surgery.  Elevate your leg when you are not up walking on it.   °o Continue to use the breathing machine you got in the hospital (incentive spirometer) which will help keep your temperature down.  It is common for your temperature to cycle up and down following surgery, especially at night when you are not up moving around and exerting yourself.  The breathing machine keeps your lungs expanded and your temperature down. ° ° °DIET:  As you were doing prior to hospitalization, we recommend a well-balanced diet. ° °DRESSING / WOUND CARE / SHOWERING ° °You may change your dressing 3-5 days after surgery.  Then change the dressing every day with sterile gauze.  Please use good hand washing techniques before changing the dressing.  Do not use any lotions or creams on the incision until instructed by your surgeon. ° °ACTIVITY ° °o Increase activity slowly as tolerated, but follow the weight bearing instructions below.   °o No driving for 6 weeks or until further direction given by your physician.  You cannot drive while taking narcotics.  °o No lifting or carrying greater than 10 lbs. until further directed by your surgeon. °o Avoid periods of inactivity such as sitting longer than an hour when not asleep. This helps prevent blood clots.  °o You may return to work once you are authorized by your doctor.  ° ° ° °WEIGHT BEARING  ° °Weight bearing as tolerated with assist device (walker, cane, etc) as directed, use it as long as suggested by your surgeon or therapist, typically at  least 4-6 weeks. ° ° °EXERCISES ° °Results after joint replacement surgery are often greatly improved when you follow the exercise, range of motion and muscle strengthening exercises prescribed by your doctor. Safety measures are also important to protect the joint from further injury. Any time any of these exercises cause you to have increased pain or swelling, decrease what you are doing until you are comfortable again and then slowly increase them. If you have problems or questions, call your caregiver or physical therapist for advice.  ° °Rehabilitation is important following a joint replacement. After just a few days of immobilization, the muscles of the leg can become weakened and shrink (atrophy).  These exercises are designed to build up the tone and strength of the thigh and leg muscles and to improve motion. Often times heat used for twenty to thirty minutes before working out will loosen up your tissues and help with improving the range of motion but do not use heat for the first two weeks following surgery (sometimes heat can increase post-operative swelling).  ° °These exercises can be done on a training (exercise) mat, on the floor, on a table or on a bed. Use whatever works the best and is most comfortable for you.    Use music or television while you are exercising so that the exercises are a pleasant break in your day. This will make your life better with the exercises acting as a break   in your routine that you can look forward to.   Perform all exercises about fifteen times, three times per day or as directed.  You should exercise both the operative leg and the other leg as well. ° °Exercises include: °  °• Quad Sets - Tighten up the muscle on the front of the thigh (Quad) and hold for 5-10 seconds.   °• Straight Leg Raises - With your knee straight (if you were given a brace, keep it on), lift the leg to 60 degrees, hold for 3 seconds, and slowly lower the leg.  Perform this exercise against  resistance later as your leg gets stronger.  °• Leg Slides: Lying on your back, slowly slide your foot toward your buttocks, bending your knee up off the floor (only go as far as is comfortable). Then slowly slide your foot back down until your leg is flat on the floor again.  °• Angel Wings: Lying on your back spread your legs to the side as far apart as you can without causing discomfort.  °• Hamstring Strength:  Lying on your back, push your heel against the floor with your leg straight by tightening up the muscles of your buttocks.  Repeat, but this time bend your knee to a comfortable angle, and push your heel against the floor.  You may put a pillow under the heel to make it more comfortable if necessary.  ° °A rehabilitation program following joint replacement surgery can speed recovery and prevent re-injury in the future due to weakened muscles. Contact your doctor or a physical therapist for more information on knee rehabilitation.  ° ° °CONSTIPATION ° °Constipation is defined medically as fewer than three stools per week and severe constipation as less than one stool per week.  Even if you have a regular bowel pattern at home, your normal regimen is likely to be disrupted due to multiple reasons following surgery.  Combination of anesthesia, postoperative narcotics, change in appetite and fluid intake all can affect your bowels.  ° °YOU MUST use at least one of the following options; they are listed in order of increasing strength to get the job done.  They are all available over the counter, and you may need to use some, POSSIBLY even all of these options:   ° °Drink plenty of fluids (prune juice may be helpful) and high fiber foods °Colace 100 mg by mouth twice a day  °Senokot for constipation as directed and as needed Dulcolax (bisacodyl), take with full glass of water  °Miralax (polyethylene glycol) once or twice a day as needed. ° °If you have tried all these things and are unable to have a bowel  movement in the first 3-4 days after surgery call either your surgeon or your primary doctor.   ° °If you experience loose stools or diarrhea, hold the medications until you stool forms back up.  If your symptoms do not get better within 1 week or if they get worse, check with your doctor.  If you experience "the worst abdominal pain ever" or develop nausea or vomiting, please contact the office immediately for further recommendations for treatment. ° ° °ITCHING:  If you experience itching with your medications, try taking only a single pain pill, or even half a pain pill at a time.  You can also use Benadryl over the counter for itching or also to help with sleep.  ° °TED HOSE STOCKINGS:  Use stockings on both legs until for at least 2 weeks or as   directed by physician office. They may be removed at night for sleeping.  MEDICATIONS:  See your medication summary on the After Visit Summary that nursing will review with you.  You may have some home medications which will be placed on hold until you complete the course of blood thinner medication.  It is important for you to complete the blood thinner medication as prescribed.  PRECAUTIONS:  If you experience chest pain or shortness of breath - call 911 immediately for transfer to the hospital emergency department.   If you develop a fever greater that 101 F, purulent drainage from wound, increased redness or drainage from wound, foul odor from the wound/dressing, or calf pain - CONTACT YOUR SURGEON.                                                   FOLLOW-UP APPOINTMENTS:  If you do not already have a post-op appointment, please call the office for an appointment to be seen by your surgeon.  Guidelines for how soon to be seen are listed in your After Visit Summary, but are typically between 1-4 weeks after surgery.  OTHER INSTRUCTIONS:   Knee Replacement:  Do not place pillow under knee, focus on keeping the knee straight while resting. CPM  instructions: 0-90 degrees, 2 hours in the morning, 2 hours in the afternoon, and 2 hours in the evening. Place foam block, curve side up under heel at all times except when in CPM or when walking.  DO NOT modify, tear, cut, or change the foam block in any way.  MAKE SURE YOU:   Understand these instructions.   Get help right away if you are not doing well or get worse.    Thank you for letting us be a part of your medical care team.  It is a privilege we respect greatly.  We hope these instructions will help you stay on track for a fast and full recovery!    Information on my medicine - XARELTO (Rivaroxaban)  This medication education was reviewed with me or my healthcare representative as part of my discharge preparation.  The pharmacist that spoke with me during my hospital stay was:  Gardner CandleCarney, Zaron Zwiefelhofer C, [facility]  Why was Xarelto prescribed for you? Xarelto was prescribed for you to reduce the risk of a blood clot forming that can cause a stroke if you have a medical condition called atrial fibrillation (a type of irregular heartbeat).  What do you need to know about xarelto ? Take your Xarelto ONCE DAILY at the same time every day with your evening meal. If you have difficulty swallowing the tablet whole, you may crush it and mix in applesauce just prior to taking your dose.  Take Xarelto exactly as prescribed by your doctor and DO NOT stop taking Xarelto without talking to the doctor who prescribed the medication.  Stopping without other stroke prevention medication to take the place of Xarelto may increase your risk of developing a clot that causes a stroke.  Refill your prescription before you run out.  After discharge, you should have regular check-up appointments with your healthcare provider that is prescribing your Xarelto.  In the future your dose may need to be changed if your kidney function or weight changes by a significant amount.  What do you do if you miss a dose? If  you are taking Xarelto ONCE DAILY and you miss a dose, take it as soon as you remember on the same day then continue your regularly scheduled once daily regimen the next day. Do not take two doses of Xarelto at the same time or on the same day.   Important Safety Information A possible side effect of Xarelto is bleeding. You should call your healthcare provider right away if you experience any of the following: ? Bleeding from an injury or your nose that does not stop. ? Unusual colored urine (red or dark brown) or unusual colored stools (red or black). ? Unusual bruising for unknown reasons. ? A serious fall or if you hit your head (even if there is no bleeding).  Some medicines may interact with Xarelto and might increase your risk of bleeding while on Xarelto. To help avoid this, consult your healthcare provider or pharmacist prior to using any new prescription or non-prescription medications, including herbals, vitamins, non-steroidal anti-inflammatory drugs (NSAIDs) and supplements.  This website has more information on Xarelto: [email or web address]

## 2016-01-12 NOTE — Anesthesia Postprocedure Evaluation (Signed)
Anesthesia Post Note  Patient: Kristen Garrison  Procedure(s) Performed: Procedure(s) (LRB): TOTAL RIGHT KNEE REVISION (Right)  Patient location during evaluation: PACU Anesthesia Type: Spinal Level of consciousness: awake Pain management: pain level controlled Vital Signs Assessment: post-procedure vital signs reviewed and stable Respiratory status: spontaneous breathing Cardiovascular status: stable Postop Assessment: spinal receding and no signs of nausea or vomiting Anesthetic complications: no    Last Vitals:  Filed Vitals:   01/12/16 1400 01/12/16 1423  BP: 120/63 131/62  Pulse: 80 76  Temp: 36.6 C 36.4 C  Resp: 16 17    Last Pain:  Filed Vitals:   01/12/16 1425  PainSc: Asleep                 Jacqulene Huntley

## 2016-01-13 LAB — URINALYSIS, ROUTINE W REFLEX MICROSCOPIC
BILIRUBIN URINE: NEGATIVE
Glucose, UA: NEGATIVE mg/dL
KETONES UR: NEGATIVE mg/dL
NITRITE: NEGATIVE
PH: 6.5 (ref 5.0–8.0)
Protein, ur: NEGATIVE mg/dL
Specific Gravity, Urine: 1.007 (ref 1.005–1.030)

## 2016-01-13 LAB — BASIC METABOLIC PANEL
Anion gap: 8 (ref 5–15)
BUN: 11 mg/dL (ref 6–20)
CALCIUM: 8.6 mg/dL — AB (ref 8.9–10.3)
CO2: 27 mmol/L (ref 22–32)
CREATININE: 0.9 mg/dL (ref 0.44–1.00)
Chloride: 104 mmol/L (ref 101–111)
GFR calc non Af Amer: 56 mL/min — ABNORMAL LOW (ref >60)
Glucose, Bld: 117 mg/dL — ABNORMAL HIGH (ref 65–99)
Potassium: 4.1 mmol/L (ref 3.5–5.1)
SODIUM: 139 mmol/L (ref 135–145)

## 2016-01-13 LAB — CBC
HCT: 32.2 % — ABNORMAL LOW (ref 36.0–46.0)
Hemoglobin: 9.8 g/dL — ABNORMAL LOW (ref 12.0–15.0)
MCH: 27.8 pg (ref 26.0–34.0)
MCHC: 30.4 g/dL (ref 30.0–36.0)
MCV: 91.5 fL (ref 78.0–100.0)
Platelets: 272 10*3/uL (ref 150–400)
RBC: 3.52 MIL/uL — ABNORMAL LOW (ref 3.87–5.11)
RDW: 13.9 % (ref 11.5–15.5)
WBC: 10.4 10*3/uL (ref 4.0–10.5)

## 2016-01-13 LAB — URINE MICROSCOPIC-ADD ON: Bacteria, UA: NONE SEEN

## 2016-01-13 NOTE — Plan of Care (Signed)
Physical Therapy Treatment Patient Details Name: Kristen Garrison MRN: [id number] DOB: May 14, 1927 Today's Date: 01/13/2016    History of Present Illness Patinent requested to go to the bathroom agian. Therapy to assist her back from the commode and back to bed.     PT Comments    Patient continues to require significant assist for transfers. Reviewed basic exerrcises with apitient but she was falling asleep when back in bed. Her range was measured from 5-85 degrees with pain at end range. Continue to progress ambulation as able.   Follow Up Recommendations  SNF     Equipment Recommendations  Rolling walker with 5" wheels    Recommendations for Other Services       Precautions / Restrictions Precautions Precautions: Knee Required Braces or Orthoses: Knee Immobilizer - Right Knee Immobilizer - Right: On when out of bed or walking Restrictions Weight Bearing Restrictions: Yes Other Position/Activity Restrictions: WBAT     Mobility  Bed Mobility Overal bed mobility: Needs Assistance Bed Mobility: Supine to Sit     Supine to sit: Mod assist     General bed mobility comments: Mod a to get back to bed. Patient needed mod a to get hips positioned and her right leg positioned  Transfers Overall transfer level: Needs assistance Equipment used: Rolling walker (2 wheeled) Transfers: Stand Pivot Transfers   Stand pivot transfers: Mod assist;Max assist       General transfer comment: Mod a to transfer to the commode Max a to transfer back to the bed.   Ambulation/Gait Ambulation/Gait assistance: Max assist Ambulation Distance (Feet): 2 Feet Assistive device:  (with assist of therapist ) Gait Pattern/deviations: Decreased step length - right;Decreased stance time - right;Step-to pattern     General Gait Details: amb 2'x2 with max a to the commode and back to the chair.    Stairs            Wheelchair Mobility    Modified Rankin (Stroke Patients Only)       Balance Overall balance assessment: Needs assistance Sitting-balance support: No upper extremity supported Sitting balance-Leahy Scale: Good     Standing balance support: Bilateral upper extremity supported Standing balance-Leahy Scale: Poor                      Cognition Arousal/Alertness: Lethargic Behavior During Therapy:  (Drowsy at times ) Overall Cognitive Status: Within Functional Limits for tasks assessed                      Exercises Total Joint Exercises Ankle Circles/Pumps: 20 reps Quad Sets: 10 reps    General Comments General comments (skin integrity, edema, etc.): wound covered with bandage       Pertinent Vitals/Pain Pain Assessment: 0-10 Pain Score: 4  Pain Location: right knee  Pain Intervention(s): Limited activity within patient's tolerance;Monitored during session;Repositioned;Ice applied    Home Living Family/patient expects to be discharged to:: Skilled nursing facility Living Arrangements: Spouse/significant other             Additional Comments: Lives at home with her husband. Has 3 steps to get into her house. Has both a walking shower and a tub.     Prior Function Level of Independence: Independent with assistive device(s)      Comments: Used a device at times but otherwise Independent.    PT Goals (current goals can now be found in the care plan section) Acute Rehab PT Goals Patient Stated Goal: "  less pain in my knee"  PT Goal Formulation: With patient Time For Goal Achievement: 01/27/16 Potential to Achieve Goals: Good    Frequency       PT Plan      Co-evaluation             End of Session Equipment Utilized During Treatment: Gait belt;Right knee immobilizer Activity Tolerance: Patient limited by pain       Time: 1025-1055 PT Time Calculation (min) (ACUTE ONLY): 30 min  Charges:  $Therapeutic Exercise: 8-22 mins $Therapeutic Activity: 8-22 mins                    G Codes:      Dessie Comaavid J  Sophina Mitten PT DPT  01/13/2016, 1:54 PM
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
## 2016-01-13 NOTE — Progress Notes (Signed)
   Assessment: 1 Day Post-Op  S/P Procedure(s) (LRB): TOTAL RIGHT KNEE REVISION (Right) By Dr. Madelon Lipsaffrey on 01/12/16.  Active Problems:   Failed total right knee replacement (HCC) ADDITIONAL DIAGNOSIS:  Acute Blood loss anemia - likely with dilutional component.   -Difficult pain control this A.M.  Patient requests only Tylenol. -Low grade temp this am and reported mild confusion yesterday from nursing - improved this am.  Likely atelectasis contributing.  WBC wnl, but will check UA.  Plan: Advance diet D/C foley Up with therapy - follow progress. Follow Labs  Weight Bearing: Weight Bearing as Tolerated (WBAT) Right leg Dressings: PRN VTE prophylaxis: Xarelto, SCDs, ambulation Dispo: Likely Skilled Nursing Facility.  Possibly tomorrow.  Husband thinks that she will need additional care than that which he can provide at home.  Subjective: Patient reports pain as moderate to severe.  Asks for Tylenol.  Feeling warm this morning - discussed incentive spirometry.  No solids yet.  Not OOB.  No CP, SOB.  Objective:   VITALS:   Filed Vitals:   01/12/16 2052 01/12/16 2325 01/13/16 0433 01/13/16 0913  BP: 150/58 114/66 156/59 145/60  Pulse: 84 86 108   Temp: 99 F (37.2 C) 100 F (37.8 C) 101.3 F (38.5 C)   TempSrc: Oral Oral Oral   Resp: 16 16 18    Weight:      SpO2: 100% 97% 95%     Lab Results  Component Value Date   WBC 10.4 01/13/2016   HGB 9.8* 01/13/2016   HCT 32.2* 01/13/2016   MCV 91.5 01/13/2016   PLT 272 01/13/2016   BMET    Component Value Date/Time   NA 139 01/13/2016 0601   K 4.1 01/13/2016 0601   CL 104 01/13/2016 0601   CO2 27 01/13/2016 0601   GLUCOSE 117* 01/13/2016 0601   BUN 11 01/13/2016 0601   CREATININE 0.90 01/13/2016 0601   CALCIUM 8.6* 01/13/2016 0601   GFRNONAA 56* 01/13/2016 0601   GFRAA >60 01/13/2016 0601    Physical Exam General: Uncomfortable appearing in NAD.  Husband at bedside.  Resp: No increased WOB ABD  soft Neurologically intact MSK Neurovascular intact Sensation intact distally Intact pulses distally Dorsiflexion/Plantar flexion intact Incision: dressing C/D/I   Albina BilletHenry Calvin Martensen III 01/13/2016, 9:37 AM

## 2016-01-13 NOTE — Progress Notes (Signed)
Orthopedic Tech Progress Note Patient Details:  Kristen Garrison 08-Aug-1926 [id number]  CPM Right Knee CPM Right Knee: On Right Knee Flexion (Degrees): 90 Right Knee Extension (Degrees): 0 Additional Comments: trapeze bar patient helper   [name] 01/13/2016, 6:42 PM

## 2016-01-13 NOTE — Op Note (Signed)
NAMMarland Kitchen:  Marcelle SmilingESTERMAN, Aella              ACCOUNT NO.:  [id number]  MEDICAL RECORD NO.:  [id number]  LOCATION:  [id number]                        FACILITY:  [facility]  PHYSICIAN:  Dyke BrackettW. D. Mikah Poss, M.D.    DATE OF BIRTH:  July 19, 1926  DATE OF PROCEDURE:  01/12/2016 DATE OF DISCHARGE:                              OPERATIVE REPORT   PREOPERATIVE DIAGNOSIS: 1. Impending pathologic fracture right tibia, knee replacement. 2. Patellar wear.  OPERATION:  Partial revision, right knee replacement (tibial/patella: Tibial MBT revision tray size 4, cemented MBT 29 mm metaphyseal sleeve porous, between those universal 115 x 12 mm stem; 3 peg LCS plastic patella with 15 mm tibial bearing).  SURGEON:  Dyke BrackettW. D. Hendrixx Severin, M.D.  ASSISTANT:  Dr. Turner Danielsowan  SECOND ASSISTANT:  Margart SicklesJoshua Chadwell, PA-C.  ANESTHESIA:  Spinal, femoral nerve block with local infiltration Exparel and Marcaine.  DESCRIPTION OF PROCEDURE:  Supine position.  Use of the old skin incision with a medial parapatellar approach to the knee was made. There was extreme amount of patellar over growth.  Patella button was exchanged, it did not show excessive wear.  Synovectomy was carried out. There was reaction relative to some loosening cement.  The tibial component was noted to be grossly loose.  There was a large metaphyseal cyst along the anterior medial aspect of the tibia.  Femur was tested and extremely solid with no evidence of loosening.  We exposed the tibia and removed the plastic bearing and then lifted off the tibial component revealing an extensive defect, impending fracture along the anterior medial tibia which was curetted out.  We then machined the tibia with a combination of hand reaming and power reaming to accept the above- mentioned metaphyseal sleeve and the long revision stem.  We elected to use a 150 mm stem in light of the patient's osteoporosis and advanced age.  The bone defect once we did a revision cut on the tibia with about a  2-degree negative slope for the MBT tray, was filled with a small amount of excess cement.  However, once the tibia was machined to the metaphyseal sleeve, there was a minimal defect remaining.  Trial components were inserted.  We eventually sized up to a 15 mm bearing as the patient had a 10 mm bearing inserted in the primary knee.  We then inserted the tibial component with antibiotic-impregnated cement was then assessed with some coating on the metaphyseal sleeve but mainly on the tibia.  Cement was allowed to harden with the trial bearings.  The excess cement was removed.  The patella button was replaced.  Note in the record, the tourniquet was only inflated for the period of time prior to cementing for a total of about 15-20 minutes.  The patient received topical tranexamic acid as well as Exparel infiltration on the skin with Marcaine.  Closure was affected with #1 Ethibond 2-0 Vicryl and skin clips.  Light compressive sterile dressing applied.  Taken to recovery room in stable condition.     Dyke BrackettW. D. Diron Haddon, M.D.     WDC/MEDQ  D:  01/12/2016  T:  01/13/2016  Job:  [id number]

## 2016-01-14 LAB — CBC
HEMATOCRIT: 32.1 % — AB (ref 36.0–46.0)
HEMOGLOBIN: 10 g/dL — AB (ref 12.0–15.0)
MCH: 28.2 pg (ref 26.0–34.0)
MCHC: 31.2 g/dL (ref 30.0–36.0)
MCV: 90.7 fL (ref 78.0–100.0)
Platelets: 257 10*3/uL (ref 150–400)
RBC: 3.54 MIL/uL — AB (ref 3.87–5.11)
RDW: 13.7 % (ref 11.5–15.5)
WBC: 9.6 10*3/uL (ref 4.0–10.5)
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
## 2016-01-14 NOTE — Clinical Social Work Note (Signed)
Clinical Social Work Assessment  Patient Details  Name: Kristen Garrison MRN: [id number] Date of Birth: Jul 08, 1926  Date of referral:  01/14/16               Reason for consult:  Discharge Planning                Permission sought to share information with:  Case Manager, Facility Sport and exercise psychologist, Family Supports Permission granted to share information::  Yes, Verbal Permission Granted  Name::        Agency::   (SNF)  Relationship::     Contact Information:     Housing/Transportation Living arrangements for the past 2 months:  Single Family Home Source of Information:  Patient, Medical Team Patient Interpreter Needed:  None Criminal Activity/Legal Involvement Pertinent to Current Situation/Hospitalization:  No - Comment as needed Significant Relationships:  Spouse Lives with:  Spouse Do you feel safe going back to the place where you live?  No Need for family participation in patient care:  Yes (Comment)  Care giving concerns: Pt has no care giving concerns at this time. Pt agreeable to SNF for higher level of care.    Social Worker assessment / plan: Holiday representative met with pt and husband to discuss CSW role with discharge planning. CSW explained PT recommendation for SNF. Pt and husband stated that they are familiar with SNF process and would like [facility] as first choice for SNF as they indicated that this SNF is closer to their home.    CSW will provide bed offers once available.   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 Pt is agreeable to SNF and appears happy with care she is receiving at [facility].   Patient/Family's Understanding of and Emotional Response to Diagnosis,  Current Treatment, and Prognosis: Pt and husband appear to understanding of reason for pt admission into the hospital and of pt care plan.   Emotional Assessment Appearance:  Appears stated age Attitude/Demeanor/Rapport:   (Pleasant) Affect (typically observed):  Accepting, Appropriate, Pleasant Orientation:  Oriented to Self, Oriented to Place, Oriented to  Time, Oriented to Situation Alcohol / Substance use:  Not Applicable Psych involvement (Current and /or in the community):  No (Comment)  Discharge Needs  Concerns to be addressed:  Discharge Planning Concerns Readmission within the last 30 days:  No Current discharge risk:  None Barriers to Discharge:  Continued Medical Work up   Junie Spencer, LCSW 01/14/2016, 2:47 PM
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
## 2016-01-14 NOTE — Progress Notes (Signed)
Orthopedic Tech Progress Note Patient Details:  Kristen Garrison 24-Oct-1926 [id number]  CPM Right Knee CPM Right Knee: On Right Knee Flexion (Degrees): 45 Right Knee Extension (Degrees): 0 Additional Comments: trapeze bar patient helper   [name] 01/14/2016, 3:46 PM
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
## 2016-01-14 NOTE — Clinical Social Work Placement (Signed)
   CLINICAL SOCIAL WORK PLACEMENT  NOTE  Date:  01/14/2016  Patient Details  Name: Mikisha Latricia Heft Giese MRN: [id number] Date of Birth: [birth year]  Clinical Social Work is seeking post-discharge placement for this patient at the Skilled  Nursing Facility level of care (*CSW will initial, date and re-position this form in  chart as items are completed):  Yes   Patient/family provided with [place] [facility] Clinical Social Work Department's list of facilities offering this level of care within the geographic area requested by the patient (or if unable, by the patient's family).  Yes   Patient/family informed of their freedom to choose among providers that offer the needed level of care, that participate in Medicare, Medicaid or managed care program needed by the patient, have an available bed and are willing to accept the patient.  Yes   Patient/family informed of [facility]'s ownership interest in [facility] and [facility], as well as of the fact that they are under no obligation to receive care at these facilities.  PASRR submitted to EDS on 01/14/16     PASRR number received on 01/14/16     Existing PASRR number confirmed on       FL2 transmitted to all facilities in geographic area requested by pt/family on 01/14/16     FL2 transmitted to all facilities within larger geographic area on       Patient informed that his/her managed care company has contracts with or will negotiate with certain facilities, including the following:            Patient/family informed of bed offers received.  Patient chooses bed at       Physician recommends and patient chooses bed at      Patient to be transferred to   on  .  Patient to be transferred to facility by       Patient family notified on   of transfer.  Name of family member notified:        PHYSICIAN Please prepare priority discharge summary, including medications, Please prepare prescriptions, Please sign FL2     Additional  Comment:    _______________________________________________ Terald Sleeperakiyah T Lathon Adan, LCSW 01/14/2016, 3:07 PM

## 2016-01-14 NOTE — Progress Notes (Signed)
Orthopedic Tech Progress Note Patient Details:  Kristen Garrison [birth year] [id number]  Patient ID: Kristen Garrison, female   DOB: [birth year], 82 y.o.   MRN: [id number] Applied cpm 0-45  Kristen Garrison, Kristen Garrison 01/14/2016, 5:56 AM

## 2016-01-14 NOTE — Progress Notes (Signed)
   Assessment: 2 Days Post-Op  S/P Procedure(s) (LRB): TOTAL RIGHT KNEE REVISION (Right) By Dr. Madelon Lipsaffrey on 01/12/16.  Active Problems:   Failed total right knee replacement (HCC) ADDITIONAL DIAGNOSIS:  Acute Blood loss anemia - likely with dilutional component.    -Slow progress.  Up with PT, but not walking yet.  PT recommends SNF. -Continued difficult pain control, but improving and now utilizing Norco. -H/H stable.  WBC wnl.  No bacteria in UA. -Intra-op Cx NGTD  Plan: Up with therapy - follow progress. Follow Labs  Weight Bearing: Weight Bearing as Tolerated (WBAT) Right leg Dressings: PRN VTE prophylaxis: Xarelto, SCDs, ambulation Dispo: Skilled Nursing Facility when ambulating better and pain control improved.  Subjective: Patient reports pain as moderate. Tolerating diet.  Not ambulating yet.  Urinating.  +Flatus.  No CP, SOB.  Objective:   VITALS:   Filed Vitals:   01/13/16 0913 01/13/16 1353 01/13/16 2012 01/14/16 0335  BP: 145/60 125/54 147/69 111/99  Pulse:  104 113 104  Temp:  99 F (37.2 C) 99.7 F (37.6 C) 99.2 F (37.3 C)  TempSrc:  Oral Oral Oral  Resp:  18    Weight:      SpO2:  90% 94% 95%    Lab Results  Component Value Date   WBC 9.6 01/14/2016   HGB 10.0* 01/14/2016   HCT 32.1* 01/14/2016   MCV 90.7 01/14/2016   PLT 257 01/14/2016    Physical Exam General:  NAD. Supine in bed w/ CPM running.  Husband at bedside.  Resp: No increased WOB ABD soft Neurologically intact MSK Neurovascular intact Sensation intact distally Intact pulses distally Dorsiflexion/Plantar flexion intact Incision: dressing C/D/I   Kristen Garrison 01/14/2016, 7:32 AM

## 2016-01-15 ENCOUNTER — Encounter (HOSPITAL_COMMUNITY): Payer: Self-pay | Admitting: Orthopedic Surgery

## 2016-01-15 DIAGNOSIS — I959 Hypotension, unspecified: Secondary | ICD-10-CM

## 2016-01-15 DIAGNOSIS — I480 Paroxysmal atrial fibrillation: Secondary | ICD-10-CM

## 2016-01-15 DIAGNOSIS — R7989 Other specified abnormal findings of blood chemistry: Secondary | ICD-10-CM

## 2016-01-15 DIAGNOSIS — R778 Other specified abnormalities of plasma proteins: Secondary | ICD-10-CM | POA: Insufficient documentation

## 2016-01-15 DIAGNOSIS — Z96659 Presence of unspecified artificial knee joint: Secondary | ICD-10-CM | POA: Insufficient documentation

## 2016-01-15 LAB — CBC WITH DIFFERENTIAL/PLATELET
Basophils Absolute: 0 10*3/uL (ref 0.0–0.1)
Basophils Relative: 0 %
EOS PCT: 1 %
Eosinophils Absolute: 0.1 10*3/uL (ref 0.0–0.7)
HCT: 29.9 % — ABNORMAL LOW (ref 36.0–46.0)
Hemoglobin: 8.9 g/dL — ABNORMAL LOW (ref 12.0–15.0)
LYMPHS ABS: 1.8 10*3/uL (ref 0.7–4.0)
Lymphocytes Relative: 19 %
MCH: 27.6 pg (ref 26.0–34.0)
MCHC: 29.8 g/dL — AB (ref 30.0–36.0)
MCV: 92.6 fL (ref 78.0–100.0)
MONO ABS: 0.7 10*3/uL (ref 0.1–1.0)
Monocytes Relative: 8 %
Neutro Abs: 7 10*3/uL (ref 1.7–7.7)
Neutrophils Relative %: 72 %
PLATELETS: 304 10*3/uL (ref 150–400)
RBC: 3.23 MIL/uL — AB (ref 3.87–5.11)
RDW: 13.9 % (ref 11.5–15.5)
WBC: 9.5 10*3/uL (ref 4.0–10.5)

## 2016-01-15 LAB — COMPREHENSIVE METABOLIC PANEL
ALT: 23 U/L (ref 14–54)
ANION GAP: 6 (ref 5–15)
AST: 17 U/L (ref 15–41)
Albumin: 2.5 g/dL — ABNORMAL LOW (ref 3.5–5.0)
Alkaline Phosphatase: 87 U/L (ref 38–126)
BUN: 20 mg/dL (ref 6–20)
CHLORIDE: 103 mmol/L (ref 101–111)
CO2: 27 mmol/L (ref 22–32)
Calcium: 8.2 mg/dL — ABNORMAL LOW (ref 8.9–10.3)
Creatinine, Ser: 1.43 mg/dL — ABNORMAL HIGH (ref 0.44–1.00)
GFR calc non Af Amer: 32 mL/min — ABNORMAL LOW (ref >60)
GFR, EST AFRICAN AMERICAN: 37 mL/min — AB (ref >60)
Glucose, Bld: 177 mg/dL — ABNORMAL HIGH (ref 65–99)
POTASSIUM: 3.8 mmol/L (ref 3.5–5.1)
SODIUM: 136 mmol/L (ref 135–145)
Total Bilirubin: 0.5 mg/dL (ref 0.3–1.2)
Total Protein: 5.7 g/dL — ABNORMAL LOW (ref 6.5–8.1)

## 2016-01-15 LAB — CBC
HEMATOCRIT: 27.9 % — AB (ref 36.0–46.0)
Hemoglobin: 8.6 g/dL — ABNORMAL LOW (ref 12.0–15.0)
MCH: 27.9 pg (ref 26.0–34.0)
MCHC: 30.8 g/dL (ref 30.0–36.0)
MCV: 90.6 fL (ref 78.0–100.0)
Platelets: 258 10*3/uL (ref 150–400)
RBC: 3.08 MIL/uL — ABNORMAL LOW (ref 3.87–5.11)
RDW: 14 % (ref 11.5–15.5)
WBC: 9.5 10*3/uL (ref 4.0–10.5)

## 2016-01-15 LAB — GLUCOSE, CAPILLARY: GLUCOSE-CAPILLARY: 232 mg/dL — AB (ref 65–99)

## 2016-01-15 LAB — PREPARE RBC (CROSSMATCH)

## 2016-01-15 LAB — TROPONIN I: Troponin I: 0.03 ng/mL (ref <0.03)

## 2016-01-15 MED ORDER — CIPROFLOXACIN HCL 500 MG PO TABS
250.0000 mg | ORAL_TABLET | Freq: Two times a day (BID) | ORAL | Status: DC
  Administered 2016-01-15 – 2016-01-16 (×2): 250 mg via ORAL
  Filled 2016-01-15 (×2): qty 1

## 2016-01-15 MED ORDER — SODIUM CHLORIDE 0.9 % IV BOLUS (SEPSIS)
500.0000 mL | Freq: Once | INTRAVENOUS | Status: AC
  Administered 2016-01-15: 500 mL via INTRAVENOUS

## 2016-01-15 MED ORDER — FUROSEMIDE 10 MG/ML IJ SOLN: 20.0000 mg | Freq: Once | INTRAMUSCULAR | Status: DC

## 2016-01-15 MED ORDER — CIPROFLOXACIN HCL 250 MG PO TABS: ORAL_TABLET | ORAL | Status: DC

## 2016-01-15 NOTE — Progress Notes (Signed)
Follow up - patient tol PRBC - alert on entering room - color better -states she feels better - husband at bedside - skin warm and dry - mentation at baseline per husband.  SR 84 on monitor.  BP 119/43.  RN to call as needed.

## 2016-01-15 NOTE — Clinical Social Work Note (Addendum)
Insurance authorization started for [facility].  Called patient's husband and left voicemail on mobile number. Called home number. Did not leave message.  Charlynn CourtSarah Gila Lauf, CSW [phone]  10:59 am Patient's husband in room. CSW confirmed plans for [facility] [place] [place].  Charlynn CourtSarah Kolbi Altadonna, CSW [phone]  2:29 pm Authorization #: [id number]. Patient can discharge when ready.  Charlynn CourtSarah Triton Heidrich, CSW [phone]

## 2016-01-15 NOTE — Progress Notes (Signed)
Lauren in [facility] cardiac monitoring notified that patient placed on tele box 10. History A-fib and 1st degree heart block.

## 2016-01-15 NOTE — Progress Notes (Signed)
Report called to Cain SieveSusan Bristow, RN at [facility].  Report of surgery on 7/14,pt's  history, and past surgeries, VS, alert and oriented x4. susan denies any questions.

## 2016-01-15 NOTE — Progress Notes (Signed)
CRITICAL VALUE ALERT  Critical value received:  Troponin 0.03  Date of notification:  01/15/16  Time of notification:  1756  Critical value read back:Yes.    Nurse who received alert:  Carolann LittlerKatelin Brinn Westby  MD notified (1st page):  Kirstin Shepperson-PA  Time of first page:  1800  MD notified (2nd page):  Time of second page:  Responding MD:  Steele SizerKirstin Shepperson-PA  Time MD responded:  1800

## 2016-01-15 NOTE — Care Management Important Message (Signed)
Important Message  Patient Details  Name: Loucile Latricia Heft Safran MRN: [id number] Date of Birth: [birth year]   Medicare Important Message Given:  Yes    Bernadette HoitShoffner, Amira Podolak Coleman 01/15/2016, 3:04 PM

## 2016-01-15 NOTE — Clinical Social Work Note (Signed)
CSW facilitated patient discharge including contacting patient family and facility to confirm patient discharge plans. Clinical information faxed to facility and family agreeable with plan. CSW arranged ambulance transport via [facility] to [facility]. RN to call report prior to discharge [phone] Room [phone]).  CSW will sign off for now as social work intervention is no longer needed. Please consult us again if new needs arise.  Charlynn CourtSarah Lakeshia Dohner, CSW [phone]

## 2016-01-15 NOTE — Significant Event (Signed)
Rapid Response Event Note  Overview:  Called to see patient with hypotension Time Called: 1534 Arrival Time: 1540 Event Type: Hypotension  Initial Focused Assessment:  On arrival patient supine in bed - pale - arouses easily to name - oriented to person only - skin warm and dry - oral mucosa dry - husband states this is not unusual - follows commands - denies pain or SOB - bil BS = clear - abd soft - non-tender - pulse regular - left knee dressing dry and intact- RN Konrad FelixKatelyn states it is original OR dressing - looked under dressing - no hematoma noted - soft- pulse present left foot.  Review of VS - BP has been soft today - #22 angio started left hand by Gershon CullPriscilla RN -O2 sats 90% on RA.     Interventions:  NS bolus started - placed on 2 liters nasal cannula - 12 lead EKG done - Genelle BalKirsten Shepperson PA here - orders noted.  #20 angiocath inserted left antecubital for blood transfusion.  PRBC started by Caitlyn RN and Farley Lyachel RN.  BP responding to fluids.  Stat labs drawn. BP 111/47 HR 85 -SR -placed on tele overnight.  O2 sats 100% on 2 liter nasal cannula.  Resting NAD.  L knee dressing changed by Kirstin PA.   Plan of Care (if not transferred):  BP to be monitored q 30 min x2 - RN to call if not responding to fluids.  Event Summary: Name of Physician Notified: Dr. Ferne Coeaffrey    Kirstin Shepperson PA at  (pta RRT)    at    Outcome: Stayed in room and stabalized         [place], MercedesDebra L

## 2016-01-15 NOTE — Clinical Social Work Placement (Signed)
   CLINICAL SOCIAL WORK PLACEMENT  NOTE  Date:  01/15/2016  Patient Details  Name: Kristen Garrison MRN: [id number] Date of Birth: December 29, 1926  Clinical Social Work is seeking post-discharge placement for this patient at the Skilled  Nursing Facility level of care (*CSW will initial, date and re-position this form in  chart as items are completed):  Yes   Patient/family provided with [place] Clinical Social Work Department's list of facilities offering this level of care within the geographic area requested by the patient (or if unable, by the patient's family).  Yes   Patient/family informed of their freedom to choose among providers that offer the needed level of care, that participate in Medicare, Medicaid or managed care program needed by the patient, have an available bed and are willing to accept the patient.  Yes   Patient/family informed of [facility]'s ownership interest in [facility] and [facility], as well as of the fact that they are under no obligation to receive care at these facilities.  PASRR submitted to EDS on 01/14/16     PASRR number received on 01/14/16     Existing PASRR number confirmed on       FL2 transmitted to all facilities in geographic area requested by pt/family on 01/14/16     FL2 transmitted to all facilities within larger geographic area on       Patient informed that his/her managed care company has contracts with or will negotiate with certain facilities, including the following:        Yes   Patient/family informed of bed offers received.  Patient chooses bed at [facility] [place]     Physician recommends and patient chooses bed at      Patient to be transferred to [facility] [place] on 01/15/16.  Patient to be transferred to facility by [facility]     Patient family notified on 01/15/16 of transfer.  Name of family member notified:  Fayrene FearingJames     PHYSICIAN Please prepare prescriptions, Please sign FL2     Additional  Comment:    _______________________________________________ Margarito LinerSarah C Alle Difabio, LCSW 01/15/2016, 11:01 AM

## 2016-01-15 NOTE — Progress Notes (Signed)
Orthopedic Tech Progress Note Patient Details:  Kristen Garrison May 28, 1927 [id number]  Patient ID: Kristen Garrison, female   DOB: May 28, 1927, 82 y.o.   MRN: [id number] Applied cpm 0-45  Trinna PostMartinez, Ayad Nieman J 01/15/2016, 5:47 AM
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
## 2016-01-15 NOTE — Progress Notes (Signed)
Patient ID: Kristen Garrison, female   DOB: [birth year], 82 y.o.   MRN: [id number]  Called to the floor because patient was hypotensive (70/30) and lethargic when the ambulance came to transfer her to [facility].  Transfer was cancelled.  Rapid response was called and 500 cc bolus of NS was ordered.    Patient's EKG showed left axis deviation but normal sinus rhythm.  CBC with Diff and CMET and Troponins were ordered.  Hypotension improved with fluid.  Patient is somelent but arouses easily.  She is oriented to person but not to place time or situation.  Husband said she had baseline memory issues prior to surgery with sometimes not knowing where she is or who her husband is.  She will be given two units of blood now with 20mg  of Lasix IV.  Will recheck CBC and BMET in the am  Kristen Meiner A. Gwinda PasseShepperson, PA-C Physician Assistant [facility] [phone]  01/15/2016, 4:35 PM

## 2016-01-15 NOTE — Clinical Social Work Note (Signed)
[facility] informed CSW that transport cancelled. CSW called RN. Patient dc cancelled due to low bp. SNF informed.  Charlynn CourtSarah Arnetha Silverthorne, CSW [phone]
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
## 2016-01-16 LAB — TYPE AND SCREEN
ABO/RH(D): O NEG
ANTIBODY SCREEN: NEGATIVE
UNIT DIVISION: 0
Unit division: 0

## 2016-01-16 LAB — BASIC METABOLIC PANEL
Anion gap: 9 (ref 5–15)
BUN: 18 mg/dL (ref 6–20)
CO2: 29 mmol/L (ref 22–32)
CREATININE: 0.98 mg/dL (ref 0.44–1.00)
Calcium: 8.6 mg/dL — ABNORMAL LOW (ref 8.9–10.3)
Chloride: 101 mmol/L (ref 101–111)
GFR, EST AFRICAN AMERICAN: 58 mL/min — AB (ref >60)
GFR, EST NON AFRICAN AMERICAN: 50 mL/min — AB (ref >60)
Glucose, Bld: 98 mg/dL (ref 65–99)
POTASSIUM: 4 mmol/L (ref 3.5–5.1)
SODIUM: 139 mmol/L (ref 135–145)

## 2016-01-16 LAB — CBC
HCT: 35.9 % — ABNORMAL LOW (ref 36.0–46.0)
Hemoglobin: 11.5 g/dL — ABNORMAL LOW (ref 12.0–15.0)
MCH: 28.1 pg (ref 26.0–34.0)
MCHC: 32 g/dL (ref 30.0–36.0)
MCV: 87.8 fL (ref 78.0–100.0)
PLATELETS: 301 10*3/uL (ref 150–400)
RBC: 4.09 MIL/uL (ref 3.87–5.11)
RDW: 15 % (ref 11.5–15.5)
WBC: 10.8 10*3/uL — ABNORMAL HIGH (ref 4.0–10.5)

## 2016-01-16 LAB — TROPONIN I

## 2016-01-16 MED ORDER — ACETAMINOPHEN 325 MG PO TABS: 650.0000 mg | ORAL_TABLET | ORAL | Status: DC | PRN

## 2016-01-16 NOTE — Clinical Social Work Note (Signed)
CSW facilitated patient discharge including contacting patient family and facility to confirm patient discharge plans. Clinical information faxed to facility and family agreeable with plan. CSW arranged ambulance transport via [facility] to [facility] [place]. RN to call report prior to discharge ([phone] Room [phone]).  CSW will sign off for now as social work intervention is no longer needed. Please consult us again if new needs arise.  Chris Narasimhan, CSW [phone]   

## 2016-01-16 NOTE — Progress Notes (Signed)
Pt discharge education and instructions completed with pt. Pt discharge to [facility] with [facility] to transport her to disposition. [facility] Pt transported off unit via stretcher with belongings to the side. Pt spouse to take with him pt other remaining belongings. Pt IV and telemetry removed; pt knee incision dsg remains intact with no active bleeding or oozing noted. Dionne BucyP. Amo Tenea Sens RN

## 2016-01-16 NOTE — Clinical Social Work Placement (Signed)
   CLINICAL SOCIAL WORK PLACEMENT  NOTE  Date:  01/16/2016  Patient Details  Name: Kristen Garrison MRN: [id number] Date of Birth: 1927/04/20  Clinical Social Work is seeking post-discharge placement for this patient at the Skilled  Nursing Facility level of care (*CSW will initial, date and re-position this form in  chart as items are completed):  Yes   Patient/family provided with [facility] Clinical Social Work Department's list of facilities offering this level of care within the geographic area requested by the patient (or if unable, by the patient's family).  Yes   Patient/family informed of their freedom to choose among providers that offer the needed level of care, that participate in Medicare, Medicaid or managed care program needed by the patient, have an available bed and are willing to accept the patient.  Yes   Patient/family informed of [facility]'s ownership interest [facility] and [facility], as well as of the fact that they are under no obligation to receive care at these facilities.  PASRR submitted to EDS on 01/14/16     PASRR number received on 01/14/16     Existing PASRR number confirmed on       FL2 transmitted to all facilities in geographic area requested by pt/family on 01/14/16     FL2 transmitted to all facilities within larger geographic area on       Patient informed that his/her managed care company has contracts with or will negotiate with certain facilities, including the following:        Yes   Patient/family informed of bed offers received.  Patient chooses bed at [facility] [place]     Physician recommends and patient chooses bed at      Patient to be transferred to [facility] [place] on 01/16/16.  Patient to be transferred to facility by [facility]     Patient family notified on 01/16/16 of transfer.  Name of family member notified:  Kristen FearingJames     PHYSICIAN       Additional Comment:     _______________________________________________ Margarito LinerSarah C Vergie Zahm, LCSW 01/16/2016, 11:07 AM

## 2016-01-16 NOTE — Discharge Summary (Signed)
01/16/2016 ADDENDUM:  This patient was scheduled to discharge to [facility] skilled nursing facility yesterday afternoon.  When transport arrived she was hypotensive (70/30) and lethargic.  Discharge was cancelled.  Labs revealed some new renal insuffiency and a slightly elevated troponin (0.03).  EKG showed normal sinus rhythm with left axis deviation.  Hemoglobin was down to 8.  With these symptoms in the setting of a recent major surgery, she was given 2 units of packed RBCs to treat her symptomatic acute post operative blood loss anemia.  Her second troponin was normal yesterday.  Cardiology saw her and did not recommend further work up.  They suggested limiting her pain medication as this may be a cause of her hypotension.  This am she is sitting up in bed alert and awake feeding herself breakfast.  She is oriented to person but not to place or situation.  This is baseline for her according to her husband.  She still is 2+ assist to get out of bed to the chair or the commode.  She is medically stable for transfer today to [facility] for acute short term rehab.  She will need max encouragement with lots of PT and OT to help with her mobilization.  This past Friday, prior to surgery she was able to get her self in and out of bed and shower without assistance.  Our goal is for her to return to the level of function or better.  Othelia Riederer A. Gwinda PasseShepperson, PA-C Physician Assistant [facility] [phone]  01/16/2016, 8:07 AM

## 2016-01-17 ENCOUNTER — Ambulatory Visit (INDEPENDENT_AMBULATORY_CARE_PROVIDER_SITE_OTHER): Payer: PPO | Admitting: *Deleted

## 2016-01-17 DIAGNOSIS — R55 Syncope and collapse: Secondary | ICD-10-CM

## 2016-01-17 LAB — AEROBIC/ANAEROBIC CULTURE (SURGICAL/DEEP WOUND)

## 2016-01-17 LAB — AEROBIC/ANAEROBIC CULTURE W GRAM STAIN (SURGICAL/DEEP WOUND): Culture: NO GROWTH

## 2016-01-19 NOTE — Progress Notes (Signed)
Carelink Summary Report / Loop Recorder 

## 2016-01-26 ENCOUNTER — Telehealth: Payer: Self-pay | Admitting: Cardiology

## 2016-01-26 NOTE — Telephone Encounter (Signed)
LMOVM requesting that pt send manual transmission b/c home monitor has not updated in at least 14 days.    

## 2016-01-31 ENCOUNTER — Encounter: Payer: Self-pay | Admitting: Cardiology

## 2016-02-15 ENCOUNTER — Encounter: Payer: Self-pay | Admitting: Cardiology

## 2016-02-15 NOTE — Progress Notes (Signed)
Certified Letter  

## 2016-02-16 ENCOUNTER — Ambulatory Visit (INDEPENDENT_AMBULATORY_CARE_PROVIDER_SITE_OTHER): Payer: PPO | Admitting: *Deleted

## 2016-02-16 DIAGNOSIS — R55 Syncope and collapse: Secondary | ICD-10-CM | POA: Diagnosis not present

## 2016-02-16 LAB — CUP PACEART REMOTE DEVICE CHECK: MDC IDC SESS DTM: 20170720013903

## 2016-02-19 NOTE — Progress Notes (Signed)
Carelink Summary Report / Loop Report 

## 2016-02-26 ENCOUNTER — Telehealth: Payer: Self-pay | Admitting: Interventional Cardiology

## 2016-02-26 NOTE — Telephone Encounter (Signed)
Returned pt call. Pt rqst that I speak with her husband. Pt husband called to let us know that the pt is in the donut hole and the out of pocket cost for her Xarelto is $200 a month. They were not able to pick up this months supply. Pt has about a 2 week supply on hand. They were told by their insurance company to contact the prescribing physician so that samples could be supplied through the end of the year. Adv pt that we are not able to rely on samples for that length of time. I will check to see if we have an samples once [facility] on 8/30, pt is willing to apply for patient assistance, adv pt that I will provide them form to complete and return to our office. Adv that I will call them on 8/30 to let them know if we have any samples. Pt and her husband voiced appreciation for the call back.

## 2016-02-26 NOTE — Telephone Encounter (Signed)
F/U  Mr. Kristen Garrison is returning a call from [place]

## 2016-02-26 NOTE — Telephone Encounter (Signed)
New message     Pt would like nurse to call him. Pt would not give any details. Please call.

## 2016-02-28 NOTE — Telephone Encounter (Signed)
Pt husband aware samples of Xarelto 15mg  daily will be left at the front desk along with a patient assistance form. Adv pt husband to complete form, attach the financial documentation requested and return it to our office asap.pt husband verbalized understanding and voiced appreciation for the assistance.

## 2016-03-13 LAB — CUP PACEART REMOTE DEVICE CHECK: Date Time Interrogation Session: 20170819020735

## 2016-03-18 ENCOUNTER — Ambulatory Visit (INDEPENDENT_AMBULATORY_CARE_PROVIDER_SITE_OTHER): Payer: PPO | Admitting: *Deleted

## 2016-03-18 DIAGNOSIS — R55 Syncope and collapse: Secondary | ICD-10-CM | POA: Diagnosis not present

## 2016-03-18 NOTE — Progress Notes (Signed)
Carelink Summary Report / Loop Recorder 

## 2016-03-19 ENCOUNTER — Encounter: Payer: PPO | Admitting: Internal Medicine

## 2016-03-29 ENCOUNTER — Ambulatory Visit
Admission: RE | Admit: 2016-03-29 | Discharge: 2016-03-29 | Disposition: A | Discharge status: Home / Self Care | Payer: PPO | Source: Ambulatory Visit | Attending: Internal Medicine | Admitting: Internal Medicine

## 2016-03-29 ENCOUNTER — Other Ambulatory Visit: Payer: Self-pay | Admitting: Internal Medicine

## 2016-03-29 DIAGNOSIS — R05 Cough: Secondary | ICD-10-CM

## 2016-03-29 DIAGNOSIS — R059 Cough, unspecified: Secondary | ICD-10-CM

## 2016-04-12 LAB — CUP PACEART REMOTE DEVICE CHECK: Date Time Interrogation Session: 20170918020832

## 2016-04-12 NOTE — Progress Notes (Signed)
Carelink summary report received. Battery status OK. Normal device function. No new symptom episodes, brady, or pause episodes. 1 tachy- ECG appears atrial in origin. 4 AF 0.5%, known hx. of PAF +Xarelto. Monthly summary reports and ROV/PRN

## 2016-04-16 ENCOUNTER — Ambulatory Visit (INDEPENDENT_AMBULATORY_CARE_PROVIDER_SITE_OTHER): Payer: PPO | Admitting: *Deleted

## 2016-04-16 DIAGNOSIS — R55 Syncope and collapse: Secondary | ICD-10-CM

## 2016-04-17 NOTE — Progress Notes (Signed)
Carelink Summary Report / Loop Recorder 

## 2016-05-10 ENCOUNTER — Other Ambulatory Visit: Payer: Self-pay | Admitting: Interventional Cardiology

## 2016-05-16 ENCOUNTER — Ambulatory Visit (INDEPENDENT_AMBULATORY_CARE_PROVIDER_SITE_OTHER): Payer: PPO | Admitting: *Deleted

## 2016-05-16 DIAGNOSIS — R55 Syncope and collapse: Secondary | ICD-10-CM

## 2016-05-17 NOTE — Progress Notes (Signed)
Carelink Summary Report / Loop Recorder 

## 2016-05-18 LAB — CUP PACEART REMOTE DEVICE CHECK
Date Time Interrogation Session: 20171018030710
Implantable Pulse Generator Implant Date: 20150429

## 2016-05-18 NOTE — Progress Notes (Signed)
Carelink summary report received. Battery status OK. Normal device function. No new symptom episodes, brady, or pause episodes. 2 tachy episodes, AF.  3 AF episodes. Overall burden very low. +Xarelto. Monthly summary reports and ROV/PRN

## 2016-06-02 ENCOUNTER — Encounter (HOSPITAL_COMMUNITY): Payer: Self-pay

## 2016-06-02 ENCOUNTER — Emergency Department (HOSPITAL_COMMUNITY)
Admission: EM | Admit: 2016-06-02 | Discharge: 2016-06-02 | Disposition: A | Discharge status: Home / Self Care | Payer: PPO | Attending: Emergency Medicine | Admitting: Emergency Medicine

## 2016-06-02 ENCOUNTER — Emergency Department (HOSPITAL_COMMUNITY): Payer: PPO

## 2016-06-02 DIAGNOSIS — S0101XA Laceration without foreign body of scalp, initial encounter: Secondary | ICD-10-CM

## 2016-06-02 DIAGNOSIS — Y929 Unspecified place or not applicable: Secondary | ICD-10-CM | POA: Diagnosis not present

## 2016-06-02 DIAGNOSIS — W07XXXA Fall from chair, initial encounter: Secondary | ICD-10-CM | POA: Diagnosis not present

## 2016-06-02 DIAGNOSIS — S0990XA Unspecified injury of head, initial encounter: Secondary | ICD-10-CM | POA: Diagnosis not present

## 2016-06-02 DIAGNOSIS — I10 Essential (primary) hypertension: Secondary | ICD-10-CM | POA: Insufficient documentation

## 2016-06-02 DIAGNOSIS — Y939 Activity, unspecified: Secondary | ICD-10-CM | POA: Diagnosis not present

## 2016-06-02 DIAGNOSIS — Y999 Unspecified external cause status: Secondary | ICD-10-CM | POA: Diagnosis not present

## 2016-06-02 DIAGNOSIS — Z96651 Presence of right artificial knee joint: Secondary | ICD-10-CM | POA: Insufficient documentation

## 2016-06-02 DIAGNOSIS — Z7982 Long term (current) use of aspirin: Secondary | ICD-10-CM | POA: Insufficient documentation

## 2016-06-02 DIAGNOSIS — S0192XA Laceration with foreign body of unspecified part of head, initial encounter: Secondary | ICD-10-CM | POA: Insufficient documentation

## 2016-06-02 NOTE — Discharge Instructions (Signed)
You came to the emergency department because you fell and hit your head. We did a CT scan of your head to make sure you weren't bleeding into your brain. The radiologist read your CT as the following- ventricular prominence is out of proportion to sulcal prominence, which can be seen with normal pressure hydrocephalus. We think this may be contributing to your falls. Please talk to your primary care doctor about possible referral to a Neurologist.

## 2016-06-02 NOTE — ED Notes (Signed)
Patient Alert and oriented X4. Stable and ambulatory. Patient verbalized understanding of the discharge instructions.  Patient belongings were taken by the patient.  

## 2016-06-02 NOTE — ED Triage Notes (Signed)
To room via EMS.  PTA pt got up from chair and fell. Lac to back of head bleeding controlled. Pt reports did not have LOC, not dizzy, or any other symptoms. Pt has h/o falls, last fall 2 months ago.  A&Ox4.
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
## 2016-06-14 ENCOUNTER — Ambulatory Visit (INDEPENDENT_AMBULATORY_CARE_PROVIDER_SITE_OTHER): Payer: PPO | Admitting: *Deleted

## 2016-06-14 DIAGNOSIS — R55 Syncope and collapse: Secondary | ICD-10-CM

## 2016-06-17 ENCOUNTER — Telehealth: Payer: Self-pay

## 2016-06-17 NOTE — Progress Notes (Signed)
Carelink Summary Report / Loop Recorder 

## 2016-06-17 NOTE — Telephone Encounter (Signed)
New patient coordination team asked me to work in patient for new problem. An opening became available on Wednesday. I spoke to the patient and she was able to take it.

## 2016-06-19 ENCOUNTER — Ambulatory Visit (INDEPENDENT_AMBULATORY_CARE_PROVIDER_SITE_OTHER): Payer: PPO | Admitting: Neurology

## 2016-06-19 ENCOUNTER — Encounter: Payer: Self-pay | Admitting: Neurology

## 2016-06-19 VITALS — BP 118/58 | HR 94 | Resp 20 | Ht 64.0 in | Wt 177.0 lb

## 2016-06-19 DIAGNOSIS — G912 (Idiopathic) normal pressure hydrocephalus: Secondary | ICD-10-CM

## 2016-06-19 NOTE — Patient Instructions (Signed)
Normal-Pressure Hydrocephalus Normal-pressure hydrocephalus (NPH) is a buildup of fluid (cerebrospinal fluid [CSF]) inside the brain. CSF is normal within the brain, but too much fluid can affect your brain's function. NPH commonly occurs in people older than 80 years of age.  CAUSES  The cause of NPH is not always known. It can be caused by anything that blocks the flow of CSF.  SIGNS AND SYMPTOMS  Since many symptoms of NPH are also associated with conditions sometimes seen in older people, taking care to note changes in behavior and mental function is important. Symptoms of NPH may include:   Difficulty walking, such as:  Problems when beginning to walk.  Feet being "frozen" to the floor.  Shuffling feet when walking.  Unsteadiness.  Problems with bowel and bladder control.  Memory problems such as forgetfulness, lack of concentration, dull mood, or confusion. DIAGNOSIS   A medical history and physical exam will be done. A physical exam can reveal walking (gait) changes. Reflexes may be increased in the lower legs.  Tests can include:  Lumbar puncture (spinal tap).  CT scan of your head.  MRI scan of your head. TREATMENT  NPH is treated by having surgery to place a ventriculoperitoneal shunt in the brain. The ventriculoperitoneal shunt drains the excess CSF.  This information is not intended to replace advice given to you by your health care provider. Make sure you discuss any questions you have with your health care provider. Document Released: 11/01/2013 Document Reviewed: 11/01/2013 Elsevier Interactive Patient Education  2017 [employer]
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
## 2016-06-27 ENCOUNTER — Telehealth: Payer: Self-pay | Admitting: Cardiology

## 2016-06-27 LAB — CUP PACEART REMOTE DEVICE CHECK
Implantable Pulse Generator Implant Date: 20150429
MDC IDC SESS DTM: 20171117040906

## 2016-06-27 NOTE — Telephone Encounter (Signed)
LMOVM requesting that pt send manual transmission b/c home monitor has not updated in at least 14 days.    

## 2016-07-04 ENCOUNTER — Telehealth: Payer: Self-pay | Admitting: Neurology

## 2016-07-04 ENCOUNTER — Other Ambulatory Visit: Payer: Self-pay | Admitting: Neurology

## 2016-07-04 DIAGNOSIS — G919 Hydrocephalus, unspecified: Secondary | ICD-10-CM

## 2016-07-04 NOTE — Telephone Encounter (Signed)
Patient is scheduled for Lumbar Puncture and PT before and after.  [facility] [phone].  check in at 9:00 am 07/10/2016.  NPO 12:00 midnight. Patient has to have some one with her.  I have called and spoke to patient with all details. Patient understood.   Baxter HireKristen I told her you would call her with a follow up apt with Dr. Vickey Hugerohmeier. Thanks Annabelle Harmanana.

## 2016-07-05 ENCOUNTER — Encounter: Payer: Self-pay | Admitting: Cardiology

## 2016-07-08 ENCOUNTER — Ambulatory Visit: Payer: PPO | Admitting: Interventional Cardiology

## 2016-07-08 ENCOUNTER — Other Ambulatory Visit: Payer: Self-pay | Admitting: Radiology

## 2016-07-08 NOTE — Telephone Encounter (Signed)
I called pt to schedule her for a follow up appt. Pt says that she is going to cancel the LP on 07/10/16 because she does not feel well. Pt does not know which number to call and cancel this appt. I asked pt to call me back when she reschedules her LP. I will ask Annabelle HarmanDana to call pt and let her know the numbers that she needs to call to cancel and r/s the Pt the LP.

## 2016-07-08 NOTE — Telephone Encounter (Signed)
I have called [facility] at [phone] for Lumbar puncture . Physical Therapy before and after acute Rehab [phone].   Patient is aware apt of rescheduling patient is sick. rescheduled for 07/16/2016. Arrive at 9:00 am NPO. Patient understood. Baxter HireKristen will you schedule her follow up with Dr. Vickey Hugerohmeier. Thanks Annabelle Harmanana.

## 2016-07-10 ENCOUNTER — Encounter: Payer: Self-pay | Admitting: Interventional Cardiology

## 2016-07-10 ENCOUNTER — Ambulatory Visit (HOSPITAL_COMMUNITY)
Admission: RE | Admit: 2016-07-10 | Discharge: 2016-07-10 | Disposition: A | Discharge status: Home / Self Care | Payer: PPO | Source: Ambulatory Visit | Attending: Neurology | Admitting: Neurology

## 2016-07-10 ENCOUNTER — Ambulatory Visit (HOSPITAL_COMMUNITY): Admission: RE | Admit: 2016-07-10 | Payer: PPO | Source: Ambulatory Visit

## 2016-07-12 ENCOUNTER — Encounter: Payer: Self-pay | Admitting: Cardiology

## 2016-07-15 ENCOUNTER — Telehealth: Payer: Self-pay | Admitting: Neurology

## 2016-07-15 ENCOUNTER — Encounter: Payer: Self-pay | Admitting: *Deleted

## 2016-07-15 NOTE — Telephone Encounter (Signed)
I called pt to offer her a follow up appt on 07/17/16 at 2:00pm. No answer, left a message at home and cell number asking her to call me back.

## 2016-07-15 NOTE — Telephone Encounter (Signed)
I called Kristen Garrison. I offered her a follow up appt with Dr. Vickey Hugerohmeier on 07/17/16 at 3:30 pm and Kristen Garrison accepted. I reminded Kristen Garrison of [facility] at 9:00am with a NPO status. Kristen Garrison verbalized understanding of all the above information.

## 2016-07-15 NOTE — Telephone Encounter (Signed)
-----   Message from Damita LackAlicia J Bethea sent at 07/08/2016  9:51 AM EST ----- Regarding: dg fluoro Can you e sign order please.Thanks Helmut MusterAlicia

## 2016-07-16 ENCOUNTER — Ambulatory Visit (HOSPITAL_COMMUNITY)
Admission: RE | Admit: 2016-07-16 | Discharge: 2016-07-16 | Disposition: A | Discharge status: Home / Self Care | Payer: Medicare HMO | Source: Ambulatory Visit | Attending: Neurology | Admitting: Neurology

## 2016-07-16 ENCOUNTER — Ambulatory Visit: Payer: Self-pay | Admitting: Neurology

## 2016-07-16 ENCOUNTER — Encounter (HOSPITAL_COMMUNITY): Payer: Self-pay

## 2016-07-16 DIAGNOSIS — G912 (Idiopathic) normal pressure hydrocephalus: Secondary | ICD-10-CM | POA: Diagnosis not present

## 2016-07-16 DIAGNOSIS — G919 Hydrocephalus, unspecified: Secondary | ICD-10-CM | POA: Diagnosis not present

## 2016-07-16 MED ORDER — LIDOCAINE HCL 1 % IJ SOLN
INTRAMUSCULAR | Status: AC
  Filled 2016-07-16: qty 10

## 2016-07-16 MED ORDER — LIDOCAINE HCL (PF) 1 % IJ SOLN
10.0000 mL | Freq: Once | INTRAMUSCULAR | Status: AC
  Administered 2016-07-16: 5 mL via INTRADERMAL

## 2016-07-16 NOTE — Procedures (Signed)
Informed consent was obtained from the patient prior to the procedure, including potential complications of headache, allergy, and pain. With the patient prone, the lower back was prepped with Betadine. 1% Lidocaine was used for local anesthesia. Lumbar puncture was performed at the [L2-L3] level (after an initial attempt at L3-L4) using a [22] gauge needle with return of [clear] CSF with an normal opening pressure. [Twenty-one] ml of CSF were obtained. The CSF flowed very slowly and collection halted with cessation of flow.  The patient tolerated the procedure well and there were no apparent complications.  IMPRESSION: [Successful therapeutic lumbar puncture with 21 mL of CSF remain.]

## 2016-07-16 NOTE — Progress Notes (Signed)
Assumed care of pt from Debbie Hedge, RN.  Assessment documented. 

## 2016-07-16 NOTE — Discharge Instructions (Signed)
Lumbar Puncture, Care After °Refer to this sheet in the next few weeks. These instructions provide you with information on caring for yourself after your procedure. Your health care provider may also give you more specific instructions. Your treatment has been planned according to current medical practices, but problems sometimes occur. Call your health care provider if you have any problems or questions after your procedure. °What can I expect after the procedure? °After your procedure, it is typical to have the following sensations: °· Mild discomfort or pain at the insertion site. °· Mild headache that is relieved with pain medicines. ° °Follow these instructions at home: ° °· Avoid lifting anything heavier than 10 lb (4.5 kg) for at least 12 hours after the procedure. °· Drink enough fluids to keep your urine clear or pale yellow. °Contact a health care provider if: °· You have fever or chills. °· You have nausea or vomiting. °· You have a headache that lasts for more than 2 days. °Get help right away if: °· You have any numbness or tingling in your legs. °· You are unable to control your bowel or bladder. °· You have bleeding or swelling in your back at the insertion site. °· You are dizzy or faint. °This information is not intended to replace advice given to you by your health care provider. Make sure you discuss any questions you have with your health care provider. °Document Released: 06/22/2013 Document Revised: 11/23/2015 Document Reviewed: 02/23/2013 °Elsevier Interactive Patient Education © 2017 [employer] ° °

## 2016-07-17 ENCOUNTER — Ambulatory Visit: Payer: Self-pay | Admitting: Neurology

## 2016-07-19 ENCOUNTER — Other Ambulatory Visit: Payer: Self-pay | Admitting: Neurology

## 2016-07-19 ENCOUNTER — Telehealth: Payer: Self-pay

## 2016-07-19 NOTE — Telephone Encounter (Signed)
There should be box to sign clinic orders in your inbasket.

## 2016-07-19 NOTE — Telephone Encounter (Signed)
I called pt. I offered her an appt for 07/23/2016 at 11:00am, arrive at 10:45am. Pt verbalized understanding of new appt date and time.

## 2016-07-19 NOTE — Telephone Encounter (Signed)
I called pt. I advised her that her appt will need to be rescheduled due to the weather. Pt verbalized understanding.

## 2016-07-22 ENCOUNTER — Encounter: Payer: Self-pay | Admitting: Interventional Cardiology

## 2016-07-23 ENCOUNTER — Encounter: Payer: Self-pay | Admitting: Neurology

## 2016-07-23 ENCOUNTER — Ambulatory Visit (INDEPENDENT_AMBULATORY_CARE_PROVIDER_SITE_OTHER): Payer: Medicare HMO | Admitting: Neurology

## 2016-07-23 VITALS — BP 136/73 | HR 107 | Resp 20 | Ht 64.0 in | Wt 178.0 lb

## 2016-07-23 DIAGNOSIS — M6010 Interstitial myositis of unspecified site: Secondary | ICD-10-CM

## 2016-07-23 DIAGNOSIS — M791 Myalgia, unspecified site: Secondary | ICD-10-CM

## 2016-07-23 DIAGNOSIS — M6259 Muscle wasting and atrophy, not elsewhere classified, multiple sites: Secondary | ICD-10-CM | POA: Diagnosis not present

## 2016-07-23 NOTE — Addendum Note (Signed)
Addended by: Melvyn NovasHMEIER, Timaya Bojarski on: 07/23/2016 11:34 AM   Modules accepted: Orders
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
## 2016-07-23 NOTE — Patient Instructions (Signed)
Muscle Pain, Adult Muscle pain (myalgia) may be mild or severe. In most cases, the pain lasts only a short time and it goes away without treatment. It is normal to feel some muscle pain after starting a workout program. Muscles that have not been used often will be sore at first. Muscle pain may also be caused by many other things, including:  Overuse or muscle strain, especially if you are not in shape. This is the most common cause of muscle pain.  Injury.  Bruises.  Viruses, such as the flu.  Infectious diseases.  A chronic condition that causes muscle tenderness, fatigue, and headache (fibromyalgia).  A condition, such as lupus, in which the body's disease-fighting system attacks other organs in the body (autoimmune or rheumatologic diseases).  Certain drugs, including ACE inhibitors and statins. To diagnose the cause of your muscle pain, your health care provider will do a physical exam and ask questions about the pain and when it began. If you have not had muscle pain for very long, your health care provider may want to wait before doing much testing. If your muscle pain has lasted a long time, your health care provider may want to run tests right away. In some cases, this may include tests to rule out certain conditions or illnesses. Treatment for muscle pain depends on the cause. Home care is often enough to relieve muscle pain. Your health care provider may also prescribe anti-inflammatory medicine. Follow these instructions at home: Activity   If overuse is causing your muscle pain:  Slow down your activities until the pain goes away.  Do regular, gentle exercises if you are not usually active.  Warm up before exercising. Stretch before and after exercising. This can help lower the risk of muscle pain.  Do not continue working out if the pain is very bad. Bad pain could mean that you have injured a muscle. Managing pain and discomfort    If directed, apply ice to the  sore muscle:  Put ice in a plastic bag.  Place a towel between your skin and the bag.  Leave the ice on for 20 minutes, 2-3 times a day.  You may also alternate between applying ice and applying heat as told by your health care provider. To apply heat, use the heat source that your health care provider recommends, such as a moist heat pack or a heating pad.  Place a towel between your skin and the heat source.  Leave the heat on for 20-30 minutes.  Remove the heat if your skin turns bright red. This is especially important if you are unable to feel pain, heat, or cold. You may have a greater risk of getting burned. Medicines   Take over-the-counter and prescription medicines only as told by your health care provider.  Do not drive or use heavy machinery while taking prescription pain medicine. Contact a health care provider if:  Your muscle pain gets worse and medicines do not help.  You have muscle pain that lasts longer than 3 days.  You have a rash or fever along with muscle pain.  You have muscle pain after a tick bite.  You have muscle pain while working out, even though you are in good physical condition.  You have redness, soreness, or swelling along with muscle pain.  You have muscle pain after starting a new medicine or changing the dose of a medicine. Get help right away if:  You have trouble breathing.  You have trouble swallowing.    You have muscle pain along with a stiff neck, fever, and vomiting.  You have severe muscle weakness or cannot move part of your body. This information is not intended to replace advice given to you by your health care provider. Make sure you discuss any questions you have with your health care provider. Document Released: 05/09/2006 Document Revised: 01/05/2016 Document Reviewed: 11/07/2015 Elsevier Interactive Patient Education  2017 [employer]  

## 2016-07-24 LAB — SEDIMENTATION RATE: Sed Rate: 20 mm/hr (ref 0–40)

## 2016-07-24 LAB — CK TOTAL AND CKMB (NOT AT ARMC)
CK MB INDEX: 3 ng/mL (ref 0.0–5.3)
Total CK: 80 U/L (ref 24–173)

## 2016-07-24 LAB — C-REACTIVE PROTEIN: CRP: 3.7 mg/L (ref 0.0–4.9)

## 2016-07-25 ENCOUNTER — Encounter: Payer: Self-pay | Admitting: Cardiology

## 2016-07-25 DIAGNOSIS — T84498D Other mechanical complication of other internal orthopedic devices, implants and grafts, subsequent encounter: Secondary | ICD-10-CM | POA: Diagnosis not present

## 2016-07-29 ENCOUNTER — Ambulatory Visit: Payer: Medicare HMO | Attending: Neurology | Admitting: Physical Therapy

## 2016-07-29 ENCOUNTER — Telehealth: Payer: Self-pay

## 2016-07-29 NOTE — Telephone Encounter (Signed)
-----   Message from Melvyn Novasarmen Dohmeier, MD sent at 07/26/2016 11:10 AM EST ----- Normal muscle enzyme level- no indication of myositis.

## 2016-07-29 NOTE — Telephone Encounter (Signed)
I called pt to discuss labs. No answer, left a message asking her to call me back. 

## 2016-07-30 NOTE — Telephone Encounter (Signed)
I called pt. I advised her that her labs were normal. Pt verbalized understanding of results. Pt had no questions at this time but was encouraged to call back if questions arise.

## 2016-07-31 ENCOUNTER — Ambulatory Visit: Payer: Medicare HMO | Admitting: Cardiology
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
## 2016-08-04 LAB — CUP PACEART REMOTE DEVICE CHECK
Implantable Pulse Generator Implant Date: 20150429
MDC IDC SESS DTM: 20171217050714

## 2016-08-04 NOTE — Progress Notes (Signed)
Carelink summary report received. Battery status OK. Normal device function. No new symptom episodes, brady, or pause episodes. No new AF episodes. 1 tachy- V 207 BPM, narrow complex. No symptoms reported per EPIC. To SK for review. Monthly summary reports and ROV/PRN

## 2016-08-08 DIAGNOSIS — R69 Illness, unspecified: Secondary | ICD-10-CM | POA: Diagnosis not present

## 2016-08-09 ENCOUNTER — Encounter: Payer: Self-pay | Admitting: Cardiology

## 2016-08-12 DIAGNOSIS — G459 Transient cerebral ischemic attack, unspecified: Secondary | ICD-10-CM | POA: Diagnosis not present

## 2016-08-12 DIAGNOSIS — I480 Paroxysmal atrial fibrillation: Secondary | ICD-10-CM | POA: Diagnosis not present

## 2016-08-14 ENCOUNTER — Ambulatory Visit (INDEPENDENT_AMBULATORY_CARE_PROVIDER_SITE_OTHER): Payer: Medicare HMO | Admitting: Neurology

## 2016-08-14 ENCOUNTER — Encounter: Payer: Self-pay | Admitting: Neurology

## 2016-08-14 VITALS — BP 112/56 | HR 78 | Resp 18 | Ht 64.0 in | Wt 176.0 lb

## 2016-08-14 DIAGNOSIS — M4807 Spinal stenosis, lumbosacral region: Secondary | ICD-10-CM | POA: Diagnosis not present

## 2016-08-14 DIAGNOSIS — R2681 Unsteadiness on feet: Secondary | ICD-10-CM

## 2016-08-14 MED ORDER — TRAZODONE HCL 50 MG PO TABS: 50.0000 mg | ORAL_TABLET | Freq: Every day | ORAL | 5 refills | Status: AC
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
## 2016-08-14 NOTE — Addendum Note (Signed)
Addended by: Melvyn NovasHMEIER, Wendie Diskin on: 08/14/2016 11:46 AM   Modules accepted: Orders

## 2016-08-14 NOTE — Patient Instructions (Signed)
Trazodone tablets What is this medicine? TRAZODONE (TRAZ oh done) is used to treat depression. This medicine may be used for other purposes; ask your health care provider or pharmacist if you have questions. COMMON BRAND NAME(S): Desyrel What should I tell my health care provider before I take this medicine? They need to know if you have any of these conditions: -attempted suicide or thinking about it -bipolar disorder -bleeding problems -glaucoma -heart disease, or previous heart attack -irregular heart beat -kidney or liver disease -low levels of sodium in the blood -an unusual or allergic reaction to trazodone, other medicines, foods, dyes or preservatives -pregnant or trying to get pregnant -breast-feeding How should I use this medicine? Take this medicine by mouth with a glass of water. Follow the directions on the prescription label. Take this medicine shortly after a meal or a light snack. Take your medicine at regular intervals. Do not take your medicine more often than directed. Do not stop taking this medicine suddenly except upon the advice of your doctor. Stopping this medicine too quickly may cause serious side effects or your condition may worsen. A special MedGuide will be given to you by the pharmacist with each prescription and refill. Be sure to read this information carefully each time. Talk to your pediatrician regarding the use of this medicine in children. Special care may be needed. Overdosage: If you think you have taken too much of this medicine contact a poison control center or emergency room at once. NOTE: This medicine is only for you. Do not share this medicine with others. What if I miss a dose? If you miss a dose, take it as soon as you can. If it is almost time for your next dose, take only that dose. Do not take double or extra doses. What may interact with this medicine? Do not take this medicine with any of the following medications: -certain medicines  for fungal infections like fluconazole, itraconazole, ketoconazole, posaconazole, voriconazole -cisapride -dofetilide -dronedarone -linezolid -MAOIs like Carbex, Eldepryl, Marplan, Nardil, and Parnate -mesoridazine -methylene blue (injected into a vein) -pimozide -saquinavir -thioridazine -ziprasidone This medicine may also interact with the following medications: -alcohol -antiviral medicines for HIV or AIDS -aspirin and aspirin-like medicines -barbiturates like phenobarbital -certain medicines for blood pressure, heart disease, irregular heart beat -certain medicines for depression, anxiety, or psychotic disturbances -certain medicines for migraine headache like almotriptan, eletriptan, frovatriptan, naratriptan, rizatriptan, sumatriptan, zolmitriptan -certain medicines for seizures like carbamazepine and phenytoin -certain medicines for sleep -certain medicines that treat or prevent blood clots like dalteparin, enoxaparin, warfarin -digoxin -fentanyl -lithium -NSAIDS, medicines for pain and inflammation, like ibuprofen or naproxen -other medicines that prolong the QT interval (cause an abnormal heart rhythm) -rasagiline -supplements like St. John's wort, kava kava, valerian -tramadol -tryptophan This list may not describe all possible interactions. Give your health care provider a list of all the medicines, herbs, non-prescription drugs, or dietary supplements you use. Also tell them if you smoke, drink alcohol, or use illegal drugs. Some items may interact with your medicine. What should I watch for while using this medicine? Tell your doctor if your symptoms do not get better or if they get worse. Visit your doctor or health care professional for regular checks on your progress. Because it may take several weeks to see the full effects of this medicine, it is important to continue your treatment as prescribed by your doctor. Patients and their families should watch out for new  or worsening thoughts of suicide or depression. Also   watch out for sudden changes in feelings such as feeling anxious, agitated, panicky, irritable, hostile, aggressive, impulsive, severely restless, overly excited and hyperactive, or not being able to sleep. If this happens, especially at the beginning of treatment or after a change in dose, call your health care professional. You may get drowsy or dizzy. Do not drive, use machinery, or do anything that needs mental alertness until you know how this medicine affects you. Do not stand or sit up quickly, especially if you are an older patient. This reduces the risk of dizzy or fainting spells. Alcohol may interfere with the effect of this medicine. Avoid alcoholic drinks. This medicine may cause dry eyes and blurred vision. If you wear contact lenses you may feel some discomfort. Lubricating drops may help. See your eye doctor if the problem does not go away or is severe. Your mouth may get dry. Chewing sugarless gum, sucking hard candy and drinking plenty of water may help. Contact your doctor if the problem does not go away or is severe. What side effects may I notice from receiving this medicine? Side effects that you should report to your doctor or health care professional as soon as possible: -allergic reactions like skin rash, itching or hives, swelling of the face, lips, or tongue -elevated mood, decreased need for sleep, racing thoughts, impulsive behavior -confusion -fast, irregular heartbeat -feeling faint or lightheaded, falls -feeling agitated, angry, or irritable -loss of balance or coordination -painful or prolonged erections -restlessness, pacing, inability to keep still -suicidal thoughts or other mood changes -tremors -trouble sleeping -seizures -unusual bleeding or bruising Side effects that usually do not require medical attention (report to your doctor or health care professional if they continue or are bothersome): -change in  sex drive or performance -change in appetite or weight -constipation -headache -muscle aches or pains -nausea This list may not describe all possible side effects. Call your doctor for medical advice about side effects. You may report side effects to FDA at 1-800-FDA-1088. Where should I keep my medicine? Keep out of the reach of children. Store at room temperature between 15 and 30 degrees C (59 to 86 degrees F). Protect from light. Keep container tightly closed. Throw away any unused medicine after the expiration date. NOTE: This sheet is a summary. It may not cover all possible information. If you have questions about this medicine, talk to your doctor, pharmacist, or health care provider.  2017 Elsevier/Gold Standard (2015-11-16 16:57:05)  

## 2016-08-15 NOTE — Addendum Note (Signed)
Encounter addended by: Filomena JunglingBrandy M Lacrecia Delval on: 08/15/2016  4:38 PM<BR>    Actions taken: Imaging Exam ended

## 2016-08-15 NOTE — Addendum Note (Signed)
Encounter addended by: Filomena JunglingBrandy M Maddisyn Hegwood on: 08/15/2016  4:35 PM<BR>    Actions taken: Imaging Exam ended
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
## 2016-08-19 ENCOUNTER — Encounter: Payer: Self-pay | Admitting: Physician Assistant

## 2016-08-19 ENCOUNTER — Encounter (INDEPENDENT_AMBULATORY_CARE_PROVIDER_SITE_OTHER): Payer: Self-pay

## 2016-08-19 ENCOUNTER — Ambulatory Visit (INDEPENDENT_AMBULATORY_CARE_PROVIDER_SITE_OTHER): Payer: Medicare HMO | Admitting: Physician Assistant

## 2016-08-19 VITALS — BP 128/70 | HR 71 | Ht 64.0 in | Wt 178.0 lb

## 2016-08-19 DIAGNOSIS — I480 Paroxysmal atrial fibrillation: Secondary | ICD-10-CM | POA: Diagnosis not present

## 2016-08-19 DIAGNOSIS — I10 Essential (primary) hypertension: Secondary | ICD-10-CM

## 2016-08-19 DIAGNOSIS — R55 Syncope and collapse: Secondary | ICD-10-CM

## 2016-08-19 NOTE — Patient Instructions (Signed)
Medication Instructions:  Your physician recommends that you continue on your current medications as directed. Please refer to the Current Medication list given to you today.  Labwork: NONE ORDERED  Testing/Procedures: NONE ORDERED  Follow-Up: Your physician wants you to follow-up in: 6 MONTHS WITH DR. SMITH You will receive a reminder letter in the mail two months in advance. If you don't receive a letter, please call our office to schedule the follow-up appointment.  Any Other Special Instructions Will Be Listed Below (If Applicable).  If you need a refill on your cardiac medications before your next appointment, please call your pharmacy. 

## 2016-08-20 DIAGNOSIS — R69 Illness, unspecified: Secondary | ICD-10-CM | POA: Diagnosis not present

## 2016-08-21 ENCOUNTER — Encounter: Payer: Self-pay | Admitting: Physical Medicine & Rehabilitation

## 2016-08-21 ENCOUNTER — Ambulatory Visit: Payer: Medicare HMO | Admitting: Rehabilitative and Restorative Service Providers"

## 2016-08-26 DIAGNOSIS — G450 Vertebro-basilar artery syndrome: Secondary | ICD-10-CM | POA: Diagnosis not present

## 2016-08-26 DIAGNOSIS — I482 Chronic atrial fibrillation: Secondary | ICD-10-CM | POA: Diagnosis not present

## 2016-08-26 DIAGNOSIS — I10 Essential (primary) hypertension: Secondary | ICD-10-CM | POA: Diagnosis not present

## 2016-08-26 DIAGNOSIS — G9009 Other idiopathic peripheral autonomic neuropathy: Secondary | ICD-10-CM | POA: Diagnosis not present

## 2016-08-28 ENCOUNTER — Ambulatory Visit: Payer: Medicare HMO | Admitting: Physical Therapy

## 2016-09-02 DIAGNOSIS — H401132 Primary open-angle glaucoma, bilateral, moderate stage: Secondary | ICD-10-CM | POA: Diagnosis not present

## 2016-09-03 ENCOUNTER — Ambulatory Visit: Payer: Medicare HMO | Attending: Neurology | Admitting: Physical Therapy

## 2016-09-03 DIAGNOSIS — R2681 Unsteadiness on feet: Secondary | ICD-10-CM

## 2016-09-03 DIAGNOSIS — G8929 Other chronic pain: Secondary | ICD-10-CM | POA: Diagnosis present

## 2016-09-03 DIAGNOSIS — M545 Low back pain: Secondary | ICD-10-CM | POA: Diagnosis present

## 2016-09-03 DIAGNOSIS — R2689 Other abnormalities of gait and mobility: Secondary | ICD-10-CM | POA: Insufficient documentation

## 2016-09-03 DIAGNOSIS — M6281 Muscle weakness (generalized): Secondary | ICD-10-CM

## 2016-09-03 DIAGNOSIS — Z9181 History of falling: Secondary | ICD-10-CM | POA: Diagnosis present
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
## 2016-09-13 ENCOUNTER — Encounter: Payer: Self-pay | Admitting: Physical Medicine & Rehabilitation

## 2016-09-13 ENCOUNTER — Encounter: Payer: Medicare HMO | Attending: Physical Medicine & Rehabilitation | Admitting: Physical Medicine & Rehabilitation

## 2016-09-13 VITALS — BP 144/74 | HR 106

## 2016-09-13 DIAGNOSIS — W19XXXA Unspecified fall, initial encounter: Secondary | ICD-10-CM

## 2016-09-13 DIAGNOSIS — M4807 Spinal stenosis, lumbosacral region: Secondary | ICD-10-CM | POA: Insufficient documentation

## 2016-09-13 DIAGNOSIS — R269 Unspecified abnormalities of gait and mobility: Secondary | ICD-10-CM

## 2016-09-13 DIAGNOSIS — R2681 Unsteadiness on feet: Secondary | ICD-10-CM | POA: Diagnosis not present

## 2016-09-13 DIAGNOSIS — I480 Paroxysmal atrial fibrillation: Secondary | ICD-10-CM

## 2016-09-13 DIAGNOSIS — G894 Chronic pain syndrome: Secondary | ICD-10-CM | POA: Diagnosis not present

## 2016-09-13 DIAGNOSIS — G479 Sleep disorder, unspecified: Secondary | ICD-10-CM

## 2016-09-13 DIAGNOSIS — M545 Low back pain, unspecified: Secondary | ICD-10-CM

## 2016-09-13 DIAGNOSIS — G8929 Other chronic pain: Secondary | ICD-10-CM | POA: Diagnosis not present

## 2016-09-13 MED ORDER — DICLOFENAC SODIUM 1 % TD GEL: 2.0000 g | Freq: Four times a day (QID) | TRANSDERMAL | 1 refills | Status: DC

## 2016-09-13 NOTE — Addendum Note (Signed)
Addended by: Maryla MorrowPATEL, Darinda [place] A on: 09/13/2016 03:47 PM   Modules accepted: Level of Service
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
## 2016-09-16 ENCOUNTER — Ambulatory Visit (INDEPENDENT_AMBULATORY_CARE_PROVIDER_SITE_OTHER): Payer: Medicare HMO | Admitting: *Deleted

## 2016-09-16 DIAGNOSIS — I10 Essential (primary) hypertension: Secondary | ICD-10-CM | POA: Diagnosis not present

## 2016-09-16 NOTE — Progress Notes (Signed)
Carelink Summary Report / Loop Recorder 

## 2016-09-16 NOTE — Progress Notes (Signed)
I appreciate the evauation by Dr Allena KatzPatel for gait an  Melvyn NovasHMEIER,Vedanshi Massaro, MD

## 2016-09-18 ENCOUNTER — Ambulatory Visit: Payer: Medicare HMO | Admitting: Physical Therapy

## 2016-09-18 VITALS — BP 126/66

## 2016-09-18 DIAGNOSIS — R2689 Other abnormalities of gait and mobility: Secondary | ICD-10-CM

## 2016-09-18 DIAGNOSIS — M545 Low back pain, unspecified: Secondary | ICD-10-CM

## 2016-09-18 DIAGNOSIS — G8929 Other chronic pain: Secondary | ICD-10-CM

## 2016-09-18 DIAGNOSIS — R2681 Unsteadiness on feet: Secondary | ICD-10-CM | POA: Diagnosis not present

## 2016-09-18 DIAGNOSIS — Z9181 History of falling: Secondary | ICD-10-CM

## 2016-09-18 DIAGNOSIS — M6281 Muscle weakness (generalized): Secondary | ICD-10-CM
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
## 2016-09-20 ENCOUNTER — Ambulatory Visit: Payer: Medicare HMO | Admitting: Physical Therapy

## 2016-09-20 DIAGNOSIS — Z9181 History of falling: Secondary | ICD-10-CM

## 2016-09-20 DIAGNOSIS — G8929 Other chronic pain: Secondary | ICD-10-CM

## 2016-09-20 DIAGNOSIS — M545 Low back pain: Secondary | ICD-10-CM

## 2016-09-20 DIAGNOSIS — M6281 Muscle weakness (generalized): Secondary | ICD-10-CM

## 2016-09-20 DIAGNOSIS — R2689 Other abnormalities of gait and mobility: Secondary | ICD-10-CM

## 2016-09-20 DIAGNOSIS — R2681 Unsteadiness on feet: Secondary | ICD-10-CM | POA: Diagnosis not present
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
## 2016-09-20 NOTE — Patient Instructions (Signed)
HIP: Hamstrings - Short Sitting    Rest leg on raised surface. Keep knee straight. Lift chest. Hold __30_ seconds.  Repeat with other leg. _3__ reps per set, _1__ sets per day, __5-7_ days per week  Copyright  VHI. All rights reserved.    PELVIC STABILIZATION: Pelvic Tilt (Lying)    Exhaling, pull belly toward spine, tilting pelvis forward. Hold for 5 seconds. Repeat __10_ times. Do _1__ times per day.  Copyright  VHI. All rights reserved.

## 2016-09-24 ENCOUNTER — Ambulatory Visit: Payer: Medicare HMO | Admitting: Physical Therapy

## 2016-09-24 DIAGNOSIS — R2681 Unsteadiness on feet: Secondary | ICD-10-CM | POA: Diagnosis not present

## 2016-09-24 DIAGNOSIS — M6281 Muscle weakness (generalized): Secondary | ICD-10-CM

## 2016-09-24 DIAGNOSIS — M545 Low back pain, unspecified: Secondary | ICD-10-CM

## 2016-09-24 DIAGNOSIS — R2689 Other abnormalities of gait and mobility: Secondary | ICD-10-CM

## 2016-09-24 DIAGNOSIS — Z9181 History of falling: Secondary | ICD-10-CM

## 2016-09-24 DIAGNOSIS — G8929 Other chronic pain: Secondary | ICD-10-CM
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
## 2016-09-29 LAB — CUP PACEART REMOTE DEVICE CHECK
Date Time Interrogation Session: 20180317060742
MDC IDC PG IMPLANT DT: 20150429

## 2016-09-29 NOTE — Progress Notes (Signed)
Carelink summary report received. Battery status OK. Normal device function. No new brady or pause episodes. 1 symptom- no ECG. 2 tachy- 1 w/ ECG appears irreg. VS. 4 AF 0.3% +Xarelto, 1 w/ ECG appears AF. Monthly summary reports and ROV/PRN

## 2016-10-02 ENCOUNTER — Ambulatory Visit: Payer: Medicare HMO | Attending: Neurology | Admitting: Physical Therapy

## 2016-10-02 ENCOUNTER — Encounter: Payer: Self-pay | Admitting: Physical Therapy

## 2016-10-02 DIAGNOSIS — M6281 Muscle weakness (generalized): Secondary | ICD-10-CM | POA: Insufficient documentation

## 2016-10-02 DIAGNOSIS — Z9181 History of falling: Secondary | ICD-10-CM | POA: Insufficient documentation

## 2016-10-02 DIAGNOSIS — R2689 Other abnormalities of gait and mobility: Secondary | ICD-10-CM | POA: Insufficient documentation

## 2016-10-02 DIAGNOSIS — G8929 Other chronic pain: Secondary | ICD-10-CM | POA: Insufficient documentation

## 2016-10-02 DIAGNOSIS — M545 Low back pain: Secondary | ICD-10-CM | POA: Insufficient documentation

## 2016-10-02 DIAGNOSIS — R2681 Unsteadiness on feet: Secondary | ICD-10-CM | POA: Insufficient documentation

## 2016-10-02 NOTE — Patient Instructions (Addendum)
Fall Prevention in the Home Falls can cause injuries. They can happen to people of all ages. There are many things you can do to make your home safe and to help prevent falls. What can I do on the outside of my home?  Regularly fix the edges of walkways and driveways and fix any cracks.  Remove anything that might make you trip as you walk through a door, such as a raised step or threshold.  Trim any bushes or trees on the path to your home.  Use bright outdoor lighting.  Clear any walking paths of anything that might make someone trip, such as rocks or tools.  Regularly check to see if handrails are loose or broken. Make sure that both sides of any steps have handrails.  Any raised decks and porches should have guardrails on the edges.  Have any leaves, snow, or ice cleared regularly.  Use sand or salt on walking paths during winter.  Clean up any spills in your garage right away. This includes oil or grease spills. What can I do in the bathroom?  Use night lights.  Install grab bars by the toilet and in the tub and shower. Do not use towel bars as grab bars.  Use non-skid mats or decals in the tub or shower.  If you need to sit down in the shower, use a plastic, non-slip stool.  Keep the floor dry. Clean up any water that spills on the floor as soon as it happens.  Remove soap buildup in the tub or shower regularly.  Attach bath mats securely with double-sided non-slip rug tape.  Do not have throw rugs and other things on the floor that can make you trip. What can I do in the bedroom?  Use night lights.  Make sure that you have a light by your bed that is easy to reach.  Do not use any sheets or blankets that are too big for your bed. They should not hang down onto the floor.  Have a firm chair that has side arms. You can use this for support while you get dressed.  Do not have throw rugs and other things on the floor that can make you trip. What can I do in the  kitchen?  Clean up any spills right away.  Avoid walking on wet floors.  Keep items that you use a lot in easy-to-reach places.  If you need to reach something above you, use a strong step stool that has a grab bar.  Keep electrical cords out of the way.  Do not use floor polish or wax that makes floors slippery. If you must use wax, use non-skid floor wax.  Do not have throw rugs and other things on the floor that can make you trip. What can I do with my stairs?  Do not leave any items on the stairs.  Make sure that there are handrails on both sides of the stairs and use them. Fix handrails that are broken or loose. Make sure that handrails are as long as the stairways.  Check any carpeting to make sure that it is firmly attached to the stairs. Fix any carpet that is loose or worn.  Avoid having throw rugs at the top or bottom of the stairs. If you do have throw rugs, attach them to the floor with carpet tape.  Make sure that you have a light switch at the top of the stairs and the bottom of the stairs. If you do   not have them, ask someone to add them for you. What else can I do to help prevent falls?  Wear shoes that:  Do not have high heels.  Have rubber bottoms.  Are comfortable and fit you well.  Are closed at the toe. Do not wear sandals.  If you use a stepladder:  Make sure that it is fully opened. Do not climb a closed stepladder.  Make sure that both sides of the stepladder are locked into place.  Ask someone to hold it for you, if possible.  Clearly mark and make sure that you can see:  Any grab bars or handrails.  First and last steps.  Where the edge of each step is.  Use tools that help you move around (mobility aids) if they are needed. These include:  Canes.  Walkers.  Scooters.  Crutches.  Turn on the lights when you go into a dark area. Replace any light bulbs as soon as they burn out.  Set up your furniture so you have a clear path.  Avoid moving your furniture around.  If any of your floors are uneven, fix them.  If there are any pets around you, be aware of where they are.  Review your medicines with your doctor. Some medicines can make you feel dizzy. This can increase your chance of falling. Ask your doctor what other things that you can do to help prevent falls. This information is not intended to replace advice given to you by your health care provider. Make sure you discuss any questions you have with your health care provider. Document Released: 04/13/2009 Document Revised: 11/23/2015 Document Reviewed: 07/22/2014 Elsevier Interactive Patient Education  2017 Elsevier Inc.  
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
## 2016-10-04 ENCOUNTER — Encounter: Payer: Self-pay | Admitting: Physical Therapy

## 2016-10-04 ENCOUNTER — Ambulatory Visit: Payer: Medicare HMO | Admitting: Physical Therapy

## 2016-10-04 DIAGNOSIS — M545 Low back pain: Secondary | ICD-10-CM

## 2016-10-04 DIAGNOSIS — R2681 Unsteadiness on feet: Secondary | ICD-10-CM | POA: Diagnosis not present

## 2016-10-04 DIAGNOSIS — Z9181 History of falling: Secondary | ICD-10-CM

## 2016-10-04 DIAGNOSIS — M6281 Muscle weakness (generalized): Secondary | ICD-10-CM

## 2016-10-04 DIAGNOSIS — G8929 Other chronic pain: Secondary | ICD-10-CM

## 2016-10-04 DIAGNOSIS — R2689 Other abnormalities of gait and mobility: Secondary | ICD-10-CM
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
## 2016-10-08 ENCOUNTER — Ambulatory Visit: Payer: Medicare HMO | Admitting: Physical Therapy

## 2016-10-08 ENCOUNTER — Encounter: Payer: Self-pay | Admitting: Physical Therapy

## 2016-10-08 DIAGNOSIS — R2689 Other abnormalities of gait and mobility: Secondary | ICD-10-CM

## 2016-10-08 DIAGNOSIS — R2681 Unsteadiness on feet: Secondary | ICD-10-CM | POA: Diagnosis not present

## 2016-10-08 DIAGNOSIS — Z9181 History of falling: Secondary | ICD-10-CM

## 2016-10-08 DIAGNOSIS — M6281 Muscle weakness (generalized): Secondary | ICD-10-CM
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
## 2016-10-11 ENCOUNTER — Ambulatory Visit: Payer: Medicare HMO | Admitting: Rehabilitative and Restorative Service Providers"

## 2016-10-11 DIAGNOSIS — R2689 Other abnormalities of gait and mobility: Secondary | ICD-10-CM

## 2016-10-11 DIAGNOSIS — R2681 Unsteadiness on feet: Secondary | ICD-10-CM

## 2016-10-11 DIAGNOSIS — Z9181 History of falling: Secondary | ICD-10-CM

## 2016-10-11 DIAGNOSIS — M6281 Muscle weakness (generalized): Secondary | ICD-10-CM
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
## 2016-10-14 ENCOUNTER — Ambulatory Visit (INDEPENDENT_AMBULATORY_CARE_PROVIDER_SITE_OTHER): Payer: Medicare HMO | Admitting: *Deleted

## 2016-10-14 DIAGNOSIS — R55 Syncope and collapse: Secondary | ICD-10-CM | POA: Diagnosis not present

## 2016-10-14 NOTE — Progress Notes (Signed)
Carelink Summary Report / Loop Recorder 

## 2016-10-16 ENCOUNTER — Ambulatory Visit: Payer: Medicare HMO | Admitting: Physical Therapy

## 2016-10-17 ENCOUNTER — Ambulatory Visit: Payer: Medicare HMO | Admitting: Physical Therapy

## 2016-10-17 ENCOUNTER — Encounter: Payer: Self-pay | Admitting: Physical Therapy

## 2016-10-17 DIAGNOSIS — R2681 Unsteadiness on feet: Secondary | ICD-10-CM

## 2016-10-17 DIAGNOSIS — M6281 Muscle weakness (generalized): Secondary | ICD-10-CM

## 2016-10-17 DIAGNOSIS — R2689 Other abnormalities of gait and mobility: Secondary | ICD-10-CM

## 2016-10-17 DIAGNOSIS — Z9181 History of falling: Secondary | ICD-10-CM
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
## 2016-10-18 ENCOUNTER — Ambulatory Visit: Payer: Medicare HMO | Admitting: Physical Therapy

## 2016-10-22 ENCOUNTER — Ambulatory Visit: Payer: Medicare HMO | Admitting: Rehabilitative and Restorative Service Providers"

## 2016-10-22 ENCOUNTER — Encounter: Payer: Self-pay | Admitting: Adult Health

## 2016-10-22 ENCOUNTER — Ambulatory Visit (INDEPENDENT_AMBULATORY_CARE_PROVIDER_SITE_OTHER): Payer: Medicare HMO | Admitting: Adult Health

## 2016-10-22 VITALS — BP 116/58 | HR 78 | Resp 20 | Ht 64.0 in | Wt 181.0 lb

## 2016-10-22 DIAGNOSIS — M6281 Muscle weakness (generalized): Secondary | ICD-10-CM

## 2016-10-22 DIAGNOSIS — R2689 Other abnormalities of gait and mobility: Secondary | ICD-10-CM

## 2016-10-22 DIAGNOSIS — M5417 Radiculopathy, lumbosacral region: Secondary | ICD-10-CM | POA: Diagnosis not present

## 2016-10-22 DIAGNOSIS — R269 Unspecified abnormalities of gait and mobility: Secondary | ICD-10-CM | POA: Diagnosis not present

## 2016-10-22 DIAGNOSIS — R2681 Unsteadiness on feet: Secondary | ICD-10-CM | POA: Diagnosis not present
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
## 2016-10-22 NOTE — Patient Instructions (Signed)
Continue Trazodone for sleep Use walker when ambulating  Continue PT If your symptoms worsen or you develop new symptoms please let us know.

## 2016-10-22 NOTE — Progress Notes (Signed)
I have read the note, and I agree with the clinical assessment and plan.  Demetrias Goodbar KEITH   

## 2016-10-23 ENCOUNTER — Ambulatory Visit: Payer: Medicare HMO | Admitting: Physical Therapy

## 2016-10-23 ENCOUNTER — Encounter: Payer: Self-pay | Admitting: Physical Therapy

## 2016-10-23 DIAGNOSIS — Z9181 History of falling: Secondary | ICD-10-CM

## 2016-10-23 DIAGNOSIS — M6281 Muscle weakness (generalized): Secondary | ICD-10-CM

## 2016-10-23 DIAGNOSIS — R2681 Unsteadiness on feet: Secondary | ICD-10-CM

## 2016-10-23 DIAGNOSIS — R2689 Other abnormalities of gait and mobility: Secondary | ICD-10-CM
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
## 2016-10-25 ENCOUNTER — Encounter: Payer: Medicare HMO | Attending: Physical Medicine & Rehabilitation | Admitting: Physical Medicine & Rehabilitation

## 2016-10-25 DIAGNOSIS — M4807 Spinal stenosis, lumbosacral region: Secondary | ICD-10-CM | POA: Insufficient documentation

## 2016-10-25 DIAGNOSIS — R2681 Unsteadiness on feet: Secondary | ICD-10-CM | POA: Insufficient documentation

## 2016-10-26 LAB — CUP PACEART REMOTE DEVICE CHECK
Date Time Interrogation Session: 20180416063727
MDC IDC PG IMPLANT DT: 20150429

## 2016-10-26 NOTE — Progress Notes (Signed)
Carelink summary report received. Battery status OK. Normal device function. No new symptom episodes, tachy episodes, brady, or pause episodes. No new AF episodes. Monthly summary reports and ROV/PRN 

## 2016-10-28 DIAGNOSIS — M1990 Unspecified osteoarthritis, unspecified site: Secondary | ICD-10-CM | POA: Diagnosis not present

## 2016-10-28 DIAGNOSIS — R350 Frequency of micturition: Secondary | ICD-10-CM | POA: Diagnosis not present

## 2016-10-28 DIAGNOSIS — G9009 Other idiopathic peripheral autonomic neuropathy: Secondary | ICD-10-CM | POA: Diagnosis not present

## 2016-10-28 DIAGNOSIS — I4891 Unspecified atrial fibrillation: Secondary | ICD-10-CM | POA: Diagnosis not present

## 2016-10-28 NOTE — Progress Notes (Signed)
I agree with the assessment and plan as directed by NP .The patient is known to me .   Bich Mchaney, MD  

## 2016-10-30 ENCOUNTER — Encounter: Payer: Self-pay | Admitting: Physical Therapy

## 2016-10-30 ENCOUNTER — Ambulatory Visit: Payer: Medicare HMO | Attending: Neurology | Admitting: Physical Therapy

## 2016-10-30 DIAGNOSIS — R2689 Other abnormalities of gait and mobility: Secondary | ICD-10-CM

## 2016-10-30 DIAGNOSIS — R2681 Unsteadiness on feet: Secondary | ICD-10-CM | POA: Diagnosis not present

## 2016-10-30 DIAGNOSIS — M6281 Muscle weakness (generalized): Secondary | ICD-10-CM | POA: Insufficient documentation

## 2016-10-30 DIAGNOSIS — Z9181 History of falling: Secondary | ICD-10-CM | POA: Insufficient documentation

## 2016-10-31 ENCOUNTER — Ambulatory Visit: Payer: Medicare HMO | Admitting: Rehabilitative and Restorative Service Providers"
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
## 2016-11-01 ENCOUNTER — Ambulatory Visit: Payer: Medicare HMO | Admitting: Physical Therapy

## 2016-11-01 ENCOUNTER — Encounter: Payer: Self-pay | Admitting: Physical Therapy

## 2016-11-01 DIAGNOSIS — M6281 Muscle weakness (generalized): Secondary | ICD-10-CM | POA: Diagnosis not present

## 2016-11-01 DIAGNOSIS — R2689 Other abnormalities of gait and mobility: Secondary | ICD-10-CM

## 2016-11-01 DIAGNOSIS — R2681 Unsteadiness on feet: Secondary | ICD-10-CM

## 2016-11-01 DIAGNOSIS — Z9181 History of falling: Secondary | ICD-10-CM | POA: Diagnosis not present
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
## 2016-11-06 ENCOUNTER — Encounter: Payer: Self-pay | Admitting: Physical Therapy

## 2016-11-06 ENCOUNTER — Ambulatory Visit: Payer: Medicare HMO | Admitting: Physical Therapy

## 2016-11-06 DIAGNOSIS — R2689 Other abnormalities of gait and mobility: Secondary | ICD-10-CM | POA: Diagnosis not present

## 2016-11-06 DIAGNOSIS — Z9181 History of falling: Secondary | ICD-10-CM

## 2016-11-06 DIAGNOSIS — M6281 Muscle weakness (generalized): Secondary | ICD-10-CM | POA: Diagnosis not present

## 2016-11-06 DIAGNOSIS — R2681 Unsteadiness on feet: Secondary | ICD-10-CM

## 2016-11-07 ENCOUNTER — Ambulatory Visit: Payer: Medicare HMO | Admitting: Rehabilitative and Restorative Service Providers"
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
## 2016-11-08 ENCOUNTER — Ambulatory Visit: Payer: Medicare HMO | Admitting: Rehabilitative and Restorative Service Providers"

## 2016-11-08 ENCOUNTER — Encounter: Payer: Self-pay | Admitting: Rehabilitative and Restorative Service Providers"

## 2016-11-08 DIAGNOSIS — G8929 Other chronic pain: Secondary | ICD-10-CM

## 2016-11-08 DIAGNOSIS — M6281 Muscle weakness (generalized): Secondary | ICD-10-CM

## 2016-11-08 DIAGNOSIS — R2689 Other abnormalities of gait and mobility: Secondary | ICD-10-CM

## 2016-11-08 DIAGNOSIS — M545 Low back pain: Secondary | ICD-10-CM

## 2016-11-08 DIAGNOSIS — Z9181 History of falling: Secondary | ICD-10-CM

## 2016-11-08 DIAGNOSIS — R2681 Unsteadiness on feet: Secondary | ICD-10-CM

## 2016-11-08 NOTE — Therapy (Signed)
[facility] [address] [place], [place], [address] Phone: [phone]   Fax:  [phone]  Patient Details  Name: Kristen Garrison MRN: [id number] Date of Birth: 1926/10/10 Referring Provider:  No ref. provider found  Encounter Date: last encounter 11/06/16   UPDATED LTGs:      PT Long Term Goals - 11/08/16 0917      PT LONG TERM GOAL #1   Title independent with HEP (10/29/16)   Baseline Per report, patient notes she can do all activities 10/22/2016   Time 8   Period Weeks   Status Achieved     PT LONG TERM GOAL #2   Title improve 5X STS to < 25 sec for improved functional strength (MODIFIED LONG TERM GOAL DATE: 11/28/16)   Baseline on 4/24: scores 28.47 with bilat UE support at arm rests.   Time 8   Period Weeks   Status Revised     PT LONG TERM GOAL #3   Title improve gait velocity to > 2.4 ft/sec for improved community access  (MODIFIED LONG TERM GOAL DATE: 11/28/16)   Baseline on 10/22/16 patient scores 2.22 ft/sec for gait speed.    Time 8   Period Weeks   Status Revised     PT LONG TERM GOAL #4   Title improve timed up and go to < 20 sec with LRAD for improved functional mobility.  (MODIFIED LONG TERM GOAL DATE: 11/28/16)   Baseline ON 10/22/16 patient scores 20.81 seconds.   Time 8   Period Weeks   Status Revised     PT LONG TERM GOAL #5   Title amb > 500' on various indoor/paved outdoor surfaces with LRAD and modified independent for improved functional mobiity (10/29/16)   Time 8   Period Weeks   Status Achieved          Plan - 11/08/16 0923    Clinical Impression Statement Continue working towards unmet LTGs.   Rehab Potential Good   Clinical Impairments Affecting Rehab Potential severity of deficits, multiple comorbidities, ?decreased cognition/memory   PT Frequency 2x / week   PT Duration 4 weeks   PT Treatment/Interventions ADLs/Self Care Home Management;Cryotherapy;Moist Heat;DME  Instruction;Gait training;Stair training;Functional mobility training;Therapeutic activities;Therapeutic exercise;Balance training;Neuromuscular re-education;Manual techniques;Patient/family education;Vestibular   PT Next Visit Plan Continue working towards unmet LTGs.   Consulted and Agree with Plan of Care Patient        Margretta DittyWEAVER,[employer], PT 11/08/2016, 11:57 AM  [facility] [address] [place], [place], [address] Phone: [phone]   Fax:  [phone]

## 2016-11-11 ENCOUNTER — Encounter: Payer: Self-pay | Admitting: Rehabilitative and Restorative Service Providers"

## 2016-11-11 NOTE — Therapy (Signed)
[facility] [address] [place], [place], [address] Phone: [phone]   Fax:  [phone]  Patient Details  Name: Kristen Garrison MRN: [id number] Date of Birth: 24-Aug-1926 Referring Provider:  No ref. provider found  Encounter Date: last encounter 11/06/2016  PHYSICAL THERAPY DISCHARGE SUMMARY  Visits from Start of Care: 14  Current functional level related to goals / functional outcomes:     PT Short Term Goals - 10/08/16 1914      PT SHORT TERM GOAL #1   Title verbalize understanding of fall prevention strategies (10/01/16)   Baseline 10/02/16: pt provided fall prevention stategies today with handouts. met on 10/08/16 with follow up to information provided.   Time 4   Period Weeks   Status Achieved     PT SHORT TERM GOAL #2   Title improve 5X STS to < 33 sec for improved functional strength (10/01/16)   Baseline 10/02/16: 32.44 sec's with UE support   Status Achieved     PT SHORT TERM GOAL #3   Title improve gait velocity to > 1.8 ft/sec for decreased fall risk (10/01/16)   Baseline 10/02/16: 2.07 ft/sec with RW   Status Achieved     PT SHORT TERM GOAL #4   Title improve timed up and go to < 25 sec with LRAD for improved function (10/01/16)   Baseline 10/02/16: 22.72 with RW   Status Achieved     PT SHORT TERM GOAL #5   Title amb > 200' with LRAD with supervision on various indoor/paved outdoor surfaces for improved functional mobility (10/01/16)   Baseline 10/02/16: met today   Status Achieved         PT Long Term Goals - 11/08/16 0917      PT LONG TERM GOAL #1   Title independent with HEP (10/29/16)   Baseline Per report, patient notes she can do all activities 10/22/2016   Time 8   Period Weeks   Status Achieved     PT LONG TERM GOAL #2   Title improve 5X STS to < 25 sec for improved functional strength (MODIFIED LONG TERM GOAL DATE: 11/28/16)   Baseline on 4/24: scores 28.47 with bilat UE support at arm rests.   Time 8   Period Weeks   Status Revised     PT LONG TERM GOAL #3   Title improve gait velocity to > 2.4 ft/sec for improved community access  (MODIFIED LONG TERM GOAL DATE: 11/28/16)   Baseline on 10/22/16 patient scores 2.22 ft/sec for gait speed.    Time 8   Period Weeks   Status Revised     PT LONG TERM GOAL #4   Title improve timed up and go to < 20 sec with LRAD for improved functional mobility.  (MODIFIED LONG TERM GOAL DATE: 11/28/16)   Baseline ON 10/22/16 patient scores 20.81 seconds.   Time 8   Period Weeks   Status Revised     PT LONG TERM GOAL #5   Title amb > 500' on various indoor/paved outdoor surfaces with LRAD and modified independent for improved functional mobiity (10/29/16)   Time 8   Period Weeks   Status Achieved     *The patient did not return for further visits after goal update.  See above for patient status.   Remaining deficits: Decreased balance Abnormality of gait Multifactorial fall risk   Education / Equipment: HEP, home safety.  Plan: Patient agrees to discharge.  Patient goals were partially  met. Patient is being discharged due to meeting the stated rehab goals.  ?????        Thank you for the referral of this patient. Rudell Cobb, MPT    [place] 11/11/2016, 8:58 AM  [facility] [address] [place], [place], [address] Phone: [phone]   Fax:  [phone]

## 2016-11-12 ENCOUNTER — Telehealth: Payer: Self-pay | Admitting: Cardiology

## 2016-11-12 NOTE — Telephone Encounter (Signed)
Spoke w/ pt and informed her that remote transmission is automatic and she doesn't have to do anything. Pt verbalized understanding.

## 2016-11-12 NOTE — Telephone Encounter (Signed)
New Message:    Please call,she does not know how to send her transmission.

## 2016-11-13 ENCOUNTER — Ambulatory Visit (INDEPENDENT_AMBULATORY_CARE_PROVIDER_SITE_OTHER): Payer: Medicare HMO | Admitting: *Deleted

## 2016-11-13 DIAGNOSIS — R55 Syncope and collapse: Secondary | ICD-10-CM

## 2016-11-13 NOTE — Progress Notes (Signed)
Carelink Summary Report / Loop Recorder 

## 2016-11-18 DIAGNOSIS — I10 Essential (primary) hypertension: Secondary | ICD-10-CM | POA: Diagnosis not present

## 2016-11-18 DIAGNOSIS — R69 Illness, unspecified: Secondary | ICD-10-CM | POA: Diagnosis not present

## 2016-11-24 LAB — CUP PACEART REMOTE DEVICE CHECK
Date Time Interrogation Session: 20180516063744
Implantable Pulse Generator Implant Date: 20150429

## 2016-11-24 NOTE — Progress Notes (Signed)
Carelink summary report received. Battery status OK. Normal device function. No new symptom episodes, tachy episodes, brady, or pause episodes. No new AF episodes. Monthly summary reports and ROV/PRN 

## 2016-12-13 ENCOUNTER — Ambulatory Visit (INDEPENDENT_AMBULATORY_CARE_PROVIDER_SITE_OTHER): Payer: Medicare HMO | Admitting: *Deleted

## 2016-12-13 DIAGNOSIS — R55 Syncope and collapse: Secondary | ICD-10-CM

## 2016-12-13 NOTE — Progress Notes (Signed)
Carelink Summary Report / Loop Recorder 

## 2016-12-17 ENCOUNTER — Telehealth: Payer: Self-pay | Admitting: *Deleted

## 2016-12-17 NOTE — Telephone Encounter (Signed)
Spoke with patient to request manual transmission for software update. Patient stated she was not home at this time and would call device clinic when she got home to assistance her with sending a manual transmission. Gave device clinic number.

## 2016-12-18 ENCOUNTER — Telehealth: Payer: Self-pay | Admitting: *Deleted

## 2016-12-18 NOTE — Telephone Encounter (Signed)
Follow up    Patient husband calling back to speak with device clinic

## 2016-12-18 NOTE — Telephone Encounter (Signed)
LMTCB (M)/sss Unable to LM on home number.

## 2016-12-18 NOTE — Telephone Encounter (Signed)
Returned call to husband regarding not receiving manual transmission. Husband states it sent successfully and received the green check mark at the end. Will continue to monitor for transmission and will call him back once I receive it. Patient's husband verbalized understanding. Device Clinic direct number given for further questions or concerns.

## 2016-12-18 NOTE — Telephone Encounter (Signed)
Follow up    Pt husband is calling back for pt about loop recorder.

## 2016-12-18 NOTE — Telephone Encounter (Signed)
Spoke to patient and husband about sending in a remote transmission. Verbal instructions provided for husband. Husband verbalized understanding.

## 2016-12-18 NOTE — Telephone Encounter (Signed)
Follow up   Pt is calling to find out if transmission was received.

## 2016-12-18 NOTE — Telephone Encounter (Signed)
°  1. Has your device fired? no ° °2. Is you device beeping? no ° °3. Are you experiencing draining or swelling at device site? no ° °4. Are you calling to see if we received your device transmission? yes ° °5. Have you passed out? no ° °

## 2016-12-19 NOTE — Telephone Encounter (Signed)
LVMOM regarding receiving manual transmission. LINQ at RRT since Querida 18, 2018. Gave Device Clinic number to call back to discuss next steps.

## 2016-12-19 NOTE — Telephone Encounter (Signed)
Confirmed RRT. Scheduled with Francis Dowseenee Ursuy, PA 01/03/17 11:30 to discuss next steps with LINQ.

## 2016-12-23 LAB — CUP PACEART REMOTE DEVICE CHECK
Implantable Pulse Generator Implant Date: 20150429
MDC IDC SESS DTM: 20180620144901

## 2016-12-24 LAB — CUP PACEART REMOTE DEVICE CHECK
Date Time Interrogation Session: 20180615073757
MDC IDC PG IMPLANT DT: 20150429

## 2016-12-31 DIAGNOSIS — G629 Polyneuropathy, unspecified: Secondary | ICD-10-CM | POA: Diagnosis not present

## 2016-12-31 DIAGNOSIS — M1990 Unspecified osteoarthritis, unspecified site: Secondary | ICD-10-CM | POA: Diagnosis not present

## 2017-01-03 ENCOUNTER — Ambulatory Visit (INDEPENDENT_AMBULATORY_CARE_PROVIDER_SITE_OTHER): Payer: Medicare HMO | Admitting: Physician Assistant

## 2017-01-03 ENCOUNTER — Encounter (INDEPENDENT_AMBULATORY_CARE_PROVIDER_SITE_OTHER): Payer: Self-pay

## 2017-01-03 VITALS — BP 142/66 | HR 74 | Ht 64.0 in | Wt 179.0 lb

## 2017-01-03 DIAGNOSIS — I480 Paroxysmal atrial fibrillation: Secondary | ICD-10-CM

## 2017-01-03 DIAGNOSIS — R55 Syncope and collapse: Secondary | ICD-10-CM

## 2017-01-03 DIAGNOSIS — I10 Essential (primary) hypertension: Secondary | ICD-10-CM | POA: Diagnosis not present

## 2017-01-03 DIAGNOSIS — Z79899 Other long term (current) drug therapy: Secondary | ICD-10-CM | POA: Diagnosis not present
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
## 2017-01-03 NOTE — Patient Instructions (Signed)
Medication Instructions:   Your physician recommends that you continue on your current medications as directed. Please refer to the Current Medication list given to you today.   If you need a refill on your cardiac medications before your next appointment, please call your pharmacy.  Labwork: BMET AND CBC TODAY    Testing/Procedures: NONE ORDERED  TODAY   Follow-Up:  IN 3 MONTHS WITH RENEE URSUY   Any Other Special Instructions Will Be Listed Below (If Applicable).

## 2017-01-04 LAB — BASIC METABOLIC PANEL
BUN/Creatinine Ratio: 19 (ref 12–28)
BUN: 22 mg/dL (ref 8–27)
CALCIUM: 9.1 mg/dL (ref 8.7–10.3)
CO2: 25 mmol/L (ref 20–29)
CREATININE: 1.17 mg/dL — AB (ref 0.57–1.00)
Chloride: 103 mmol/L (ref 96–106)
GFR, EST AFRICAN AMERICAN: 48 mL/min/{1.73_m2} — AB (ref >59)
GFR, EST NON AFRICAN AMERICAN: 42 mL/min/{1.73_m2} — AB (ref >59)
Glucose: 74 mg/dL (ref 65–99)
Potassium: 4.5 mmol/L (ref 3.5–5.2)
Sodium: 145 mmol/L — ABNORMAL HIGH (ref 134–144)

## 2017-01-04 LAB — CBC
HEMOGLOBIN: 10.2 g/dL — AB (ref 11.1–15.9)
Hematocrit: 32.4 % — ABNORMAL LOW (ref 34.0–46.6)
MCH: 26.8 pg (ref 26.6–33.0)
MCHC: 31.5 g/dL (ref 31.5–35.7)
MCV: 85 fL (ref 79–97)
Platelets: 412 10*3/uL — ABNORMAL HIGH (ref 150–379)
RBC: 3.81 x10E6/uL (ref 3.77–5.28)
RDW: 16.3 % — ABNORMAL HIGH (ref 12.3–15.4)
WBC: 9.7 10*3/uL (ref 3.4–10.8)

## 2017-01-07 ENCOUNTER — Encounter: Payer: Self-pay | Admitting: *Deleted

## 2017-01-13 ENCOUNTER — Encounter: Payer: Medicare HMO | Admitting: *Deleted

## 2017-01-14 DIAGNOSIS — G629 Polyneuropathy, unspecified: Secondary | ICD-10-CM | POA: Diagnosis not present

## 2017-01-29 DIAGNOSIS — R69 Illness, unspecified: Secondary | ICD-10-CM | POA: Diagnosis not present

## 2017-01-29 DIAGNOSIS — M1990 Unspecified osteoarthritis, unspecified site: Secondary | ICD-10-CM | POA: Diagnosis not present

## 2017-02-24 DIAGNOSIS — R69 Illness, unspecified: Secondary | ICD-10-CM | POA: Diagnosis not present

## 2017-02-24 DIAGNOSIS — R030 Elevated blood-pressure reading, without diagnosis of hypertension: Secondary | ICD-10-CM | POA: Diagnosis not present

## 2017-02-25 DIAGNOSIS — M25562 Pain in left knee: Secondary | ICD-10-CM | POA: Diagnosis not present

## 2017-02-25 DIAGNOSIS — M545 Low back pain: Secondary | ICD-10-CM | POA: Diagnosis not present

## 2017-02-25 DIAGNOSIS — M542 Cervicalgia: Secondary | ICD-10-CM | POA: Diagnosis not present

## 2017-03-17 DIAGNOSIS — I10 Essential (primary) hypertension: Secondary | ICD-10-CM | POA: Diagnosis not present

## 2017-03-17 DIAGNOSIS — R69 Illness, unspecified: Secondary | ICD-10-CM | POA: Diagnosis not present

## 2017-03-18 DIAGNOSIS — F411 Generalized anxiety disorder: Secondary | ICD-10-CM | POA: Diagnosis not present

## 2017-03-18 DIAGNOSIS — R69 Illness, unspecified: Secondary | ICD-10-CM | POA: Diagnosis not present

## 2017-03-25 DIAGNOSIS — R69 Illness, unspecified: Secondary | ICD-10-CM | POA: Diagnosis not present

## 2017-03-27 ENCOUNTER — Other Ambulatory Visit: Payer: Self-pay | Admitting: Internal Medicine

## 2017-03-27 ENCOUNTER — Ambulatory Visit
Admission: RE | Admit: 2017-03-27 | Discharge: 2017-03-27 | Disposition: A | Discharge status: Home / Self Care | Payer: Medicare HMO | Source: Ambulatory Visit | Attending: Internal Medicine | Admitting: Internal Medicine

## 2017-03-27 DIAGNOSIS — M545 Low back pain, unspecified: Secondary | ICD-10-CM

## 2017-03-28 DIAGNOSIS — S32009A Unspecified fracture of unspecified lumbar vertebra, initial encounter for closed fracture: Secondary | ICD-10-CM | POA: Diagnosis not present

## 2017-04-03 DIAGNOSIS — K625 Hemorrhage of anus and rectum: Secondary | ICD-10-CM | POA: Diagnosis not present

## 2017-04-03 DIAGNOSIS — M1990 Unspecified osteoarthritis, unspecified site: Secondary | ICD-10-CM | POA: Diagnosis not present

## 2017-04-17 DIAGNOSIS — R69 Illness, unspecified: Secondary | ICD-10-CM | POA: Diagnosis not present

## 2017-04-17 DIAGNOSIS — I4891 Unspecified atrial fibrillation: Secondary | ICD-10-CM | POA: Diagnosis not present

## 2017-04-24 ENCOUNTER — Telehealth: Payer: Self-pay | Admitting: Internal Medicine

## 2017-04-24 ENCOUNTER — Ambulatory Visit (HOSPITAL_COMMUNITY)
Admission: RE | Admit: 2017-04-24 | Discharge: 2017-04-24 | Disposition: A | Discharge status: Home / Self Care | Payer: Medicare HMO | Source: Ambulatory Visit | Attending: Nurse Practitioner | Admitting: Nurse Practitioner

## 2017-04-24 ENCOUNTER — Encounter (HOSPITAL_COMMUNITY): Payer: Self-pay | Admitting: Nurse Practitioner

## 2017-04-24 VITALS — BP 98/58 | HR 86 | Ht 64.0 in | Wt 173.6 lb

## 2017-04-24 DIAGNOSIS — Z885 Allergy status to narcotic agent status: Secondary | ICD-10-CM | POA: Diagnosis not present

## 2017-04-24 DIAGNOSIS — I959 Hypotension, unspecified: Secondary | ICD-10-CM | POA: Insufficient documentation

## 2017-04-24 DIAGNOSIS — K219 Gastro-esophageal reflux disease without esophagitis: Secondary | ICD-10-CM | POA: Insufficient documentation

## 2017-04-24 DIAGNOSIS — Z9049 Acquired absence of other specified parts of digestive tract: Secondary | ICD-10-CM | POA: Diagnosis not present

## 2017-04-24 DIAGNOSIS — I10 Essential (primary) hypertension: Secondary | ICD-10-CM | POA: Diagnosis not present

## 2017-04-24 DIAGNOSIS — I4891 Unspecified atrial fibrillation: Secondary | ICD-10-CM | POA: Insufficient documentation

## 2017-04-24 DIAGNOSIS — Z79899 Other long term (current) drug therapy: Secondary | ICD-10-CM | POA: Diagnosis not present

## 2017-04-24 DIAGNOSIS — R69 Illness, unspecified: Secondary | ICD-10-CM | POA: Diagnosis not present

## 2017-04-24 DIAGNOSIS — Z9841 Cataract extraction status, right eye: Secondary | ICD-10-CM | POA: Diagnosis not present

## 2017-04-24 DIAGNOSIS — F419 Anxiety disorder, unspecified: Secondary | ICD-10-CM | POA: Diagnosis not present

## 2017-04-24 DIAGNOSIS — Z888 Allergy status to other drugs, medicaments and biological substances status: Secondary | ICD-10-CM | POA: Diagnosis not present

## 2017-04-24 DIAGNOSIS — R9431 Abnormal electrocardiogram [ECG] [EKG]: Secondary | ICD-10-CM | POA: Diagnosis not present

## 2017-04-24 DIAGNOSIS — Z881 Allergy status to other antibiotic agents status: Secondary | ICD-10-CM | POA: Insufficient documentation

## 2017-04-24 DIAGNOSIS — Z882 Allergy status to sulfonamides status: Secondary | ICD-10-CM | POA: Diagnosis not present

## 2017-04-24 DIAGNOSIS — R002 Palpitations: Secondary | ICD-10-CM

## 2017-04-24 DIAGNOSIS — Z9889 Other specified postprocedural states: Secondary | ICD-10-CM | POA: Insufficient documentation

## 2017-04-24 DIAGNOSIS — E7800 Pure hypercholesterolemia, unspecified: Secondary | ICD-10-CM | POA: Insufficient documentation

## 2017-04-24 DIAGNOSIS — Z7952 Long term (current) use of systemic steroids: Secondary | ICD-10-CM | POA: Diagnosis not present

## 2017-04-24 DIAGNOSIS — Z809 Family history of malignant neoplasm, unspecified: Secondary | ICD-10-CM | POA: Insufficient documentation

## 2017-04-24 DIAGNOSIS — G629 Polyneuropathy, unspecified: Secondary | ICD-10-CM | POA: Diagnosis not present

## 2017-04-24 DIAGNOSIS — Z823 Family history of stroke: Secondary | ICD-10-CM | POA: Diagnosis not present

## 2017-04-24 DIAGNOSIS — Z96651 Presence of right artificial knee joint: Secondary | ICD-10-CM | POA: Diagnosis not present

## 2017-04-24 DIAGNOSIS — Z90710 Acquired absence of both cervix and uterus: Secondary | ICD-10-CM | POA: Diagnosis not present

## 2017-04-24 LAB — COMPREHENSIVE METABOLIC PANEL
ALBUMIN: 3.7 g/dL (ref 3.5–5.0)
ALT: 16 U/L (ref 14–54)
ANION GAP: 11 (ref 5–15)
AST: 22 U/L (ref 15–41)
Alkaline Phosphatase: 111 U/L (ref 38–126)
BILIRUBIN TOTAL: 0.6 mg/dL (ref 0.3–1.2)
BUN: 25 mg/dL — ABNORMAL HIGH (ref 6–20)
CALCIUM: 9.2 mg/dL (ref 8.9–10.3)
CO2: 25 mmol/L (ref 22–32)
Chloride: 100 mmol/L — ABNORMAL LOW (ref 101–111)
Creatinine, Ser: 1.41 mg/dL — ABNORMAL HIGH (ref 0.44–1.00)
GFR, EST AFRICAN AMERICAN: 37 mL/min — AB (ref >60)
GFR, EST NON AFRICAN AMERICAN: 32 mL/min — AB (ref >60)
Glucose, Bld: 91 mg/dL (ref 65–99)
POTASSIUM: 4 mmol/L (ref 3.5–5.1)
Sodium: 136 mmol/L (ref 135–145)
TOTAL PROTEIN: 6.5 g/dL (ref 6.5–8.1)

## 2017-04-24 LAB — CBC
HEMATOCRIT: 33.3 % — AB (ref 36.0–46.0)
HEMOGLOBIN: 10.2 g/dL — AB (ref 12.0–15.0)
MCH: 27.1 pg (ref 26.0–34.0)
MCHC: 30.6 g/dL (ref 30.0–36.0)
MCV: 88.6 fL (ref 78.0–100.0)
Platelets: 350 10*3/uL (ref 150–400)
RBC: 3.76 MIL/uL — ABNORMAL LOW (ref 3.87–5.11)
RDW: 15.5 % (ref 11.5–15.5)
WBC: 9.4 10*3/uL (ref 4.0–10.5)

## 2017-04-24 LAB — TSH: TSH: 2.096 u[IU]/mL (ref 0.350–4.500)

## 2017-04-24 MED ORDER — AMLODIPINE BESYLATE 5 MG PO TABS: 2.5000 mg | ORAL_TABLET | Freq: Every day | ORAL | 2 refills | Status: DC
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
## 2017-04-24 NOTE — Telephone Encounter (Signed)
Kristen Garrison is calling because she is having problems with her her being out of rhythm . Please call

## 2017-04-24 NOTE — Patient Instructions (Signed)
Your physician has recommended you make the following change in your medication:  1)Decrease Amlodipine to 2.5mg  once a day (1/2 tablet of your 5mg  tablet)

## 2017-04-24 NOTE — Telephone Encounter (Signed)
Patient seeing A. FIB clinic today.

## 2017-04-28 DIAGNOSIS — I10 Essential (primary) hypertension: Secondary | ICD-10-CM | POA: Diagnosis not present

## 2017-04-28 DIAGNOSIS — R799 Abnormal finding of blood chemistry, unspecified: Secondary | ICD-10-CM | POA: Diagnosis not present

## 2017-04-28 DIAGNOSIS — K279 Peptic ulcer, site unspecified, unspecified as acute or chronic, without hemorrhage or perforation: Secondary | ICD-10-CM | POA: Diagnosis not present

## 2017-04-28 DIAGNOSIS — I4891 Unspecified atrial fibrillation: Secondary | ICD-10-CM | POA: Diagnosis not present

## 2017-04-28 DIAGNOSIS — R69 Illness, unspecified: Secondary | ICD-10-CM | POA: Diagnosis not present

## 2017-04-28 DIAGNOSIS — G459 Transient cerebral ischemic attack, unspecified: Secondary | ICD-10-CM | POA: Diagnosis not present

## 2017-05-09 ENCOUNTER — Other Ambulatory Visit: Payer: Self-pay | Admitting: Internal Medicine

## 2017-05-30 DIAGNOSIS — R350 Frequency of micturition: Secondary | ICD-10-CM | POA: Diagnosis not present

## 2017-05-30 DIAGNOSIS — R69 Illness, unspecified: Secondary | ICD-10-CM | POA: Diagnosis not present

## 2017-06-04 ENCOUNTER — Ambulatory Visit (INDEPENDENT_AMBULATORY_CARE_PROVIDER_SITE_OTHER): Payer: Self-pay | Admitting: *Deleted

## 2017-06-04 ENCOUNTER — Encounter (INDEPENDENT_AMBULATORY_CARE_PROVIDER_SITE_OTHER): Payer: Self-pay

## 2017-06-04 DIAGNOSIS — R55 Syncope and collapse: Secondary | ICD-10-CM

## 2017-06-04 LAB — CUP PACEART INCLINIC DEVICE CHECK
Implantable Pulse Generator Implant Date: 20150429
MDC IDC SESS DTM: 20181205173512

## 2017-06-04 NOTE — Progress Notes (Signed)
Loop check in clinic. Battery status: EOS 02/09/2017. R-waves 0.53m. 0 symptom episodes, 4 tachy episodes, 0 pause episodes, 0 brady episodes. 4 AF episodes. Patient states that she would like to leave her ILR in at this time. Return kit ordered.

## 2017-06-12 DIAGNOSIS — G9009 Other idiopathic peripheral autonomic neuropathy: Secondary | ICD-10-CM | POA: Diagnosis not present

## 2017-06-12 DIAGNOSIS — R7982 Elevated C-reactive protein (CRP): Secondary | ICD-10-CM | POA: Diagnosis not present

## 2017-06-12 DIAGNOSIS — I10 Essential (primary) hypertension: Secondary | ICD-10-CM | POA: Diagnosis not present

## 2017-06-20 ENCOUNTER — Emergency Department (HOSPITAL_COMMUNITY): Payer: Medicare HMO

## 2017-06-20 ENCOUNTER — Emergency Department (HOSPITAL_COMMUNITY)
Admission: EM | Admit: 2017-06-20 | Discharge: 2017-06-21 | Disposition: A | Discharge status: Home / Self Care | Payer: Medicare HMO | Attending: Emergency Medicine | Admitting: Emergency Medicine

## 2017-06-20 ENCOUNTER — Encounter (HOSPITAL_COMMUNITY): Payer: Self-pay

## 2017-06-20 DIAGNOSIS — S59911A Unspecified injury of right forearm, initial encounter: Secondary | ICD-10-CM | POA: Diagnosis not present

## 2017-06-20 DIAGNOSIS — W010XXA Fall on same level from slipping, tripping and stumbling without subsequent striking against object, initial encounter: Secondary | ICD-10-CM | POA: Diagnosis not present

## 2017-06-20 DIAGNOSIS — Y999 Unspecified external cause status: Secondary | ICD-10-CM | POA: Insufficient documentation

## 2017-06-20 DIAGNOSIS — I10 Essential (primary) hypertension: Secondary | ICD-10-CM | POA: Diagnosis not present

## 2017-06-20 DIAGNOSIS — Z7901 Long term (current) use of anticoagulants: Secondary | ICD-10-CM | POA: Diagnosis not present

## 2017-06-20 DIAGNOSIS — S6291XA Unspecified fracture of right wrist and hand, initial encounter for closed fracture: Secondary | ICD-10-CM | POA: Diagnosis not present

## 2017-06-20 DIAGNOSIS — T148XXA Other injury of unspecified body region, initial encounter: Secondary | ICD-10-CM | POA: Diagnosis not present

## 2017-06-20 DIAGNOSIS — S62101A Fracture of unspecified carpal bone, right wrist, initial encounter for closed fracture: Secondary | ICD-10-CM

## 2017-06-20 DIAGNOSIS — M25531 Pain in right wrist: Secondary | ICD-10-CM | POA: Diagnosis not present

## 2017-06-20 DIAGNOSIS — Y929 Unspecified place or not applicable: Secondary | ICD-10-CM | POA: Insufficient documentation

## 2017-06-20 DIAGNOSIS — Y939 Activity, unspecified: Secondary | ICD-10-CM | POA: Insufficient documentation

## 2017-06-20 DIAGNOSIS — S6991XA Unspecified injury of right wrist, hand and finger(s), initial encounter: Secondary | ICD-10-CM | POA: Diagnosis present

## 2017-06-20 DIAGNOSIS — S52571A Other intraarticular fracture of lower end of right radius, initial encounter for closed fracture: Secondary | ICD-10-CM | POA: Insufficient documentation

## 2017-06-20 DIAGNOSIS — S52501A Unspecified fracture of the lower end of right radius, initial encounter for closed fracture: Secondary | ICD-10-CM | POA: Diagnosis not present

## 2017-06-20 DIAGNOSIS — Z79899 Other long term (current) drug therapy: Secondary | ICD-10-CM | POA: Diagnosis not present

## 2017-06-20 LAB — CBC WITH DIFFERENTIAL/PLATELET
Basophils Absolute: 0 10*3/uL (ref 0.0–0.1)
Basophils Relative: 0 %
EOS PCT: 0 %
Eosinophils Absolute: 0 10*3/uL (ref 0.0–0.7)
HCT: 34.2 % — ABNORMAL LOW (ref 36.0–46.0)
Hemoglobin: 10.8 g/dL — ABNORMAL LOW (ref 12.0–15.0)
LYMPHS ABS: 3.8 10*3/uL (ref 0.7–4.0)
LYMPHS PCT: 31 %
MCH: 27 pg (ref 26.0–34.0)
MCHC: 31.6 g/dL (ref 30.0–36.0)
MCV: 85.5 fL (ref 78.0–100.0)
MONO ABS: 0.8 10*3/uL (ref 0.1–1.0)
Monocytes Relative: 7 %
Neutro Abs: 7.7 10*3/uL (ref 1.7–7.7)
Neutrophils Relative %: 62 %
PLATELETS: 326 10*3/uL (ref 150–400)
RBC: 4 MIL/uL (ref 3.87–5.11)
RDW: 15.2 % (ref 11.5–15.5)
WBC: 12.4 10*3/uL — AB (ref 4.0–10.5)

## 2017-06-20 LAB — BASIC METABOLIC PANEL
Anion gap: 9 (ref 5–15)
BUN: 33 mg/dL — ABNORMAL HIGH (ref 6–20)
CHLORIDE: 102 mmol/L (ref 101–111)
CO2: 25 mmol/L (ref 22–32)
CREATININE: 1.3 mg/dL — AB (ref 0.44–1.00)
Calcium: 8.8 mg/dL — ABNORMAL LOW (ref 8.9–10.3)
GFR calc non Af Amer: 36 mL/min — ABNORMAL LOW (ref >60)
GFR, EST AFRICAN AMERICAN: 41 mL/min — AB (ref >60)
Glucose, Bld: 97 mg/dL (ref 65–99)
POTASSIUM: 4.3 mmol/L (ref 3.5–5.1)
Sodium: 136 mmol/L (ref 135–145)

## 2017-06-20 LAB — PROTIME-INR
INR: 1.45
Prothrombin Time: 17.5 seconds — ABNORMAL HIGH (ref 11.4–15.2)

## 2017-06-20 MED ORDER — FENTANYL CITRATE (PF) 100 MCG/2ML IJ SOLN
50.0000 ug | Freq: Once | INTRAMUSCULAR | Status: AC | PRN
  Administered 2017-06-20: 50 ug via INTRAVENOUS
  Filled 2017-06-20: qty 2

## 2017-06-20 MED ORDER — FENTANYL CITRATE (PF) 100 MCG/2ML IJ SOLN
25.0000 ug | Freq: Once | INTRAMUSCULAR | Status: AC
  Administered 2017-06-20: 25 ug via INTRAVENOUS
  Filled 2017-06-20: qty 2
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
## 2017-06-20 NOTE — ED Triage Notes (Signed)
Pt from home via EMS after a fall. Pt reports she tripped and fell landing on her R side. Pt only complaint is R wrist pain 6/10. Deformity and large hematoma noted to R wrist. Sensation and mobility intact. Pulse 2+, cap refill < 3 seconds. Pt reports tingling in her R pinky and R ring finger. Pt on xarelto. Denies LOC or head injury. A&Ox4. 140/86, HR 86, 16 RR.

## 2017-06-20 NOTE — ED Notes (Signed)
Delay in lab draw,  Pt not in room 

## 2017-06-20 NOTE — Progress Notes (Signed)
Orthopedic Tech Progress Note Patient Details:  Kristen Garrison June 04, 1927 [id number]  Ortho Devices Type of Ortho Device: Finger trap, Sugartong splint, Arm sling Finger Trap Weight: 10Lb Ortho Device/Splint Location: Right ARm Ortho Device/Splint Interventions: Application, Adjustment   Post Interventions Patient Tolerated: Well Instructions Provided: Care of device, Adjustment of device   Alvina ChouWilliams, [name] 06/20/2017, 11:47 PM

## 2017-06-20 NOTE — ED Notes (Signed)
Pt reports recently having multiple falls, reports she believes it is due to her neuropathy. Pt states, "I never feel dizzy or woozy."

## 2017-06-21 DIAGNOSIS — S6291XA Unspecified fracture of right wrist and hand, initial encounter for closed fracture: Secondary | ICD-10-CM | POA: Diagnosis not present

## 2017-06-21 DIAGNOSIS — W010XXA Fall on same level from slipping, tripping and stumbling without subsequent striking against object, initial encounter: Secondary | ICD-10-CM | POA: Diagnosis not present

## 2017-06-21 DIAGNOSIS — Z79899 Other long term (current) drug therapy: Secondary | ICD-10-CM | POA: Diagnosis not present

## 2017-06-21 DIAGNOSIS — S52571A Other intraarticular fracture of lower end of right radius, initial encounter for closed fracture: Secondary | ICD-10-CM | POA: Diagnosis not present

## 2017-06-21 DIAGNOSIS — I10 Essential (primary) hypertension: Secondary | ICD-10-CM | POA: Diagnosis not present

## 2017-06-21 DIAGNOSIS — Z7901 Long term (current) use of anticoagulants: Secondary | ICD-10-CM | POA: Diagnosis not present

## 2017-06-21 MED ORDER — HYDROCODONE-ACETAMINOPHEN 5-325 MG PO TABS
1.0000 | ORAL_TABLET | Freq: Once | ORAL | Status: AC
  Administered 2017-06-21: 1 via ORAL
  Filled 2017-06-21: qty 1

## 2017-06-21 MED ORDER — HYDROCODONE-ACETAMINOPHEN 5-325 MG PO TABS: 1.0000 | ORAL_TABLET | Freq: Four times a day (QID) | ORAL | 0 refills | Status: DC | PRN

## 2017-06-21 NOTE — Discharge Instructions (Addendum)
Please read instructions below. Keep your splint on at all times, and do not get it wet.  Do not put any weight on your right arm. You can take hydrocodone every 6 hours as needed for severe pain.  Do not take Tylenol, drive, drink alcohol while taking this medication.  You can take Advil/ibuprofen every 6 hours as needed with this medication or by itself. Tender appointment with Dr. Janee Mornhompson on Thursday, 06/26/2017 at 9 AM.  He asks that you do not take your Xarelto on Wednesday, 06/25/2017. Return to the ER for severely worsening pain in your wrist, cold/blue or numb fingers, or new or concerning symptoms.

## 2017-06-26 DIAGNOSIS — M25531 Pain in right wrist: Secondary | ICD-10-CM | POA: Diagnosis not present

## 2017-09-07 ENCOUNTER — Encounter (HOSPITAL_COMMUNITY): Payer: Self-pay

## 2017-09-07 ENCOUNTER — Other Ambulatory Visit: Payer: Self-pay

## 2017-09-07 ENCOUNTER — Emergency Department (HOSPITAL_COMMUNITY): Payer: Medicare HMO

## 2017-09-07 ENCOUNTER — Inpatient Hospital Stay (HOSPITAL_COMMUNITY)
Admission: EM | Admit: 2017-09-07 | Discharge: 2017-09-10 | DRG: 378 | Disposition: A | Discharge status: Home / Self Care | Payer: Medicare HMO | Attending: Internal Medicine | Admitting: Internal Medicine

## 2017-09-07 DIAGNOSIS — I959 Hypotension, unspecified: Secondary | ICD-10-CM | POA: Diagnosis not present

## 2017-09-07 DIAGNOSIS — R195 Other fecal abnormalities: Secondary | ICD-10-CM | POA: Diagnosis not present

## 2017-09-07 DIAGNOSIS — M1990 Unspecified osteoarthritis, unspecified site: Secondary | ICD-10-CM | POA: Diagnosis present

## 2017-09-07 DIAGNOSIS — D62 Acute posthemorrhagic anemia: Secondary | ICD-10-CM | POA: Diagnosis present

## 2017-09-07 DIAGNOSIS — G8929 Other chronic pain: Secondary | ICD-10-CM | POA: Diagnosis present

## 2017-09-07 DIAGNOSIS — D649 Anemia, unspecified: Secondary | ICD-10-CM | POA: Diagnosis present

## 2017-09-07 DIAGNOSIS — F419 Anxiety disorder, unspecified: Secondary | ICD-10-CM | POA: Diagnosis present

## 2017-09-07 DIAGNOSIS — Z9981 Dependence on supplemental oxygen: Secondary | ICD-10-CM | POA: Diagnosis not present

## 2017-09-07 DIAGNOSIS — K219 Gastro-esophageal reflux disease without esophagitis: Secondary | ICD-10-CM | POA: Diagnosis present

## 2017-09-07 DIAGNOSIS — K449 Diaphragmatic hernia without obstruction or gangrene: Secondary | ICD-10-CM | POA: Diagnosis present

## 2017-09-07 DIAGNOSIS — Z961 Presence of intraocular lens: Secondary | ICD-10-CM | POA: Diagnosis present

## 2017-09-07 DIAGNOSIS — I440 Atrioventricular block, first degree: Secondary | ICD-10-CM | POA: Diagnosis present

## 2017-09-07 DIAGNOSIS — Z79899 Other long term (current) drug therapy: Secondary | ICD-10-CM | POA: Diagnosis not present

## 2017-09-07 DIAGNOSIS — K222 Esophageal obstruction: Secondary | ICD-10-CM | POA: Diagnosis present

## 2017-09-07 DIAGNOSIS — I481 Persistent atrial fibrillation: Secondary | ICD-10-CM | POA: Diagnosis not present

## 2017-09-07 DIAGNOSIS — I480 Paroxysmal atrial fibrillation: Secondary | ICD-10-CM | POA: Diagnosis present

## 2017-09-07 DIAGNOSIS — Z9842 Cataract extraction status, left eye: Secondary | ICD-10-CM | POA: Diagnosis not present

## 2017-09-07 DIAGNOSIS — R29898 Other symptoms and signs involving the musculoskeletal system: Secondary | ICD-10-CM | POA: Diagnosis not present

## 2017-09-07 DIAGNOSIS — R531 Weakness: Secondary | ICD-10-CM | POA: Diagnosis not present

## 2017-09-07 DIAGNOSIS — Z7901 Long term (current) use of anticoagulants: Secondary | ICD-10-CM | POA: Diagnosis not present

## 2017-09-07 DIAGNOSIS — I10 Essential (primary) hypertension: Secondary | ICD-10-CM | POA: Diagnosis present

## 2017-09-07 DIAGNOSIS — N179 Acute kidney failure, unspecified: Secondary | ICD-10-CM | POA: Diagnosis present

## 2017-09-07 DIAGNOSIS — Z9841 Cataract extraction status, right eye: Secondary | ICD-10-CM

## 2017-09-07 DIAGNOSIS — I4891 Unspecified atrial fibrillation: Secondary | ICD-10-CM | POA: Diagnosis present

## 2017-09-07 DIAGNOSIS — K921 Melena: Secondary | ICD-10-CM | POA: Diagnosis present

## 2017-09-07 DIAGNOSIS — E7800 Pure hypercholesterolemia, unspecified: Secondary | ICD-10-CM | POA: Diagnosis present

## 2017-09-07 DIAGNOSIS — Z96651 Presence of right artificial knee joint: Secondary | ICD-10-CM | POA: Diagnosis present

## 2017-09-07 DIAGNOSIS — F329 Major depressive disorder, single episode, unspecified: Secondary | ICD-10-CM | POA: Diagnosis present

## 2017-09-07 DIAGNOSIS — D500 Iron deficiency anemia secondary to blood loss (chronic): Secondary | ICD-10-CM | POA: Diagnosis present

## 2017-09-07 LAB — CBC
HCT: 27.2 % — ABNORMAL LOW (ref 36.0–46.0)
Hemoglobin: 8.1 g/dL — ABNORMAL LOW (ref 12.0–15.0)
MCH: 25.2 pg — ABNORMAL LOW (ref 26.0–34.0)
MCHC: 29.8 g/dL — ABNORMAL LOW (ref 30.0–36.0)
MCV: 84.7 fL (ref 78.0–100.0)
PLATELETS: 460 10*3/uL — AB (ref 150–400)
RBC: 3.21 MIL/uL — ABNORMAL LOW (ref 3.87–5.11)
RDW: 16.4 % — AB (ref 11.5–15.5)
WBC: 9.8 10*3/uL (ref 4.0–10.5)

## 2017-09-07 LAB — BASIC METABOLIC PANEL
Anion gap: 10 (ref 5–15)
BUN: 41 mg/dL — AB (ref 6–20)
CO2: 27 mmol/L (ref 22–32)
CREATININE: 1.48 mg/dL — AB (ref 0.44–1.00)
Calcium: 8.3 mg/dL — ABNORMAL LOW (ref 8.9–10.3)
Chloride: 101 mmol/L (ref 101–111)
GFR, EST AFRICAN AMERICAN: 35 mL/min — AB (ref >60)
GFR, EST NON AFRICAN AMERICAN: 30 mL/min — AB (ref >60)
Glucose, Bld: 116 mg/dL — ABNORMAL HIGH (ref 65–99)
Potassium: 4.1 mmol/L (ref 3.5–5.1)
SODIUM: 138 mmol/L (ref 135–145)

## 2017-09-07 LAB — URINALYSIS, ROUTINE W REFLEX MICROSCOPIC
Bilirubin Urine: NEGATIVE
Glucose, UA: NEGATIVE mg/dL
Hgb urine dipstick: NEGATIVE
KETONES UR: NEGATIVE mg/dL
LEUKOCYTES UA: NEGATIVE
NITRITE: NEGATIVE
PROTEIN: NEGATIVE mg/dL
Specific Gravity, Urine: 1.006 (ref 1.005–1.030)
pH: 6 (ref 5.0–8.0)

## 2017-09-07 LAB — TROPONIN I: Troponin I: <0.03 ng/mL (ref <0.03)

## 2017-09-07 LAB — HEPATIC FUNCTION PANEL
ALBUMIN: 2.7 g/dL — AB (ref 3.5–5.0)
ALT: 18 U/L (ref 14–54)
AST: 23 U/L (ref 15–41)
Alkaline Phosphatase: 65 U/L (ref 38–126)
Bilirubin, Direct: 0.1 mg/dL (ref 0.1–0.5)
Indirect Bilirubin: 0.5 mg/dL (ref 0.3–0.9)
TOTAL PROTEIN: 5.9 g/dL — AB (ref 6.5–8.1)
Total Bilirubin: 0.6 mg/dL (ref 0.3–1.2)

## 2017-09-07 LAB — POC OCCULT BLOOD, ED: FECAL OCCULT BLD: POSITIVE — AB

## 2017-09-07 MED ORDER — TRAZODONE HCL 50 MG PO TABS
50.0000 mg | ORAL_TABLET | Freq: Every day | ORAL | Status: DC
  Administered 2017-09-08 – 2017-09-09 (×3): 50 mg via ORAL
  Filled 2017-09-07 (×3): qty 1

## 2017-09-07 MED ORDER — PREDNISONE 5 MG PO TABS
15.0000 mg | ORAL_TABLET | Freq: Two times a day (BID) | ORAL | Status: DC
  Administered 2017-09-08 – 2017-09-10 (×5): 15 mg via ORAL
  Filled 2017-09-07 (×6): qty 1

## 2017-09-07 MED ORDER — CLORAZEPATE DIPOTASSIUM 7.5 MG PO TABS
3.7500 mg | ORAL_TABLET | Freq: Three times a day (TID) | ORAL | Status: DC
  Administered 2017-09-08 – 2017-09-10 (×7): 3.75 mg via ORAL
  Filled 2017-09-07 (×7): qty 1

## 2017-09-07 MED ORDER — VENLAFAXINE HCL ER 75 MG PO CP24
75.0000 mg | ORAL_CAPSULE | Freq: Every day | ORAL | Status: DC
  Administered 2017-09-08 – 2017-09-10 (×3): 75 mg via ORAL
  Filled 2017-09-07 (×3): qty 1

## 2017-09-07 MED ORDER — PANTOPRAZOLE SODIUM 40 MG IV SOLR
40.0000 mg | Freq: Once | INTRAVENOUS | Status: AC
  Administered 2017-09-07: 40 mg via INTRAVENOUS
  Filled 2017-09-07: qty 40

## 2017-09-07 MED ORDER — ACETAMINOPHEN 650 MG RE SUPP: 650.0000 mg | Freq: Four times a day (QID) | RECTAL | Status: DC | PRN

## 2017-09-07 MED ORDER — SODIUM CHLORIDE 0.9 % IV SOLN
8.0000 mg/h | INTRAVENOUS | Status: DC
  Administered 2017-09-08 – 2017-09-10 (×4): 8 mg/h via INTRAVENOUS
  Filled 2017-09-07 (×9): qty 80

## 2017-09-07 MED ORDER — ACETAMINOPHEN 325 MG PO TABS: 650.0000 mg | ORAL_TABLET | Freq: Four times a day (QID) | ORAL | Status: DC | PRN

## 2017-09-07 MED ORDER — SODIUM CHLORIDE 0.9 % IV SOLN
INTRAVENOUS | Status: AC
  Administered 2017-09-07 – 2017-09-08 (×2): via INTRAVENOUS

## 2017-09-07 MED ORDER — SODIUM CHLORIDE 0.9 % IV BOLUS (SEPSIS)
500.0000 mL | Freq: Once | INTRAVENOUS | Status: AC
  Administered 2017-09-07: 500 mL via INTRAVENOUS

## 2017-09-07 NOTE — ED Notes (Signed)
Bed: [id number] Expected date:  Expected time:  Means of arrival:  Comments: Rm 10 EMS

## 2017-09-07 NOTE — ED Notes (Signed)
Assisted pt with bedpan- pt did not urinate in bedpan but had incontinence episode before writer came into room. Performed peri care and changed linens.
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
## 2017-09-07 NOTE — ED Triage Notes (Signed)
Pt from home BIB EMS c/o weakness and dizziness starting today. Fire dept attempted to assist pt ambulate, when she experienced a near syncopal episode. Hypotensive with EMS. Diarrhea x1 wk. Pt reports she was just weak and unable to stand up today. Denies chest pain or shortness of breath at this time. 
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
## 2017-09-08 DIAGNOSIS — I10 Essential (primary) hypertension: Secondary | ICD-10-CM

## 2017-09-08 DIAGNOSIS — I480 Paroxysmal atrial fibrillation: Secondary | ICD-10-CM

## 2017-09-08 DIAGNOSIS — R195 Other fecal abnormalities: Secondary | ICD-10-CM

## 2017-09-08 DIAGNOSIS — R531 Weakness: Secondary | ICD-10-CM

## 2017-09-08 DIAGNOSIS — K921 Melena: Principal | ICD-10-CM

## 2017-09-08 DIAGNOSIS — K219 Gastro-esophageal reflux disease without esophagitis: Secondary | ICD-10-CM

## 2017-09-08 DIAGNOSIS — D649 Anemia, unspecified: Secondary | ICD-10-CM

## 2017-09-08 LAB — FERRITIN: FERRITIN: 23 ng/mL (ref 11–307)

## 2017-09-08 LAB — CBC
HEMATOCRIT: 26.2 % — AB (ref 36.0–46.0)
HEMATOCRIT: 26.2 % — AB (ref 36.0–46.0)
HEMOGLOBIN: 8 g/dL — AB (ref 12.0–15.0)
Hemoglobin: 8 g/dL — ABNORMAL LOW (ref 12.0–15.0)
MCH: 25.6 pg — ABNORMAL LOW (ref 26.0–34.0)
MCH: 25.6 pg — AB (ref 26.0–34.0)
MCHC: 30.5 g/dL (ref 30.0–36.0)
MCHC: 30.5 g/dL (ref 30.0–36.0)
MCV: 83.7 fL (ref 78.0–100.0)
MCV: 83.7 fL (ref 78.0–100.0)
Platelets: 472 10*3/uL — ABNORMAL HIGH (ref 150–400)
Platelets: 472 10*3/uL — ABNORMAL HIGH (ref 150–400)
RBC: 3.13 MIL/uL — ABNORMAL LOW (ref 3.87–5.11)
RBC: 3.13 MIL/uL — ABNORMAL LOW (ref 3.87–5.11)
RDW: 16.5 % — AB (ref 11.5–15.5)
RDW: 16.5 % — AB (ref 11.5–15.5)
WBC: 8.2 10*3/uL (ref 4.0–10.5)
WBC: 8.2 10*3/uL (ref 4.0–10.5)

## 2017-09-08 LAB — HEMOGLOBIN AND HEMATOCRIT, BLOOD
HCT: 30.2 % — ABNORMAL LOW (ref 36.0–46.0)
Hemoglobin: 8.9 g/dL — ABNORMAL LOW (ref 12.0–15.0)

## 2017-09-08 LAB — TROPONIN I

## 2017-09-08 LAB — COMPREHENSIVE METABOLIC PANEL
ALT: 18 U/L (ref 14–54)
ANION GAP: 10 (ref 5–15)
AST: 17 U/L (ref 15–41)
Albumin: 2.9 g/dL — ABNORMAL LOW (ref 3.5–5.0)
Alkaline Phosphatase: 66 U/L (ref 38–126)
BILIRUBIN TOTAL: 0.5 mg/dL (ref 0.3–1.2)
BUN: 34 mg/dL — ABNORMAL HIGH (ref 6–20)
CO2: 28 mmol/L (ref 22–32)
Calcium: 8.5 mg/dL — ABNORMAL LOW (ref 8.9–10.3)
Chloride: 102 mmol/L (ref 101–111)
Creatinine, Ser: 1.25 mg/dL — ABNORMAL HIGH (ref 0.44–1.00)
GFR calc non Af Amer: 37 mL/min — ABNORMAL LOW (ref >60)
GFR, EST AFRICAN AMERICAN: 43 mL/min — AB (ref >60)
Glucose, Bld: 108 mg/dL — ABNORMAL HIGH (ref 65–99)
POTASSIUM: 3.9 mmol/L (ref 3.5–5.1)
Sodium: 140 mmol/L (ref 135–145)
TOTAL PROTEIN: 6.1 g/dL — AB (ref 6.5–8.1)

## 2017-09-08 LAB — IRON AND TIBC
IRON: 15 ug/dL — AB (ref 28–170)
Saturation Ratios: 5 % — ABNORMAL LOW (ref 10.4–31.8)
TIBC: 325 ug/dL (ref 250–450)
UIBC: 310 ug/dL

## 2017-09-08 LAB — PREPARE RBC (CROSSMATCH)

## 2017-09-08 LAB — CORTISOL: CORTISOL PLASMA: 2.4 ug/dL

## 2017-09-08 LAB — VITAMIN B12: Vitamin B-12: 817 pg/mL (ref 180–914)

## 2017-09-08 LAB — ABO/RH: ABO/RH(D): O NEG

## 2017-09-08 MED ORDER — DILTIAZEM HCL 30 MG PO TABS
30.0000 mg | ORAL_TABLET | Freq: Three times a day (TID) | ORAL | Status: DC
  Administered 2017-09-08 – 2017-09-10 (×7): 30 mg via ORAL
  Filled 2017-09-08 (×7): qty 1

## 2017-09-08 MED ORDER — SODIUM CHLORIDE 0.9 % IV SOLN
Freq: Once | INTRAVENOUS | Status: AC
  Administered 2017-09-08: 13:00:00 via INTRAVENOUS
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
## 2017-09-08 NOTE — Progress Notes (Signed)
PROGRESS NOTE    Jenese A Creps  RUE:[id number] DOB: 11-23-1926 DOA: 09/07/2017 PCP: Nila NephewGreen, Edwin, MD    Brief Narrative:   Kristen Garrison  is a 84 y.o. female, w hypertension, hyperlipidemia, P afib, Gerd, presents w c/o black stool for the past 1.5 weeks and generalized weakness.    she was admitted for evaluation of GI bleed. She was kept NPO and GI consulted and plan for EGD in am.   Assessment & Plan:   Active Problems:   Esophageal reflux   Atrial fibrillation (HCC)   HTN (hypertension)   Adequate anticoagulation on anticoagulant therapy   Weakness of both lower extremities   Anemia  Black tarry stools/ suspect upper GI bleed/ vs possibly from Xarelto use:  Admitted , 2 units of prbc transfusion ordered by GI.  Plan for EGD IN AM.  Hemoglobin around 8, baseline appears to be around 10.  Transfuse to keep hemoglobin greater than 8.    Paroxysmal atrial fibrillation:  Rate not well controlled. cardizem added on.  xarelto on hold for GI bleed.    Hypertension:  Well controlled today.    GERD:  On PPI.   Acute anemia from blood loss superimposed on chronic anemia  Baseline hemoglobin around 10, currently hemoglobin around 8 and 2 units of prbc transfusion ordered .  Recheck H&H in am.      DVT prophylaxis: scd's Code Status: full code.  Family Communication: none at  Bedside.  Disposition Plan: pending egd.    Consultants:   Gastroenterology Dr Marina GoodellPerry.    Procedures:EGD scheduled in am.    Antimicrobials: none.    Subjective: No new complaints. Wants to know when she can eat something.   Objective: Vitals:   09/08/17 1540 09/08/17 1620 09/08/17 1635 09/08/17 1830  BP: (!) 126/99 99/64 131/74 118/87  Pulse: (!) 105 93 87 75  Resp: 18 18 18 18   Temp: 99.9 F (37.7 C) 99.3 F (37.4 C) 98.9 F (37.2 C) 99.5 F (37.5 C)  TempSrc: Oral Oral Oral Oral  SpO2: 91%  96% 95%  Weight:      Height:        Intake/Output Summary (Last 24 hours)  at 09/08/2017 1940 Last data filed at 09/08/2017 1830 Gross per 24 hour  Intake 1574.83 ml  Output 750 ml  Net 824.83 ml   Filed Weights   09/07/17 1614 09/07/17 2341 09/08/17 0604  Weight: 77.1 kg (170 lb) 77.1 kg (169 lb 15.6 oz) 77.4 kg (170 lb 10.2 oz)    Examination:  General exam: Appears calm and comfortable  Respiratory system: Clear to auscultation. Respiratory effort normal. Cardiovascular system: S1 & S2 heard, irregular, tachycardic. No JVD, murmurs,  No pedal edema. Gastrointestinal system: Abdomen is nondistended, soft and nontender. No organomegaly or masses felt. Normal bowel sounds heard. Central nervous system: Alert and oriented. No focal neurological deficits. Extremities: Symmetric 5 x 5 power. Skin: No rashes, lesions or ulcers Psychiatry: Judgement and insight appear normal. Mood & affect appropriate.     Data Reviewed: I have personally reviewed following labs and imaging studies  CBC: Recent Labs  Lab 09/07/17 1631 09/08/17 0013 09/08/17 0016 09/08/17 1214  WBC 9.8 8.2 8.2  --   HGB 8.1* 8.0* 8.0* 8.9*  HCT 27.2* 26.2* 26.2* 30.2*  MCV 84.7 83.7 83.7  --   PLT 460* 472* 472*  --    Basic Metabolic Panel: Recent Labs  Lab 09/07/17 1631 09/08/17 0013  NA 138 140  K  4.1 3.9  CL 101 102  CO2 27 28  GLUCOSE 116* 108*  BUN 41* 34*  CREATININE 1.48* 1.25*  CALCIUM 8.3* 8.5*   GFR: Estimated Creatinine Clearance: 31.3 mL/min (A) (by C-G formula based on SCr of 1.25 mg/dL (H)). Liver Function Tests: Recent Labs  Lab 09/07/17 1647 09/08/17 0013  AST 23 17  ALT 18 18  ALKPHOS 65 66  BILITOT 0.6 0.5  PROT 5.9* 6.1*  ALBUMIN 2.7* 2.9*   No results for input(s): LIPASE, AMYLASE in the last 168 hours. No results for input(s): AMMONIA in the last 168 hours. Coagulation Profile: No results for input(s): INR, PROTIME in the last 168 hours. Cardiac Enzymes: Recent Labs  Lab 09/07/17 1647 09/08/17 0013 09/08/17 0016 09/08/17 1053    TROPONINI <0.03 <0.03 <0.03 <0.03   BNP (last 3 results) No results for input(s): PROBNP in the last 8760 hours. HbA1C: No results for input(s): HGBA1C in the last 72 hours. CBG: No results for input(s): GLUCAP in the last 168 hours. Lipid Profile: No results for input(s): CHOL, HDL, LDLCALC, TRIG, CHOLHDL, LDLDIRECT in the last 72 hours. Thyroid Function Tests: No results for input(s): TSH, T4TOTAL, FREET4, T3FREE, THYROIDAB in the last 72 hours. Anemia Panel: Recent Labs    09/08/17 0503  VITAMINB12 817  FERRITIN 23  TIBC 325  IRON 15*   Sepsis Labs: No results for input(s): PROCALCITON, LATICACIDVEN in the last 168 hours.  No results found for this or any previous visit (from the past 240 hour(s)).       Radiology Studies: Dg Chest 2 View  Result Date: 09/07/2017 CLINICAL DATA:  Weakness and dizziness EXAM: CHEST - 2 VIEW COMPARISON:  03/29/2016 FINDINGS: Cardiac shadow is mildly enlarged. Loop recorder is again noted. Patient is rotated to the right accentuating the mediastinal markings. Mild basilar scarring is noted stable from the previous study. No sizable effusion or focal infiltrate is noted. Degenerative change of the thoracic spine is seen. IMPRESSION: No acute intrathoracic abnormality noted. Stable bibasilar scarring. Electronically Signed   By: Alcide Clever M.D.   On: 09/07/2017 17:22   Dg Lumbar Spine Complete  Result Date: 09/07/2017 CLINICAL DATA:  Low back pain, no known injury, initial encounter EXAM: LUMBAR SPINE - COMPLETE 4+ VIEW COMPARISON:  03/27/2017 FINDINGS: Postsurgical changes are noted at L4-5. Multilevel disc space narrowing is seen. Compression deformities are seen involving L1, L2, L3 and L4. The overall appearance is stable with the exception of some progression of the superior endplate deformity at L3 when compared with the prior exam. Facet hypertrophic changes are noted. No pars defects are seen. No soft tissue abnormality is noted.  IMPRESSION: Degenerative and postoperative changes. Multiple compression deformities as described. Electronically Signed   By: Alcide Clever M.D.   On: 09/07/2017 17:24        Scheduled Meds: . clorazepate  3.75 mg Oral TID  . diltiazem  30 mg Oral Q8H  . predniSONE  15 mg Oral BID  . traZODone  50 mg Oral QHS  . venlafaxine XR  75 mg Oral Q breakfast   Continuous Infusions: . pantoprozole (PROTONIX) infusion 8 mg/hr (09/08/17 0932)     LOS: 1 day    Time spent: 35 minutes.     Kathlen Mody, MD [facility] Pager [phone]  If 7PM-7AM, please contact night-coverage [email or web address] Password [facility] 09/08/2017, 7:40 PM

## 2017-09-09 ENCOUNTER — Inpatient Hospital Stay (HOSPITAL_COMMUNITY): Payer: Medicare HMO | Admitting: Registered Nurse

## 2017-09-09 ENCOUNTER — Encounter (HOSPITAL_COMMUNITY)
Admission: EM | Disposition: A | Discharge status: Home / Self Care | Payer: Self-pay | Source: Home / Self Care | Attending: Internal Medicine

## 2017-09-09 ENCOUNTER — Encounter (HOSPITAL_COMMUNITY): Payer: Self-pay | Admitting: *Deleted

## 2017-09-09 DIAGNOSIS — D500 Iron deficiency anemia secondary to blood loss (chronic): Secondary | ICD-10-CM

## 2017-09-09 DIAGNOSIS — I481 Persistent atrial fibrillation: Secondary | ICD-10-CM

## 2017-09-09 DIAGNOSIS — K222 Esophageal obstruction: Secondary | ICD-10-CM

## 2017-09-09 DIAGNOSIS — K449 Diaphragmatic hernia without obstruction or gangrene: Secondary | ICD-10-CM

## 2017-09-09 DIAGNOSIS — Z7901 Long term (current) use of anticoagulants: Secondary | ICD-10-CM

## 2017-09-09 HISTORY — PX: ESOPHAGOGASTRODUODENOSCOPY (EGD) WITH PROPOFOL: SHX5813

## 2017-09-09 LAB — HEMOGLOBIN AND HEMATOCRIT, BLOOD
HEMATOCRIT: 35.1 % — AB (ref 36.0–46.0)
Hemoglobin: 11 g/dL — ABNORMAL LOW (ref 12.0–15.0)

## 2017-09-09 LAB — PROTEIN ELECTROPHORESIS, SERUM
A/G Ratio: 1.1 (ref 0.7–1.7)
ALBUMIN ELP: 2.7 g/dL — AB (ref 2.9–4.4)
Alpha-1-Globulin: 0.2 g/dL (ref 0.0–0.4)
Alpha-2-Globulin: 0.8 g/dL (ref 0.4–1.0)
Beta Globulin: 0.9 g/dL (ref 0.7–1.3)
GAMMA GLOBULIN: 0.6 g/dL (ref 0.4–1.8)
GLOBULIN, TOTAL: 2.5 g/dL (ref 2.2–3.9)
M-Spike, %: 0.2 g/dL — ABNORMAL HIGH
Total Protein ELP: 5.2 g/dL — ABNORMAL LOW (ref 6.0–8.5)

## 2017-09-09 LAB — TYPE AND SCREEN
ABO/RH(D): O NEG
ANTIBODY SCREEN: NEGATIVE
UNIT DIVISION: 0
UNIT DIVISION: 0

## 2017-09-09 LAB — BPAM RBC
BLOOD PRODUCT EXPIRATION DATE: 201904062359
Blood Product Expiration Date: 201904032359
ISSUE DATE / TIME: 201903111604
ISSUE DATE / TIME: 201903111312
UNIT TYPE AND RH: 9500
Unit Type and Rh: 9500

## 2017-09-09 SURGERY — ESOPHAGOGASTRODUODENOSCOPY (EGD) WITH PROPOFOL
Anesthesia: Monitor Anesthesia Care

## 2017-09-09 MED ORDER — PEG-KCL-NACL-NASULF-NA ASC-C 100 G PO SOLR
0.5000 | Freq: Two times a day (BID) | ORAL | Status: AC
  Administered 2017-09-09 (×2): 100 g via ORAL
  Filled 2017-09-09: qty 1

## 2017-09-09 MED ORDER — PROPOFOL 10 MG/ML IV BOLUS
INTRAVENOUS | Status: AC
Start: 2017-09-09 — End: 2017-09-09
  Filled 2017-09-09: qty 40

## 2017-09-09 MED ORDER — SODIUM CHLORIDE 0.9 % IV SOLN
INTRAVENOUS | Status: DC | PRN
  Administered 2017-09-09: 09:00:00 via INTRAVENOUS

## 2017-09-09 MED ORDER — PROPOFOL 500 MG/50ML IV EMUL
INTRAVENOUS | Status: DC | PRN
  Administered 2017-09-09: 100 ug/kg/min via INTRAVENOUS

## 2017-09-09 MED ORDER — LIDOCAINE 2% (20 MG/ML) 5 ML SYRINGE
INTRAMUSCULAR | Status: DC | PRN
  Administered 2017-09-09: 80 mg via INTRAVENOUS

## 2017-09-09 MED ORDER — PROPOFOL 10 MG/ML IV BOLUS
INTRAVENOUS | Status: DC | PRN
  Administered 2017-09-09 (×2): 20 mg via INTRAVENOUS

## 2017-09-09 SURGICAL SUPPLY — 14 items

## 2017-09-09 NOTE — Anesthesia Preprocedure Evaluation (Addendum)
Anesthesia Evaluation  Patient identified by MRN, date of birth, ID band Patient awake    Reviewed: Allergy & Precautions, NPO status , Patient's Chart, lab work & pertinent test results  Airway Mallampati: IV  TM Distance: <3 FB Neck ROM: Limited    Dental no notable dental hx.    Pulmonary neg pulmonary ROS,    Pulmonary exam normal breath sounds clear to auscultation       Cardiovascular hypertension, Normal cardiovascular exam+ dysrhythmias Atrial Fibrillation  Rhythm:Regular Rate:Normal     Neuro/Psych negative neurological ROS  negative psych ROS   GI/Hepatic negative GI ROS, Neg liver ROS,   Endo/Other  negative endocrine ROS  Renal/GU negative Renal ROS  negative genitourinary   Musculoskeletal negative musculoskeletal ROS (+)   Abdominal   Peds negative pediatric ROS (+)  Hematology  (+) anemia ,   Anesthesia Other Findings   Reproductive/Obstetrics negative OB ROS                            Anesthesia Physical Anesthesia Plan  ASA: III  Anesthesia Plan: MAC   Post-op Pain Management:    Induction: Intravenous  PONV Risk Score and Plan: 0  Airway Management Planned: Simple Face Mask  Additional Equipment:   Intra-op Plan:   Post-operative Plan:   Informed Consent: I have reviewed the patients History and Physical, chart, labs and discussed the procedure including the risks, benefits and alternatives for the proposed anesthesia with the patient or authorized representative who has indicated his/her understanding and acceptance.   Dental advisory given  Plan Discussed with: CRNA and Surgeon  Anesthesia Plan Comments:         Anesthesia Quick Evaluation

## 2017-09-09 NOTE — Transfer of Care (Signed)
Immediate Anesthesia Transfer of Care Note  Patient: Kristen Garrison  Procedure(s) Performed: ESOPHAGOGASTRODUODENOSCOPY (EGD) WITH PROPOFOL (N/A )  Patient Location: PACU and Endoscopy Unit  Anesthesia Type:MAC  Level of Consciousness: awake, alert , oriented and patient cooperative  Airway & Oxygen Therapy: Patient Spontanous Breathing and Patient connected to nasal cannula oxygen  Post-op Assessment: Report given to RN, Post -op Vital signs reviewed and stable and Patient moving all extremities  Post vital signs: Reviewed and stable  Last Vitals:  Vitals:   09/09/17 0855 09/09/17 0900  BP: (!) 169/58   Pulse: 73   Resp: (!) 23   Temp: 36.6 C   SpO2: 92% 96%    Last Pain:  Vitals:   09/09/17 0855  TempSrc: Oral         Complications: No apparent anesthesia complications

## 2017-09-09 NOTE — Interval H&P Note (Signed)
History and Physical Interval Note:  09/09/2017 9:57 AM  Kristen Garrison  has presented today for surgery, with the diagnosis of melena  The various methods of treatment have been discussed with the patient and family. After consideration of risks, benefits and other options for treatment, the patient has consented to  Procedure(s): ESOPHAGOGASTRODUODENOSCOPY (EGD) WITH PROPOFOL (N/A) as a surgical intervention .  The patient's history has been reviewed, patient examined, no change in status, stable for surgery.  I have reviewed the patient's chart and labs.  Questions were answered to the patient's satisfaction.     Yancey FlemingsJohn Reshad Saab

## 2017-09-09 NOTE — Evaluation (Signed)
Physical Therapy Evaluation Patient Details Name: Kristen Garrison MRN: [id number] DOB: [birth year] Today's Date: 09/09/2017   History of Present Illness  84 yo female admitted with anemia, weakness. Imaging showed compression fractures L1-L4. Hx of HTN, A fib, vertigo, neuropathy, OA, R TKA rev 12/2015, R hip fx, R ankle fx, lumbar fusion    Clinical Impression  On eval, pt required Min assist for mobility. She walked ~75 feet with a RW. Pt is unsteady and at risk for falls.Pt presents with general weakness, decreased activity tolerance, and impaired gait and balance. She fatigued fairly easily with activity. She denied dizziness during session. Discussed d/c plan with pt and family-she plans to d/c home at this time. Pt is open to idea of rehab if needed. Will continue to follow and assess progress.     Follow Up Recommendations SNF;Supervision/Assistance - 24 hour(depending on progress. May be able to d/c home if pt continues to improve. )    Equipment Recommendations  None recommended by PT    Recommendations for Other Services       Precautions / Restrictions Precautions Precautions: Fall Restrictions Weight Bearing Restrictions: No      Mobility  Bed Mobility Overal bed mobility: Needs Assistance Bed Mobility: Sit to Supine       Sit to supine: Min guard   General bed mobility comments: close guard for safety  Transfers Overall transfer level: Needs assistance Equipment used: Rolling walker (2 wheeled) Transfers: Sit to/from Stand Sit to Stand: Min assist;From elevated surface         General transfer comment: Cues for safety, hand placement Assist to rise, Pt attempted multiple times to stand unassisted from a normal surface height-she was unable. Once bed elevated, pt was able to stand  Ambulation/Gait Ambulation/Gait assistance: Min assist Ambulation Distance (Feet): 75 Feet Assistive device: Rolling walker (2 wheeled) Gait Pattern/deviations: Step-through  pattern;Decreased stride length     General Gait Details: Unsteady. Assist to stabilize pt intermittently. Pt fatigues fairly easily. O2 sat 93% on Ra.   Stairs            Wheelchair Mobility    Modified Rankin (Stroke Patients Only)       Balance Overall balance assessment: Needs assistance;History of Falls         Standing balance support: Bilateral upper extremity supported Standing balance-Leahy Scale: Poor                               Pertinent Vitals/Pain Pain Assessment: No/denies pain    Home Living Family/patient expects to be discharged to:: Private residence Living Arrangements: Spouse/significant other Available Help at Discharge: Personal care attendant(10-4 daily) Type of Home: House Home Access: Stairs to enter Entrance Stairs-Rails: Right Entrance Stairs-Number of Steps: 2 Home Layout: One level Home Equipment: Environmental consultant - 2 wheels      Prior Function Level of Independence: Independent with assistive device(s)         Comments: using RW more lately     Hand Dominance        Extremity/Trunk Assessment   Upper Extremity Assessment Upper Extremity Assessment: Generalized weakness    Lower Extremity Assessment Lower Extremity Assessment: Generalized weakness    Cervical / Trunk Assessment Cervical / Trunk Assessment: Kyphotic  Communication   Communication: No difficulties  Cognition Arousal/Alertness: Awake/alert Behavior During Therapy: WFL for tasks assessed/performed Overall Cognitive Status: Within Functional Limits for tasks assessed  General Comments      Exercises     Assessment/Plan    PT Assessment Patient needs continued PT services  PT Problem List Decreased strength;Decreased balance;Decreased range of motion;Decreased mobility;Decreased activity tolerance;Decreased knowledge of use of DME       PT Treatment Interventions DME  instruction;Gait training;Functional mobility training;Therapeutic activities;Balance training;Patient/family education;Therapeutic exercise    PT Goals (Current goals can be found in the Care Plan section)  Acute Rehab PT Goals Patient Stated Goal: to get better PT Goal Formulation: With patient/family Time For Goal Achievement: 09/23/17 Potential to Achieve Goals: Good    Frequency Min 3X/week   Barriers to discharge        Co-evaluation               AM-PAC PT "6 Clicks" Daily Activity  Outcome Measure Difficulty turning over in bed (including adjusting bedclothes, sheets and blankets)?: A Lot Difficulty moving from lying on back to sitting on the side of the bed? : A Lot Difficulty sitting down on and standing up from a chair with arms (e.g., wheelchair, bedside commode, etc,.)?: Unable Help needed moving to and from a bed to chair (including a wheelchair)?: A Little Help needed walking in hospital room?: A Little Help needed climbing 3-5 steps with a railing? : A Little 6 Click Score: 14    End of Session Equipment Utilized During Treatment: Gait belt Activity Tolerance: Patient limited by fatigue Patient left: in bed;with call bell/phone within reach;with family/visitor present   PT Visit Diagnosis: Muscle weakness (generalized) (M62.81);Difficulty in walking, not elsewhere classified (R26.2)    Time: [id number] PT Time Calculation (min) (ACUTE ONLY): 24 min   Charges:   PT Evaluation $PT Eval Moderate Complexity: 1 Mod PT Treatments $Gait Training: 8-22 mins   PT G Codes:          Rebeca AlertJannie Buckley Bradly, MPT Pager: [phone]

## 2017-09-09 NOTE — Op Note (Signed)
[facility] Patient Name: Kristen Garrison Procedure Date: 09/09/2017 MRN: [id number] Attending MD: Wilhemina BonitoJohn N. Marina GoodellPerry , MD Date of Birth: Apr 25, 1927 CSN: [id number] Age: 84 Admit Type: Inpatient Procedure:                Upper GI endoscopy Indications:              Heme positive stool, Melena. While on oral                            anticoagulants. Hemoglobin dropped 2 g from                            baseline. No active bleeding at the time of                            presentation. Brown stool Providers:                Wilhemina BonitoJohn N. Marina GoodellPerry, MD, Tomma RakersJennifer Kappus, RN, Verita SchneidersPrasun                            Sharma, Technician, Lauree ChandlerLacey A. Armistead, CRNA Referring MD:             [facility] Medicines:                Monitored Anesthesia Care Complications:            No immediate complications. Estimated Blood Loss:     Estimated blood loss: none. Procedure:                Pre-Anesthesia Assessment:                           - Prior to the procedure, a History and Physical                            was performed, and patient medications and                            allergies were reviewed. The patient's tolerance of                            previous anesthesia was also reviewed. The risks                            and benefits of the procedure and the sedation                            options and risks were discussed with the patient.                            All questions were answered, and informed consent                            was obtained. Prior Anticoagulants: The patient has  taken Xarelto (rivaroxaban), last dose was 2 days                            prior to procedure. ASA Grade Assessment: III - A                            patient with severe systemic disease. After                            reviewing the risks and benefits, the patient was                            deemed in satisfactory condition to undergo the               procedure.                           After obtaining informed consent, the endoscope was                            passed under direct vision. Throughout the                            procedure, the patient's blood pressure, pulse, and                            oxygen saturations were monitored continuously. The                            [id number] ([id number]) scope was introduced through the                            mouth, and advanced to the fourth part of duodenum.                            The upper GI endoscopy was accomplished without                            difficulty. The patient tolerated the procedure                            well. Scope In: Scope Out: Findings:      The esophagus was normal, except for large caliber esophageal stricture.      The stomach was normal.      The examined duodenum was normal to the fourth portion.      The cardia and gastric fundus were normal on retroflexion. Impression:               1. Incidental large caliber esophageal ring and                            hiatal hernia.                           2. Otherwise normal EGD  3. No active bleeding. Moderate Sedation:      none Recommendation:           1. Clear liquid diet                           2. Prep today for colonoscopy tomorrow                           Discussed with patient and son. Procedure report                            provided to them Procedure Code(s):        --- Professional ---                           [phone], Esophagogastroduodenoscopy, flexible,                            transoral; diagnostic, including collection of                            specimen(s) by brushing or washing, when performed                            (separate procedure) Diagnosis Code(s):        --- Professional ---                           R19.5, Other fecal abnormalities                           K92.1, Melena (includes Hematochezia) CPT copyright 2016  American Medical Association. All rights reserved. The codes documented in this report are preliminary and upon coder review may  be revised to meet current compliance requirements. Wilhemina Bonito. Marina Goodell, MD 09/09/2017 10:20:06 AM This report has been signed electronically. Number of Addenda: 0

## 2017-09-09 NOTE — Anesthesia Postprocedure Evaluation (Signed)
Anesthesia Post Note  Patient: Kristen Garrison  Procedure(s) Performed: ESOPHAGOGASTRODUODENOSCOPY (EGD) WITH PROPOFOL (N/A )     Patient location during evaluation: PACU Anesthesia Type: MAC Level of consciousness: awake and alert Pain management: pain level controlled Vital Signs Assessment: post-procedure vital signs reviewed and stable Respiratory status: spontaneous breathing, nonlabored ventilation, respiratory function stable and patient connected to nasal cannula oxygen Cardiovascular status: stable and blood pressure returned to baseline Postop Assessment: no apparent nausea or vomiting Anesthetic complications: no    Last Vitals:  Vitals:   09/09/17 1020 09/09/17 1030  BP: (!) 151/61 (!) 172/59  Pulse: 65 64  Resp: 17 17  Temp:    SpO2: 98% 99%    Last Pain:  Vitals:   09/09/17 0855  TempSrc: Oral                 Dahlton Hinde S

## 2017-09-09 NOTE — Progress Notes (Addendum)
PROGRESS NOTE    Kristen Garrison  YNW:[id number] DOB: August 16, 1926 DOA: 09/07/2017 PCP: [name], MD    Brief Narrative:   Kristen Garrison  is a 84 y.o. female, w hypertension, hyperlipidemia, P afib, Gerd, presents w c/o black stool for the past 1.5 weeks and generalized weakness.    she was admitted for evaluation of GI bleed.   GI consulted and plan for EGD 3/12  Assessment & Plan:   Active Problems:   Esophageal reflux   Atrial fibrillation (HCC)   HTN (hypertension)   Adequate anticoagulation on anticoagulant therapy   Weakness of both lower extremities   Anemia  Melena, Black tarry stools/acute blood loss anemia / suspect upper GI bleed/ vs possibly from Xarelto use: Need to rule out peptic ulcer disease versus erosive gastropathy. Status post transfusion of 2 units of prbc  EGD showed.. Incidental large hiatal hernia, recommended to be on clear liquids today, prep for colonoscopy tomorrow Continue PPI twice a day Hemoglobin improved to 11.0 from 8.0 after 2 units of packed red blood cells  Transfuse to keep hemoglobin greater than 8.    Paroxysmal atrial fibrillation:  Rate not well controlled. cardizem added on.  xarelto on hold for GI bleed.  Consider switching to Eliquis??  Hypertension:  Well controlled today.    GERD:  On PPI.   Acute anemia from blood loss superimposed on chronic anemia  Baseline hemoglobin around 10,  status post 2 units of prbc transfusion  .  Recheck H&H in am.   Depression-continue Effexor, trazodone,clorazepate      DVT prophylaxis: scd's Code Status: full code.  Family Communication: none at  Bedside.  Disposition Plan:  Anticipate discharge tomorrow if hemoglobin stable   Consultants:   Gastroenterology Dr Marina Goodell.    Procedures:EGD scheduled in am.    Antimicrobials: none.    Subjective: No new complaints. Status post  EGD, denies any nausea vomiting abdominal pain  Objective: Vitals:   09/08/17 2212  09/09/17 0629 09/09/17 0855 09/09/17 0900  BP: (!) 155/71 (!) 167/78 (!) 169/58   Pulse: 81 78 73   Resp: 18 20 (!) 23   Temp: 98.2 F (36.8 C) 97.9 F (36.6 C) 97.8 F (36.6 C)   TempSrc: Oral Oral Oral   SpO2: 93% 92% 92% 96%  Weight:  75.8 kg (167 lb 1.7 oz) 75.8 kg (167 lb 1.7 oz)   Height:   5\' 4"  (1.626 m)     Intake/Output Summary (Last 24 hours) at 09/09/2017 0947 Last data filed at 09/09/2017 0640 Gross per 24 hour  Intake 1222.75 ml  Output 1250 ml  Net -27.25 ml   Filed Weights   09/08/17 0604 09/09/17 0629 09/09/17 0855  Weight: 77.4 kg (170 lb 10.2 oz) 75.8 kg (167 lb 1.7 oz) 75.8 kg (167 lb 1.7 oz)    Examination:  General exam: Appears calm and comfortable  Respiratory system: Clear to auscultation. Respiratory effort normal. Cardiovascular system: S1 & S2 heard, irregular, tachycardic. No JVD, murmurs,  No pedal edema. Gastrointestinal system: Abdomen is nondistended, soft and nontender. No organomegaly or masses felt. Normal bowel sounds heard. Central nervous system: Alert and oriented. No focal neurological deficits. Extremities: Symmetric 5 x 5 power. Skin: No rashes, lesions or ulcers Psychiatry: Judgement and insight appear normal. Mood & affect appropriate.     Data Reviewed: I have personally reviewed following labs and imaging studies  CBC: Recent Labs  Lab 09/07/17 1631 09/08/17 0013 09/08/17 0016 09/08/17 1214 09/09/17 0000  WBC 9.8 8.2 8.2  --   --   HGB 8.1* 8.0* 8.0* 8.9* 11.0*  HCT 27.2* 26.2* 26.2* 30.2* 35.1*  MCV 84.7 83.7 83.7  --   --   PLT 460* 472* 472*  --   --    Basic Metabolic Panel: Recent Labs  Lab 09/07/17 1631 09/08/17 0013  NA 138 140  K 4.1 3.9  CL 101 102  CO2 27 28  GLUCOSE 116* 108*  BUN 41* 34*  CREATININE 1.48* 1.25*  CALCIUM 8.3* 8.5*   GFR: Estimated Creatinine Clearance: 31 mL/min (A) (by C-G formula based on SCr of 1.25 mg/dL (H)). Liver Function Tests: Recent Labs  Lab 09/07/17 1647  09/08/17 0013  AST 23 17  ALT 18 18  ALKPHOS 65 66  BILITOT 0.6 0.5  PROT 5.9* 6.1*  ALBUMIN 2.7* 2.9*   No results for input(s): LIPASE, AMYLASE in the last 168 hours. No results for input(s): AMMONIA in the last 168 hours. Coagulation Profile: No results for input(s): INR, PROTIME in the last 168 hours. Cardiac Enzymes: Recent Labs  Lab 09/07/17 1647 09/08/17 0013 09/08/17 0016 09/08/17 1053  TROPONINI <0.03 <0.03 <0.03 <0.03   BNP (last 3 results) No results for input(s): PROBNP in the last 8760 hours. HbA1C: No results for input(s): HGBA1C in the last 72 hours. CBG: No results for input(s): GLUCAP in the last 168 hours. Lipid Profile: No results for input(s): CHOL, HDL, LDLCALC, TRIG, CHOLHDL, LDLDIRECT in the last 72 hours. Thyroid Function Tests: No results for input(s): TSH, T4TOTAL, FREET4, T3FREE, THYROIDAB in the last 72 hours. Anemia Panel: Recent Labs    09/08/17 0503  VITAMINB12 817  FERRITIN 23  TIBC 325  IRON 15*   Sepsis Labs: No results for input(s): PROCALCITON, LATICACIDVEN in the last 168 hours.  No results found for this or any previous visit (from the past 240 hour(s)).       Radiology Studies: Dg Chest 2 View  Result Date: 09/07/2017 CLINICAL DATA:  Weakness and dizziness EXAM: CHEST - 2 VIEW COMPARISON:  03/29/2016 FINDINGS: Cardiac shadow is mildly enlarged. Loop recorder is again noted. Patient is rotated to the right accentuating the mediastinal markings. Mild basilar scarring is noted stable from the previous study. No sizable effusion or focal infiltrate is noted. Degenerative change of the thoracic spine is seen. IMPRESSION: No acute intrathoracic abnormality noted. Stable bibasilar scarring. Electronically Signed   By: Alcide Clever M.D.   On: 09/07/2017 17:22   Dg Lumbar Spine Complete  Result Date: 09/07/2017 CLINICAL DATA:  Low back pain, no known injury, initial encounter EXAM: LUMBAR SPINE - COMPLETE 4+ VIEW COMPARISON:   03/27/2017 FINDINGS: Postsurgical changes are noted at L4-5. Multilevel disc space narrowing is seen. Compression deformities are seen involving L1, L2, L3 and L4. The overall appearance is stable with the exception of some progression of the superior endplate deformity at L3 when compared with the prior exam. Facet hypertrophic changes are noted. No pars defects are seen. No soft tissue abnormality is noted. IMPRESSION: Degenerative and postoperative changes. Multiple compression deformities as described. Electronically Signed   By: Alcide Clever M.D.   On: 09/07/2017 17:24        Scheduled Meds: . [MAR Hold] clorazepate  3.75 mg Oral TID  . [MAR Hold] diltiazem  30 mg Oral Q8H  . [MAR Hold] predniSONE  15 mg Oral BID  . [MAR Hold] traZODone  50 mg Oral QHS  . [MAR Hold] venlafaxine XR  75  mg Oral Q breakfast   Continuous Infusions: . pantoprozole (PROTONIX) infusion 8 mg/hr (09/09/17 0640)     LOS: 2 days    Time spent: 35 minutes.     Richarda OverlieNayana Brieana Shimmin, MD [facility] Pager [phone]  If 7PM-7AM, please contact night-coverage [email or web address] Password [email or web address] 09/09/2017, 9:47 AM

## 2017-09-10 ENCOUNTER — Inpatient Hospital Stay (HOSPITAL_COMMUNITY): Payer: Medicare HMO | Admitting: Certified Registered Nurse Anesthetist

## 2017-09-10 ENCOUNTER — Encounter (HOSPITAL_COMMUNITY): Payer: Self-pay | Admitting: Certified Registered Nurse Anesthetist

## 2017-09-10 ENCOUNTER — Encounter (HOSPITAL_COMMUNITY)
Admission: EM | Disposition: A | Discharge status: Home / Self Care | Payer: Self-pay | Source: Home / Self Care | Attending: Internal Medicine

## 2017-09-10 DIAGNOSIS — R29898 Other symptoms and signs involving the musculoskeletal system: Secondary | ICD-10-CM

## 2017-09-10 DIAGNOSIS — R195 Other fecal abnormalities: Secondary | ICD-10-CM

## 2017-09-10 DIAGNOSIS — K921 Melena: Secondary | ICD-10-CM

## 2017-09-10 HISTORY — PX: COLONOSCOPY WITH PROPOFOL: SHX5780

## 2017-09-10 LAB — FOLATE RBC
Folate, Hemolysate: >620 ng/mL
HEMATOCRIT: 26.2 % — AB (ref 34.0–46.6)

## 2017-09-10 LAB — BASIC METABOLIC PANEL
ANION GAP: 8 (ref 5–15)
BUN: 13 mg/dL (ref 6–20)
CALCIUM: 8.7 mg/dL — AB (ref 8.9–10.3)
CO2: 21 mmol/L — ABNORMAL LOW (ref 22–32)
Chloride: 114 mmol/L — ABNORMAL HIGH (ref 101–111)
Creatinine, Ser: 0.95 mg/dL (ref 0.44–1.00)
GFR calc Af Amer: >60 mL/min (ref >60)
GFR, EST NON AFRICAN AMERICAN: 52 mL/min — AB (ref >60)
GLUCOSE: 130 mg/dL — AB (ref 65–99)
Potassium: 4.3 mmol/L (ref 3.5–5.1)
SODIUM: 143 mmol/L (ref 135–145)

## 2017-09-10 LAB — CBC
HCT: 38 % (ref 36.0–46.0)
Hemoglobin: 11.9 g/dL — ABNORMAL LOW (ref 12.0–15.0)
MCH: 26.8 pg (ref 26.0–34.0)
MCHC: 31.3 g/dL (ref 30.0–36.0)
MCV: 85.6 fL (ref 78.0–100.0)
PLATELETS: 344 10*3/uL (ref 150–400)
RBC: 4.44 MIL/uL (ref 3.87–5.11)
RDW: 15.7 % — AB (ref 11.5–15.5)
WBC: 9.3 10*3/uL (ref 4.0–10.5)

## 2017-09-10 SURGERY — COLONOSCOPY WITH PROPOFOL
Anesthesia: Monitor Anesthesia Care

## 2017-09-10 MED ORDER — ONDANSETRON HCL 4 MG/2ML IJ SOLN
INTRAMUSCULAR | Status: DC | PRN
  Administered 2017-09-10: 4 mg via INTRAVENOUS

## 2017-09-10 MED ORDER — FENTANYL CITRATE (PF) 100 MCG/2ML IJ SOLN: 25.0000 ug | INTRAMUSCULAR | Status: DC | PRN

## 2017-09-10 MED ORDER — ONDANSETRON HCL 4 MG/2ML IJ SOLN: 4.0000 mg | Freq: Once | INTRAMUSCULAR | Status: DC | PRN

## 2017-09-10 MED ORDER — PROPOFOL 10 MG/ML IV BOLUS
INTRAVENOUS | Status: AC
  Filled 2017-09-10: qty 40

## 2017-09-10 MED ORDER — LACTATED RINGERS IV SOLN
INTRAVENOUS | Status: DC
  Administered 2017-09-10: 09:00:00 via INTRAVENOUS

## 2017-09-10 MED ORDER — DILTIAZEM HCL 30 MG PO TABS: 30.0000 mg | ORAL_TABLET | Freq: Three times a day (TID) | ORAL | 0 refills | Status: DC

## 2017-09-10 MED ORDER — PROPOFOL 500 MG/50ML IV EMUL
INTRAVENOUS | Status: DC | PRN
  Administered 2017-09-10: 150 ug/kg/min via INTRAVENOUS

## 2017-09-10 SURGICAL SUPPLY — 21 items

## 2017-09-10 NOTE — Op Note (Signed)
[facility] Patient Name: Kristen Garrison Procedure Date: 09/10/2017 MRN: [id number] Attending MD: Wilhemina Bonito. Marina Goodell , MD Date of Birth: Mar 09, 1927 CSN: [id number] Age: 84 Admit Type: Inpatient Procedure:                Colonoscopy Indications:              Heme positive stool, Melena. Negative EGD                            yesterday. Had been on oral anticoagulants Providers:                Wilhemina Bonito. Marina Goodell, MD, Dwain Sarna, RN, Madalyn Rob, Technician Referring MD:             [facility] Medicines:                Monitored Anesthesia Care Complications:            No immediate complications. Estimated blood loss:                            None. Estimated Blood Loss:     Estimated blood loss: none. Procedure:                Pre-Anesthesia Assessment:                           - Prior to the procedure, a History and Physical                            was performed, and patient medications and                            allergies were reviewed. The patient's tolerance of                            previous anesthesia was also reviewed. The risks                            and benefits of the procedure and the sedation                            options and risks were discussed with the patient.                            All questions were answered, and informed consent                            was obtained. Prior Anticoagulants: The patient has                            taken Xarelto (rivaroxaban), last dose was 3 days  prior to procedure. ASA Grade Assessment: III - A                            patient with severe systemic disease. After                            reviewing the risks and benefits, the patient was                            deemed in satisfactory condition to undergo the                            procedure.                           After obtaining informed consent, the colonoscope                          was passed under direct vision. Throughout the                            procedure, the patient's blood pressure, pulse, and                            oxygen saturations were monitored continuously. The                            [id number] ([id number] scope was introduced through                            the anus and advanced to the the cecum, identified                            by the ileocecal valve. The ileocecal valve and the                            rectum were photographed. The quality of the bowel                            preparation was excellent. The colonoscopy was                            performed with difficulty secondary to tortuous                            colon and body habitus. The patient tolerated the                            procedure well. The bowel preparation used was                            MoviPrep. Scope In: 9:10:48 AM Scope Out: 9:52:15 AM Scope Withdrawal Time: 0 hours 8 minutes 57 seconds  Total Procedure Duration: 0 hours 41 minutes 27 seconds  Findings:      The entire examined colon appeared normal on direct and retroflexion       views. Deep cecal intubation with visualization of the appendiceal       orifice not achieved. Impression:               - The entire examined colon is normal on direct and                            retroflexion views.                           - No specimens collected. Moderate Sedation:      none Recommendation:           1. Diet of choice                           2. Okay to resume oral anticoagulation if medically                            necessary                           3. If patient were to have evidence of recurrent GI                            bleeding would recommend capsule endoscopy                           4. Okay for discharge home from GI standpoint                           5. No specific GI follow-up needed                           Discussed with son and patient.  A copy of the                            procedure port was provided Procedure Code(s):        --- Professional ---                           [phone], Colonoscopy, flexible; diagnostic, including                            collection of specimen(s) by brushing or washing,                            when performed (separate procedure) Diagnosis Code(s):        --- Professional ---                           R19.5, Other fecal abnormalities                           K92.1, Melena (includes Hematochezia) CPT copyright 2016 [employer] Medical Association. All rights reserved. The  codes documented in this report are preliminary and upon coder review may  be revised to meet current compliance requirements. Wilhemina Bonito. Marina Goodell, MD 09/10/2017 10:04:06 AM This report has been signed electronically. Number of Addenda: 0

## 2017-09-10 NOTE — Progress Notes (Signed)
Physical Therapy Treatment Patient Details Name: Kristen Garrison MRN: [id number] DOB: [birth year] Today's Date: 09/10/2017    History of Present Illness 84 yo female admitted with anemia, weakness. Imaging showed compression fractures L1-L4. Hx of HTN, A fib, vertigo, neuropathy, OA, R TKA rev 12/2015, R hip fx, R ankle fx, lumbar fusion    PT Comments    Pt reports she has not been OOB today, not even to use restroom/bsc. She continues to require Min assist for mobility. She is unsafe at times. She is at risk for falls when mobilizing. Continue to recommend ST rehab at SNF , if pt/family are agreeable. Pt does have an aide who is present that provides 5 hours of care on a daily basis.  If pt decides to return home, recommend HHPT and 24 hour supervision/assist.     Follow Up Recommendations  SNF (HHPT and 24 hour supervision/assist if pt returns home)     Equipment Recommendations  None recommended by PT    Recommendations for Other Services       Precautions / Restrictions Precautions Precautions: Fall Restrictions Weight Bearing Restrictions: No    Mobility  Bed Mobility Overal bed mobility: Needs Assistance Bed Mobility: Supine to Sit;Sit to Supine     Supine to sit: HOB elevated;Min guard Sit to supine: Min assist;HOB elevated   General bed mobility comments: Min assist for LEs back onto bed and for repositioning in bed. Increased time.   Transfers Overall transfer level: Needs assistance Equipment used: Rolling walker (2 wheeled) Transfers: Sit to/from Stand Sit to Stand: Min assist;From elevated surface         General transfer comment: Small amount of assist to rise, steady, control descent. VCs safety, hand placement. (Aide reports pt requires assist to stand at baseline. )  Ambulation/Gait Ambulation/Gait assistance: Min assist Ambulation Distance (Feet): 75 Feet Assistive device: Rolling walker (2 wheeled) Gait Pattern/deviations: Step-through  pattern;Decreased stride length     General Gait Details: Intermittent assist to steady pt during ambulation. Near LOB x 1 requiring external assist from therapist to prevent fall. Cues for safety. Pt attempted to leave RW behind before safely positioned to sit down on bed. Audible wheezing noted.    Stairs            Wheelchair Mobility    Modified Rankin (Stroke Patients Only)       Balance Overall balance assessment: Needs assistance         Standing balance support: Bilateral upper extremity supported Standing balance-Leahy Scale: Poor                              Cognition Arousal/Alertness: Awake/alert Behavior During Therapy: WFL for tasks assessed/performed Overall Cognitive Status: Within Functional Limits for tasks assessed                                        Exercises      General Comments        Pertinent Vitals/Pain Pain Assessment: No/denies pain    Home Living                      Prior Function            PT Goals (current goals can now be found in the care plan section) Progress towards PT goals: Progressing toward goals  Frequency    Min 3X/week      PT Plan Current plan remains appropriate    Co-evaluation              AM-PAC PT "6 Clicks" Daily Activity  Outcome Measure  Difficulty turning over in bed (including adjusting bedclothes, sheets and blankets)?: A Lot Difficulty moving from lying on back to sitting on the side of the bed? : A Lot Difficulty sitting down on and standing up from a chair with arms (e.g., wheelchair, bedside commode, etc,.)?: Unable Help needed moving to and from a bed to chair (including a wheelchair)?: A Little Help needed walking in hospital room?: A Little Help needed climbing 3-5 steps with a railing? : A Lot 6 Click Score: 13    End of Session Equipment Utilized During Treatment: Gait belt Activity Tolerance: Patient tolerated treatment  well Patient left: in bed;with call bell/phone within reach;with bed alarm set;with family/visitor present   PT Visit Diagnosis: Muscle weakness (generalized) (M62.81);Difficulty in walking, not elsewhere classified (R26.2)     Time: [id number] PT Time Calculation (min) (ACUTE ONLY): 14 min  Charges:  $Gait Training: 8-22 mins                    G Codes:         Rebeca AlertJannie Lucylle Foulkes, MPT Pager: [phone]

## 2017-09-10 NOTE — Interval H&P Note (Signed)
History and Physical Interval Note:  09/10/2017 9:06 AM  Kristen Garrison  has presented today for surgery, with the diagnosis of GI bleed  The various methods of treatment have been discussed with the patient and family. After consideration of risks, benefits and other options for treatment, the patient has consented to  Procedure(s): COLONOSCOPY WITH PROPOFOL (N/A) as a surgical intervention .  The patient's history has been reviewed, patient examined, no change in status, stable for surgery.  I have reviewed the patient's chart and labs.  Questions were answered to the patient's satisfaction.     Yancey FlemingsJohn Perry

## 2017-09-10 NOTE — Interval H&P Note (Signed)
History and Physical Interval Note:  09/10/2017 9:05 AM  Kristen Garrison  has presented today for surgery, with the diagnosis of GI bleed  The various methods of treatment have been discussed with the patient and family. After consideration of risks, benefits and other options for treatment, the patient has consented to  Procedure(s): COLONOSCOPY WITH PROPOFOL (N/A) as a surgical intervention .  The patient's history has been reviewed, patient examined, no change in status, stable for surgery.  I have reviewed the patient's chart and labs.  Questions were answered to the patient's satisfaction.     Yancey FlemingsJohn Shaan Rhoads

## 2017-09-10 NOTE — Transfer of Care (Signed)
Immediate Anesthesia Transfer of Care Note  Patient: Kristen Garrison  Procedure(s) Performed: COLONOSCOPY WITH PROPOFOL (N/A )  Patient Location: PACU  Anesthesia Type:MAC  Level of Consciousness: sedated  Airway & Oxygen Therapy: Patient Spontanous Breathing and Patient connected to face mask oxygen  Post-op Assessment: Report given to RN and Post -op Vital signs reviewed and stable  Post vital signs: Reviewed and stable  Last Vitals:  Vitals:   09/10/17 0424 09/10/17 0841  BP: (!) 155/83 (!) 180/73  Pulse: 65 70  Resp: 20 16  Temp: 36.6 C 36.8 C  SpO2: 98% 94%    Last Pain:  Vitals:   09/10/17 0841  TempSrc: Oral  PainSc:          Complications: No apparent anesthesia complications

## 2017-09-10 NOTE — Progress Notes (Addendum)
Pt declined SNF and HH services at present time. Pt and husband was at bedside states that they have sitters in the home everyday. PCS was also in pt's room. Encouraged pt and husband to call PCP if they change their mind.

## 2017-09-10 NOTE — Anesthesia Preprocedure Evaluation (Signed)
Anesthesia Evaluation  Patient identified by MRN, date of birth, ID band Patient awake    Reviewed: Allergy & Precautions, NPO status , Patient's Chart, lab work & pertinent test results  Airway Mallampati: IV  TM Distance: <3 FB Neck ROM: Limited    Dental  (+) Upper Dentures   Pulmonary neg pulmonary ROS,    Pulmonary exam normal breath sounds clear to auscultation       Cardiovascular hypertension, Pt. on medications Normal cardiovascular exam+ dysrhythmias Atrial Fibrillation  Rhythm:Regular Rate:Normal     Neuro/Psych PSYCHIATRIC DISORDERS Anxiety negative neurological ROS     GI/Hepatic Neg liver ROS, GERD  Medicated,  Endo/Other  negative endocrine ROS  Renal/GU negative Renal ROS  negative genitourinary   Musculoskeletal  (+) Arthritis , Osteoarthritis,    Abdominal   Peds negative pediatric ROS (+)  Hematology  (+) Blood dyscrasia (Xarelto), anemia ,   Anesthesia Other Findings Day of surgery medications reviewed with the patient.  Reproductive/Obstetrics negative OB ROS                             Anesthesia Physical  Anesthesia Plan  ASA: III  Anesthesia Plan: MAC   Post-op Pain Management:    Induction: Intravenous  PONV Risk Score and Plan: 2 and Treatment may vary due to age or medical condition and TIVA  Airway Management Planned: Simple Face Mask  Additional Equipment:   Intra-op Plan:   Post-operative Plan:   Informed Consent: I have reviewed the patients History and Physical, chart, labs and discussed the procedure including the risks, benefits and alternatives for the proposed anesthesia with the patient or authorized representative who has indicated his/her understanding and acceptance.   Dental advisory given  Plan Discussed with: CRNA and Surgeon  Anesthesia Plan Comments:         Anesthesia Quick Evaluation

## 2017-09-10 NOTE — Discharge Summary (Signed)
Physician Discharge Summary  Kristen Garrison ZOX:[id number] DOB: 1926/10/10 DOA: 09/07/2017  PCP: [name], MD  Admit date: 09/07/2017 Discharge date: 09/10/2017  Admitted From: Home Disposition:  Home; SNF was recommended and then she refused and refused Home Health Services PT as well   Recommendations for Outpatient Follow-up:  1. Follow up with PCP in 1-2 weeks 2. Follow up with [facility] within 1-2 weeks 3. Please obtain CMP/CBC, Mag, Phos in one week 4. Please follow up on the following pending results:  Home Health: No (refused) Equipment/Devices: None recommended by PT  Discharge Condition: Guarded CODE STATUS: FULL CODE Diet recommendation: Heart Healthy Diet   Brief/Interim Summary: [name],w hypertension, hyperlipidemia, P afib, Gerd, who presented w c/o black stool for the past 1.5 weeks and generalized weakness. She was admitted for evaluation of GI bleed.   GI consulted and did EGD 3/12 and colonoscopy 3/13 and was unremarkable. Diet was advanced and GI recommended that she could resume Anticoagulation and have capsule endoscopy if recurrent GIB occurred. PT evaluated and recommended SNF but she refused SNF and Home Health and will be D/C'd Home as she is stable. She will need to follow up with PCP and GI as an outpatient.  Discharge Diagnoses:  Active Problems:   Esophageal reflux   Atrial fibrillation (HCC)   HTN (hypertension)   Adequate anticoagulation on anticoagulant therapy   Weakness of both lower extremities   Anemia   Hiatal hernia   Esophageal ring   Heme positive stool   Melena  Melena, Black tarry stools/acute blood loss anemia -Suspected upper GI bleed/ vs possibly from Xarelto use -Needed to rule out peptic ulcer disease versus erosive gastropathy and EGD done. -EGD showed Incidental large hiatal hernia, recommended to be on clear liquids today, prep for colonoscopy tomorrow -Colonoscopy done and  Normal -Status post transfusion of 2 units of prbc  -Resume Home PPI  -Hemoglobin improved to 11.0 from 8.0 after 2 units of packed red blood cells  -Transfuse to keep hemoglobin greater than 8.  -If bleeding Reoccurs follow up for Capsule Endoscopy -Follow up with Gastroenterology and per GI ok to resume Anticoagulation  Paroxysmal Atrial Fibrillation:  -Rate not well controlled. cardizem added on with improvement.  -Xarelto was on hold for GI bleed and ok to resume  -Consider switching to Eliquis as an outpatient   Hypertension:  -BP was 158/73 -Resume Home Meds  GERD:  -C/w Home PPI.   Acute anemia from blood loss superimposed on chronic anemia  -Baseline hemoglobin around 10,  status post 2 units of prbc transfusion  -Anemia Panel showed Iron of 15, UIBC of 310, TIBC of 325, Saturation Ratio of 5, Ferritin of 23, Folate Hemosylate >620, Folate RBC >2,366, and Vitamin B12 of 817 -Follow up on SPEP -Recheck H&H in am. Stable  Depression -Continue Effexor, trazodone,clorazepate   Generalized Weakness -Likely from Blood Loss and Anemia -PT/OT Evaluated and recommending SNF but patient refusing and also refusing   AKI -Likely 2/2 to Blood Loss/Melena -Improved -Avoid Nephrotoxic Medications and repeat CMP as an outpatient   Discharge Instructions  Discharge Instructions    Call MD for:  difficulty breathing, headache or visual disturbances   Complete by:  As directed    Call MD for:  extreme fatigue   Complete by:  As directed    Call MD for:  hives   Complete by:  As directed    Call MD for:  persistant dizziness or light-headedness  Complete by:  As directed    Call MD for:  persistant nausea and vomiting   Complete by:  As directed    Call MD for:  redness, tenderness, or signs of infection (pain, swelling, redness, odor or green/yellow discharge around incision site)   Complete by:  As directed    Call MD for:  severe uncontrolled pain   Complete by:   As directed    Call MD for:  temperature >100.4   Complete by:  As directed    Diet - low sodium heart healthy   Complete by:  As directed    Discharge instructions   Complete by:  As directed    Follow up with PCP and GI as needed. Take all medications as prescribed. If symptoms change or worsen please return to the ED for evaluation.   Increase activity slowly   Complete by:  As directed      Allergies as of 09/10/2017      Reactions   Clindamycin/lincomycin Diarrhea   Sulfonamide Derivatives Cough   Warfarin Sodium Other (See Comments)   Bleeding problems--Likely due to uncorrected Warfarin requirements   Cefzil [cefprozil] Other (See Comments)   Unknown reaction.Kristen Garrison [pregabalin] Other (See Comments)   Tramadol Other (See Comments)   Carvedilol Other (See Comments)   dizzy   Celecoxib Rash   Doxycycline Nausea Only   Gabapentin Rash   Guanfacine Hcl Other (See Comments)   REACTION: diarrhea   Lisinopril Other (See Comments)   REACTION: cough      Medication List    STOP taking these medications   amLODipine 5 MG tablet Commonly known as:  NORVASC   diclofenac sodium 1 % Gel Commonly known as:  VOLTAREN   valsartan 160 MG tablet Commonly known as:  DIOVAN     TAKE these medications   clorazepate 3.75 MG tablet Commonly known as:  TRANXENE Take 3.75 mg by mouth 3 (three) times daily.   diltiazem 30 MG tablet Commonly known as:  CARDIZEM Take 1 tablet (30 mg total) by mouth every 8 (eight) hours.   furosemide 40 MG tablet Commonly known as:  LASIX Take 60 mg by mouth daily.   HYDROcodone-acetaminophen 5-325 MG tablet Commonly known as:  NORCO/VICODIN Take 1-2 tablets by mouth every 6 (six) hours as needed for severe pain.   KLOR-CON M20 20 MEQ tablet Generic drug:  potassium chloride SA Take 20 mEq by mouth daily. Monday - Friday only.   losartan 100 MG tablet Commonly known as:  COZAAR Take 100 mg by mouth daily.   multivitamin with  minerals Tabs tablet Take 1 tablet by mouth daily.   pantoprazole 40 MG tablet Commonly known as:  PROTONIX Take 40 mg by mouth 2 (two) times daily.   predniSONE 5 MG tablet Commonly known as:  DELTASONE Take 15 mg by mouth 2 (two) times daily.   traZODone 50 MG tablet Commonly known as:  DESYREL Take 1 tablet (50 mg total) by mouth at bedtime.   venlafaxine XR 37.5 MG 24 hr capsule Commonly known as:  EFFEXOR-XR Take 75 mg by mouth daily with breakfast.   XARELTO 15 MG Tabs tablet Generic drug:  Rivaroxaban TAKE 1 TABLET BY MOUTH EVERY DAY       Allergies  Allergen Reactions  . Clindamycin/Lincomycin Diarrhea  . Sulfonamide Derivatives Cough  . Warfarin Sodium Other (See Comments)    Bleeding problems--Likely due to uncorrected Warfarin requirements  . Cefzil [Cefprozil] Other (See Comments)  Unknown reaction..  . Lyrica [Pregabalin] Other (See Comments)  . Tramadol Other (See Comments)  . Carvedilol Other (See Comments)    dizzy  . Celecoxib Rash  . Doxycycline Nausea Only  . Gabapentin Rash  . Guanfacine Hcl Other (See Comments)    REACTION: diarrhea  . Lisinopril Other (See Comments)    REACTION: cough    Consultations:  Gastroenterology Dr. Marina Goodell  Procedures/Studies: Dg Chest 2 View  Result Date: 09/07/2017 CLINICAL DATA:  Weakness and dizziness EXAM: CHEST - 2 VIEW COMPARISON:  03/29/2016 FINDINGS: Cardiac shadow is mildly enlarged. Loop recorder is again noted. Patient is rotated to the right accentuating the mediastinal markings. Mild basilar scarring is noted stable from the previous study. No sizable effusion or focal infiltrate is noted. Degenerative change of the thoracic spine is seen. IMPRESSION: No acute intrathoracic abnormality noted. Stable bibasilar scarring. Electronically Signed   By: Alcide Clever M.D.   On: 09/07/2017 17:22   Dg Lumbar Spine Complete  Result Date: 09/07/2017 CLINICAL DATA:  Low back pain, no known injury, initial  encounter EXAM: LUMBAR SPINE - COMPLETE 4+ VIEW COMPARISON:  03/27/2017 FINDINGS: Postsurgical changes are noted at L4-5. Multilevel disc space narrowing is seen. Compression deformities are seen involving L1, L2, L3 and L4. The overall appearance is stable with the exception of some progression of the superior endplate deformity at L3 when compared with the prior exam. Facet hypertrophic changes are noted. No pars defects are seen. No soft tissue abnormality is noted. IMPRESSION: Degenerative and postoperative changes. Multiple compression deformities as described. Electronically Signed   By: Alcide Clever M.D.   On: 09/07/2017 17:24    EGD Findings:      The esophagus was normal, except for large caliber esophageal stricture.      The stomach was normal.      The examined duodenum was normal to the fourth portion.      The cardia and gastric fundus were normal on retroflexion. Impression:               1. Incidental large caliber esophageal ring and                            hiatal hernia.                           2. Otherwise normal EGD                           3. No active bleeding.  COLONOSCOPY  Findings:      The entire examined colon appeared normal on direct and retroflexion       views. Deep cecal intubation with visualization of the appendiceal       orifice not achieved. Impression:               - The entire examined colon is normal on direct and                            retroflexion views.                           - No specimens collected.  Subjective: Seen and examined and had no complaints. Wanted to go home and refused SNF. No SOB or lightheadedness.  Discharge Exam:  Vitals:   09/10/17 1015 09/10/17 1354  BP: (!) 176/65 (!) 158/73  Pulse: 61 85  Resp: 18 (!) 22  Temp:  98.1 F (36.7 C)  SpO2: 97% 92%   Vitals:   09/10/17 1005 09/10/17 1010 09/10/17 1015 09/10/17 1354  BP: (!) 171/69 (!) 184/97 (!) 176/65 (!) 158/73  Pulse: 68 63 61 85  Resp: 18 19 18  (!) 22   Temp:    98.1 F (36.7 C)  TempSrc:    Oral  SpO2: 95% 95% 97% 92%  Weight:      Height:       General: Pt is an obese Caucasian female who is alert, awake, not in acute distress Cardiovascular: RRR, S1/S2 +, no rubs, no gallops Respiratory: Diminished bilaterally, no wheezing, no rhonchi Abdominal: Soft, NT, ND, bowel sounds + Extremities: no edema, no cyanosis  The results of significant diagnostics from this hospitalization (including imaging, microbiology, ancillary and laboratory) are listed below for reference.    Microbiology: No results found for this or any previous visit (from the past 240 hour(s)).   Labs: BNP (last 3 results) No results for input(s): BNP in the last 8760 hours. Basic Metabolic Panel: Recent Labs  Lab 09/07/17 1631 09/08/17 0013 09/10/17 0557  NA 138 140 143  K 4.1 3.9 4.3  CL 101 102 114*  CO2 27 28 21*  GLUCOSE 116* 108* 130*  BUN 41* 34* 13  CREATININE 1.48* 1.25* 0.95  CALCIUM 8.3* 8.5* 8.7*   Liver Function Tests: Recent Labs  Lab 09/07/17 1647 09/08/17 0013  AST 23 17  ALT 18 18  ALKPHOS 65 66  BILITOT 0.6 0.5  PROT 5.9* 6.1*  ALBUMIN 2.7* 2.9*   No results for input(s): LIPASE, AMYLASE in the last 168 hours. No results for input(s): AMMONIA in the last 168 hours. CBC: Recent Labs  Lab 09/07/17 1631 09/08/17 0013 09/08/17 0016 09/08/17 1214 09/09/17 0000 09/10/17 0557  WBC 9.8 8.2 8.2  --   --  9.3  HGB 8.1* 8.0* 8.0* 8.9* 11.0* 11.9*  HCT 27.2* 26.2* 26.2* 30.2* 35.1* 38.0  MCV 84.7 83.7 83.7  --   --  85.6  PLT 460* 472* 472*  --   --  344   Cardiac Enzymes: Recent Labs  Lab 09/07/17 1647 09/08/17 0013 09/08/17 0016 09/08/17 1053  TROPONINI <0.03 <0.03 <0.03 <0.03   BNP: Invalid input(s): POCBNP CBG: No results for input(s): GLUCAP in the last 168 hours. D-Dimer No results for input(s): DDIMER in the last 72 hours. Hgb A1c No results for input(s): HGBA1C in the last 72 hours. Lipid Profile No  results for input(s): CHOL, HDL, LDLCALC, TRIG, CHOLHDL, LDLDIRECT in the last 72 hours. Thyroid function studies No results for input(s): TSH, T4TOTAL, T3FREE, THYROIDAB in the last 72 hours.  Invalid input(s): FREET3 Anemia work up Recent Labs    09/08/17 0503  VITAMINB12 817  FERRITIN 23  TIBC 325  IRON 15*   Urinalysis    Component Value Date/Time   COLORURINE YELLOW 09/07/2017 1753   APPEARANCEUR CLEAR 09/07/2017 1753   LABSPEC 1.006 09/07/2017 1753   PHURINE 6.0 09/07/2017 1753   GLUCOSEU NEGATIVE 09/07/2017 1753   HGBUR NEGATIVE 09/07/2017 1753   BILIRUBINUR NEGATIVE 09/07/2017 1753   KETONESUR NEGATIVE 09/07/2017 1753   PROTEINUR NEGATIVE 09/07/2017 1753   UROBILINOGEN 0.2 06/03/2013 2046   NITRITE NEGATIVE 09/07/2017 1753   LEUKOCYTESUR NEGATIVE 09/07/2017 1753   Sepsis Labs Invalid input(s): PROCALCITONIN,  WBC,  LACTICIDVEN Microbiology No  results found for this or any previous visit (from the past 240 hour(s)).  Time coordinating discharge: 35 minutes  SIGNED:  Merlene Laughtermair Latif Sheikh, DO [facility] 09/10/2017, 3:13 PM Pager [phone]  If 7PM-7AM, please contact night-coverage [email or web address] Password [email or web address]

## 2017-09-10 NOTE — Anesthesia Postprocedure Evaluation (Signed)
Anesthesia Post Note  Patient: Kristen Garrison  Procedure(s) Performed: COLONOSCOPY WITH PROPOFOL (N/A )     Patient location during evaluation: Endoscopy Anesthesia Type: MAC Level of consciousness: awake and alert Pain management: pain level controlled Vital Signs Assessment: post-procedure vital signs reviewed and stable Respiratory status: spontaneous breathing, nonlabored ventilation, respiratory function stable and patient connected to nasal cannula oxygen Cardiovascular status: stable and blood pressure returned to baseline Postop Assessment: no apparent nausea or vomiting Anesthetic complications: no    Last Vitals:  Vitals:   09/10/17 1015 09/10/17 1354  BP: (!) 176/65 (!) 158/73  Pulse: 61 85  Resp: 18 (!) 22  Temp:  36.7 C  SpO2: 97% 92%    Last Pain:  Vitals:   09/10/17 1354  TempSrc: Oral  PainSc:                  Catalina Gravel

## 2017-09-11 ENCOUNTER — Encounter (HOSPITAL_COMMUNITY): Payer: Self-pay | Admitting: Internal Medicine

## 2017-10-19 ENCOUNTER — Encounter (HOSPITAL_COMMUNITY): Payer: Self-pay | Admitting: Emergency Medicine

## 2017-10-19 ENCOUNTER — Inpatient Hospital Stay (HOSPITAL_COMMUNITY): Payer: Medicare HMO

## 2017-10-19 ENCOUNTER — Inpatient Hospital Stay (HOSPITAL_COMMUNITY)
Admission: EM | Admit: 2017-10-19 | Discharge: 2017-10-24 | DRG: 378 | Disposition: A | Discharge status: Skilled Nursing Facility | Payer: Medicare HMO | Attending: Internal Medicine | Admitting: Internal Medicine

## 2017-10-19 DIAGNOSIS — Z6830 Body mass index (BMI) 30.0-30.9, adult: Secondary | ICD-10-CM

## 2017-10-19 DIAGNOSIS — Z96651 Presence of right artificial knee joint: Secondary | ICD-10-CM | POA: Diagnosis present

## 2017-10-19 DIAGNOSIS — Z823 Family history of stroke: Secondary | ICD-10-CM | POA: Diagnosis not present

## 2017-10-19 DIAGNOSIS — Z95818 Presence of other cardiac implants and grafts: Secondary | ICD-10-CM | POA: Diagnosis not present

## 2017-10-19 DIAGNOSIS — I480 Paroxysmal atrial fibrillation: Secondary | ICD-10-CM | POA: Diagnosis present

## 2017-10-19 DIAGNOSIS — K31811 Angiodysplasia of stomach and duodenum with bleeding: Secondary | ICD-10-CM | POA: Diagnosis present

## 2017-10-19 DIAGNOSIS — F0390 Unspecified dementia without behavioral disturbance: Secondary | ICD-10-CM | POA: Diagnosis present

## 2017-10-19 DIAGNOSIS — N390 Urinary tract infection, site not specified: Secondary | ICD-10-CM

## 2017-10-19 DIAGNOSIS — M1990 Unspecified osteoarthritis, unspecified site: Secondary | ICD-10-CM | POA: Diagnosis present

## 2017-10-19 DIAGNOSIS — E611 Iron deficiency: Secondary | ICD-10-CM | POA: Diagnosis present

## 2017-10-19 DIAGNOSIS — Z7952 Long term (current) use of systemic steroids: Secondary | ICD-10-CM

## 2017-10-19 DIAGNOSIS — N183 Chronic kidney disease, stage 3 unspecified: Secondary | ICD-10-CM | POA: Diagnosis present

## 2017-10-19 DIAGNOSIS — G629 Polyneuropathy, unspecified: Secondary | ICD-10-CM | POA: Diagnosis present

## 2017-10-19 DIAGNOSIS — I491 Atrial premature depolarization: Secondary | ICD-10-CM | POA: Diagnosis present

## 2017-10-19 DIAGNOSIS — D62 Acute posthemorrhagic anemia: Secondary | ICD-10-CM

## 2017-10-19 DIAGNOSIS — I4891 Unspecified atrial fibrillation: Secondary | ICD-10-CM | POA: Diagnosis present

## 2017-10-19 DIAGNOSIS — Z7901 Long term (current) use of anticoagulants: Secondary | ICD-10-CM

## 2017-10-19 DIAGNOSIS — R296 Repeated falls: Secondary | ICD-10-CM | POA: Diagnosis present

## 2017-10-19 DIAGNOSIS — K449 Diaphragmatic hernia without obstruction or gangrene: Secondary | ICD-10-CM | POA: Diagnosis present

## 2017-10-19 DIAGNOSIS — D649 Anemia, unspecified: Secondary | ICD-10-CM | POA: Diagnosis present

## 2017-10-19 DIAGNOSIS — I10 Essential (primary) hypertension: Secondary | ICD-10-CM | POA: Diagnosis not present

## 2017-10-19 DIAGNOSIS — K219 Gastro-esophageal reflux disease without esophagitis: Secondary | ICD-10-CM

## 2017-10-19 DIAGNOSIS — E860 Dehydration: Secondary | ICD-10-CM | POA: Diagnosis present

## 2017-10-19 DIAGNOSIS — N3000 Acute cystitis without hematuria: Secondary | ICD-10-CM | POA: Diagnosis not present

## 2017-10-19 DIAGNOSIS — D500 Iron deficiency anemia secondary to blood loss (chronic): Secondary | ICD-10-CM | POA: Diagnosis not present

## 2017-10-19 DIAGNOSIS — I129 Hypertensive chronic kidney disease with stage 1 through stage 4 chronic kidney disease, or unspecified chronic kidney disease: Secondary | ICD-10-CM | POA: Diagnosis present

## 2017-10-19 DIAGNOSIS — R195 Other fecal abnormalities: Secondary | ICD-10-CM | POA: Diagnosis present

## 2017-10-19 DIAGNOSIS — W19XXXA Unspecified fall, initial encounter: Secondary | ICD-10-CM | POA: Diagnosis not present

## 2017-10-19 DIAGNOSIS — Z885 Allergy status to narcotic agent status: Secondary | ICD-10-CM

## 2017-10-19 DIAGNOSIS — I951 Orthostatic hypotension: Secondary | ICD-10-CM | POA: Diagnosis present

## 2017-10-19 DIAGNOSIS — F419 Anxiety disorder, unspecified: Secondary | ICD-10-CM | POA: Diagnosis present

## 2017-10-19 DIAGNOSIS — K922 Gastrointestinal hemorrhage, unspecified: Secondary | ICD-10-CM

## 2017-10-19 DIAGNOSIS — Z888 Allergy status to other drugs, medicaments and biological substances status: Secondary | ICD-10-CM

## 2017-10-19 DIAGNOSIS — E669 Obesity, unspecified: Secondary | ICD-10-CM | POA: Diagnosis present

## 2017-10-19 DIAGNOSIS — Z882 Allergy status to sulfonamides status: Secondary | ICD-10-CM

## 2017-10-19 DIAGNOSIS — K259 Gastric ulcer, unspecified as acute or chronic, without hemorrhage or perforation: Secondary | ICD-10-CM | POA: Diagnosis not present

## 2017-10-19 DIAGNOSIS — Z881 Allergy status to other antibiotic agents status: Secondary | ICD-10-CM

## 2017-10-19 DIAGNOSIS — R531 Weakness: Secondary | ICD-10-CM | POA: Diagnosis not present

## 2017-10-19 LAB — CBC WITH DIFFERENTIAL/PLATELET
Basophils Absolute: 0 10*3/uL (ref 0.0–0.1)
Basophils Relative: 0 %
EOS ABS: 0 10*3/uL (ref 0.0–0.7)
EOS PCT: 0 %
HCT: 27 % — ABNORMAL LOW (ref 36.0–46.0)
Hemoglobin: 8 g/dL — ABNORMAL LOW (ref 12.0–15.0)
LYMPHS ABS: 2.5 10*3/uL (ref 0.7–4.0)
Lymphocytes Relative: 24 %
MCH: 26.1 pg (ref 26.0–34.0)
MCHC: 29.6 g/dL — ABNORMAL LOW (ref 30.0–36.0)
MCV: 88.2 fL (ref 78.0–100.0)
Monocytes Absolute: 0.5 10*3/uL (ref 0.1–1.0)
Monocytes Relative: 5 %
Neutro Abs: 7.5 10*3/uL (ref 1.7–7.7)
Neutrophils Relative %: 71 %
PLATELETS: 329 10*3/uL (ref 150–400)
RBC: 3.06 MIL/uL — ABNORMAL LOW (ref 3.87–5.11)
RDW: 18.4 % — ABNORMAL HIGH (ref 11.5–15.5)
WBC: 10.6 10*3/uL — ABNORMAL HIGH (ref 4.0–10.5)

## 2017-10-19 LAB — BASIC METABOLIC PANEL
ANION GAP: 13 (ref 5–15)
BUN: 30 mg/dL — ABNORMAL HIGH (ref 6–20)
CHLORIDE: 101 mmol/L (ref 101–111)
CO2: 25 mmol/L (ref 22–32)
Calcium: 8.8 mg/dL — ABNORMAL LOW (ref 8.9–10.3)
Creatinine, Ser: 1.24 mg/dL — ABNORMAL HIGH (ref 0.44–1.00)
GFR calc non Af Amer: 38 mL/min — ABNORMAL LOW (ref >60)
GFR, EST AFRICAN AMERICAN: 44 mL/min — AB (ref >60)
Glucose, Bld: 135 mg/dL — ABNORMAL HIGH (ref 65–99)
Potassium: 3.9 mmol/L (ref 3.5–5.1)
Sodium: 139 mmol/L (ref 135–145)

## 2017-10-19 LAB — URINALYSIS, ROUTINE W REFLEX MICROSCOPIC
BILIRUBIN URINE: NEGATIVE
Glucose, UA: NEGATIVE mg/dL
Hgb urine dipstick: NEGATIVE
KETONES UR: NEGATIVE mg/dL
LEUKOCYTES UA: NEGATIVE
NITRITE: POSITIVE — AB
PH: 6 (ref 5.0–8.0)
Protein, ur: NEGATIVE mg/dL
Specific Gravity, Urine: 1.009 (ref 1.005–1.030)

## 2017-10-19 LAB — I-STAT TROPONIN, ED: TROPONIN I, POC: 0.02 ng/mL (ref 0.00–0.08)

## 2017-10-19 LAB — POC OCCULT BLOOD, ED: FECAL OCCULT BLD: POSITIVE — AB

## 2017-10-19 LAB — PREPARE RBC (CROSSMATCH)

## 2017-10-19 MED ORDER — ADULT MULTIVITAMIN W/MINERALS CH
1.0000 | ORAL_TABLET | Freq: Every day | ORAL | Status: DC
  Administered 2017-10-20 – 2017-10-24 (×4): 1 via ORAL
  Filled 2017-10-19 (×4): qty 1

## 2017-10-19 MED ORDER — DILTIAZEM HCL 30 MG PO TABS
30.0000 mg | ORAL_TABLET | Freq: Three times a day (TID) | ORAL | Status: DC
  Administered 2017-10-20 – 2017-10-24 (×15): 30 mg via ORAL
  Filled 2017-10-19 (×16): qty 1

## 2017-10-19 MED ORDER — SODIUM CHLORIDE 0.9 % IV SOLN
1.0000 g | Freq: Once | INTRAVENOUS | Status: AC
  Administered 2017-10-20: 1 g via INTRAVENOUS
  Filled 2017-10-19: qty 10

## 2017-10-19 MED ORDER — ONDANSETRON HCL 4 MG PO TABS: 4.0000 mg | ORAL_TABLET | Freq: Four times a day (QID) | ORAL | Status: DC | PRN

## 2017-10-19 MED ORDER — CLORAZEPATE DIPOTASSIUM 3.75 MG PO TABS
3.7500 mg | ORAL_TABLET | Freq: Three times a day (TID) | ORAL | Status: DC
  Administered 2017-10-20 – 2017-10-24 (×13): 3.75 mg via ORAL
  Filled 2017-10-19 (×13): qty 1

## 2017-10-19 MED ORDER — SODIUM CHLORIDE 0.9 % IV SOLN
Freq: Once | INTRAVENOUS | Status: AC
  Administered 2017-10-20: 02:00:00 via INTRAVENOUS

## 2017-10-19 MED ORDER — PREDNISONE 5 MG PO TABS
15.0000 mg | ORAL_TABLET | Freq: Two times a day (BID) | ORAL | Status: DC
  Administered 2017-10-20 – 2017-10-24 (×8): 15 mg via ORAL
  Filled 2017-10-19 (×8): qty 1

## 2017-10-19 MED ORDER — ONDANSETRON HCL 4 MG/2ML IJ SOLN: 4.0000 mg | Freq: Four times a day (QID) | INTRAMUSCULAR | Status: DC | PRN

## 2017-10-19 MED ORDER — ZOLPIDEM TARTRATE 5 MG PO TABS
5.0000 mg | ORAL_TABLET | Freq: Every evening | ORAL | Status: DC | PRN
  Administered 2017-10-20: 5 mg via ORAL
  Filled 2017-10-19: qty 1

## 2017-10-19 MED ORDER — HYDRALAZINE HCL 20 MG/ML IJ SOLN: 5.0000 mg | INTRAMUSCULAR | Status: DC | PRN

## 2017-10-19 MED ORDER — HYDROCODONE-ACETAMINOPHEN 5-325 MG PO TABS: 1.0000 | ORAL_TABLET | Freq: Four times a day (QID) | ORAL | Status: DC | PRN

## 2017-10-19 MED ORDER — SODIUM CHLORIDE 0.9 % IV BOLUS
1000.0000 mL | Freq: Once | INTRAVENOUS | Status: AC
  Administered 2017-10-19: 1000 mL via INTRAVENOUS

## 2017-10-19 MED ORDER — PANTOPRAZOLE SODIUM 40 MG PO TBEC
40.0000 mg | DELAYED_RELEASE_TABLET | Freq: Every day | ORAL | Status: DC
  Administered 2017-10-19: 40 mg via ORAL
  Filled 2017-10-19: qty 1

## 2017-10-19 MED ORDER — LOSARTAN POTASSIUM 50 MG PO TABS
100.0000 mg | ORAL_TABLET | Freq: Every day | ORAL | Status: DC
  Administered 2017-10-20 – 2017-10-24 (×4): 100 mg via ORAL
  Filled 2017-10-19 (×4): qty 2

## 2017-10-19 MED ORDER — TRAZODONE HCL 50 MG PO TABS
50.0000 mg | ORAL_TABLET | Freq: Every day | ORAL | Status: DC
  Administered 2017-10-20 – 2017-10-23 (×3): 50 mg via ORAL
  Filled 2017-10-19 (×4): qty 1

## 2017-10-19 MED ORDER — LATANOPROST 0.005 % OP SOLN
1.0000 [drp] | Freq: Every day | OPHTHALMIC | Status: DC
  Administered 2017-10-20 – 2017-10-23 (×4): 1 [drp] via OPHTHALMIC
  Filled 2017-10-19: qty 2.5

## 2017-10-19 MED ORDER — HYDROCORTISONE NA SUCCINATE PF 100 MG IJ SOLR
50.0000 mg | Freq: Once | INTRAMUSCULAR | Status: AC
  Administered 2017-10-20: 50 mg via INTRAVENOUS
  Filled 2017-10-19: qty 2

## 2017-10-19 MED ORDER — VENLAFAXINE HCL ER 75 MG PO CP24
150.0000 mg | ORAL_CAPSULE | Freq: Every day | ORAL | Status: DC
  Administered 2017-10-20 – 2017-10-24 (×4): 150 mg via ORAL
  Filled 2017-10-19 (×2): qty 1
  Filled 2017-10-19 (×4): qty 2

## 2017-10-19 MED ORDER — SENNOSIDES-DOCUSATE SODIUM 8.6-50 MG PO TABS: 1.0000 | ORAL_TABLET | Freq: Every evening | ORAL | Status: DC | PRN

## 2017-10-19 MED ORDER — ACETAMINOPHEN 325 MG PO TABS
650.0000 mg | ORAL_TABLET | Freq: Four times a day (QID) | ORAL | Status: DC | PRN
  Administered 2017-10-20 – 2017-10-22 (×4): 650 mg via ORAL
  Filled 2017-10-19 (×4): qty 2

## 2017-10-19 MED ORDER — PANTOPRAZOLE SODIUM 40 MG IV SOLR
40.0000 mg | Freq: Two times a day (BID) | INTRAVENOUS | Status: DC
  Administered 2017-10-20 – 2017-10-22 (×6): 40 mg via INTRAVENOUS
  Filled 2017-10-19 (×7): qty 40

## 2017-10-19 MED ORDER — ACETAMINOPHEN 650 MG RE SUPP: 650.0000 mg | Freq: Four times a day (QID) | RECTAL | Status: DC | PRN

## 2017-10-19 MED ORDER — SODIUM CHLORIDE 0.9 % IV SOLN
1.0000 g | Freq: Once | INTRAVENOUS | Status: AC
  Administered 2017-10-19: 1 g via INTRAVENOUS
  Filled 2017-10-19: qty 10

## 2017-10-19 NOTE — ED Notes (Signed)
Patient transported to CT 
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
## 2017-10-19 NOTE — ED Triage Notes (Addendum)
Per [facility], pt from home. Pt has fallen x3 today. Pt reports she is walking and falls straight down. No obvious injuries. Denies hitting head. Pt denies pain. Pt walks with a walker, A&O. Pt reports taking 3 steps and going down. Pt reports weakness worse today than yesterday. Orthostatic positive.  EMS BP sitting 156/64, P 84. Standing BP 130/72, P 92. R 20, 95% room air, CBG 165.

## 2017-10-20 ENCOUNTER — Other Ambulatory Visit: Payer: Self-pay

## 2017-10-20 DIAGNOSIS — Z7901 Long term (current) use of anticoagulants: Secondary | ICD-10-CM

## 2017-10-20 DIAGNOSIS — D649 Anemia, unspecified: Secondary | ICD-10-CM

## 2017-10-20 DIAGNOSIS — R195 Other fecal abnormalities: Secondary | ICD-10-CM

## 2017-10-20 LAB — RETICULOCYTES
RBC.: 2.77 MIL/uL — AB (ref 3.87–5.11)
RETIC COUNT ABSOLUTE: 124.7 10*3/uL (ref 19.0–186.0)
Retic Ct Pct: 4.5 % — ABNORMAL HIGH (ref 0.4–3.1)

## 2017-10-20 LAB — BASIC METABOLIC PANEL
Anion gap: 11 (ref 5–15)
BUN: 20 mg/dL (ref 6–20)
CHLORIDE: 102 mmol/L (ref 101–111)
CO2: 23 mmol/L (ref 22–32)
Calcium: 8.1 mg/dL — ABNORMAL LOW (ref 8.9–10.3)
Creatinine, Ser: 1.01 mg/dL — ABNORMAL HIGH (ref 0.44–1.00)
GFR calc Af Amer: 56 mL/min — ABNORMAL LOW (ref >60)
GFR calc non Af Amer: 48 mL/min — ABNORMAL LOW (ref >60)
GLUCOSE: 93 mg/dL (ref 65–99)
POTASSIUM: 3.7 mmol/L (ref 3.5–5.1)
Sodium: 136 mmol/L (ref 135–145)

## 2017-10-20 LAB — CBC
HCT: 24.2 % — ABNORMAL LOW (ref 36.0–46.0)
HCT: 29.8 % — ABNORMAL LOW (ref 36.0–46.0)
HEMATOCRIT: 36 % (ref 36.0–46.0)
HEMATOCRIT: 34.2 % — AB (ref 36.0–46.0)
HEMOGLOBIN: 9.4 g/dL — AB (ref 12.0–15.0)
HEMOGLOBIN: 10.7 g/dL — AB (ref 12.0–15.0)
Hemoglobin: 7.4 g/dL — ABNORMAL LOW (ref 12.0–15.0)
Hemoglobin: 11.2 g/dL — ABNORMAL LOW (ref 12.0–15.0)
MCH: 26.7 pg (ref 26.0–34.0)
MCH: 27.6 pg (ref 26.0–34.0)
MCH: 27.4 pg (ref 26.0–34.0)
MCH: 27.5 pg (ref 26.0–34.0)
MCHC: 30.6 g/dL (ref 30.0–36.0)
MCHC: 31.5 g/dL (ref 30.0–36.0)
MCHC: 31.1 g/dL (ref 30.0–36.0)
MCHC: 31.3 g/dL (ref 30.0–36.0)
MCV: 87.4 fL (ref 78.0–100.0)
MCV: 87.4 fL (ref 78.0–100.0)
MCV: 88 fL (ref 78.0–100.0)
MCV: 87.9 fL (ref 78.0–100.0)
PLATELETS: 314 10*3/uL (ref 150–400)
Platelets: 298 10*3/uL (ref 150–400)
Platelets: 306 10*3/uL (ref 150–400)
Platelets: 289 10*3/uL (ref 150–400)
RBC: 2.77 MIL/uL — ABNORMAL LOW (ref 3.87–5.11)
RBC: 3.41 MIL/uL — AB (ref 3.87–5.11)
RBC: 4.09 MIL/uL (ref 3.87–5.11)
RBC: 3.89 MIL/uL (ref 3.87–5.11)
RDW: 18.9 % — ABNORMAL HIGH (ref 11.5–15.5)
RDW: 18.1 % — ABNORMAL HIGH (ref 11.5–15.5)
RDW: 16.8 % — ABNORMAL HIGH (ref 11.5–15.5)
RDW: 17.3 % — ABNORMAL HIGH (ref 11.5–15.5)
WBC: 9.8 10*3/uL (ref 4.0–10.5)
WBC: 10.3 10*3/uL (ref 4.0–10.5)
WBC: 10.6 10*3/uL — AB (ref 4.0–10.5)
WBC: 10.1 10*3/uL (ref 4.0–10.5)

## 2017-10-20 LAB — IRON AND TIBC
Iron: 22 ug/dL — ABNORMAL LOW (ref 28–170)
Saturation Ratios: 7 % — ABNORMAL LOW (ref 10.4–31.8)
TIBC: 322 ug/dL (ref 250–450)
UIBC: 300 ug/dL

## 2017-10-20 LAB — GLUCOSE, CAPILLARY: GLUCOSE-CAPILLARY: 108 mg/dL — AB (ref 65–99)

## 2017-10-20 LAB — FERRITIN: FERRITIN: 32 ng/mL (ref 11–307)

## 2017-10-20 LAB — PROTIME-INR
INR: 1.47
Prothrombin Time: 17.7 seconds — ABNORMAL HIGH (ref 11.4–15.2)

## 2017-10-20 LAB — BRAIN NATRIURETIC PEPTIDE: B Natriuretic Peptide: 93.5 pg/mL (ref 0.0–100.0)

## 2017-10-20 LAB — VITAMIN B12: Vitamin B-12: 668 pg/mL (ref 180–914)

## 2017-10-20 LAB — CORTISOL-AM, BLOOD: Cortisol - AM: 14.4 ug/dL (ref 6.7–22.6)

## 2017-10-20 LAB — FOLATE: Folate: 32.3 ng/mL (ref >5.9)

## 2017-10-20 LAB — APTT: APTT: 27 s (ref 24–36)

## 2017-10-20 MED ORDER — PEG-KCL-NACL-NASULF-NA ASC-C 100 G PO SOLR
1.0000 | Freq: Once | ORAL | Status: AC
  Administered 2017-10-20: 200 g via ORAL
  Filled 2017-10-20: qty 1
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
## 2017-10-20 NOTE — Progress Notes (Signed)
Pt placed on low bed per protocol. Mats on each side of bed. Bed alarm is on.

## 2017-10-20 NOTE — Evaluation (Signed)
Physical Therapy Evaluation Patient Details Name: Kristen Garrison MRN: [id number] DOB: 09-25-1926 Today's Date: 10/20/2017   History of Present Illness  Kristen Garrison is a 84 y.o. female with medical history significant of hypertension, GERD, anxiety, atrial fibrillation on Xarelto, vertigo, syncope, dementia, CKD-3, GIB, anemia, who presents with fall, generalized weakness, dark stool and mild dysuria. Multifactorial etiology, including UTI, symptomatic anemia, dehydration, positive orthostatic status and peripheral neuropathy.  Clinical Impression   Pt admitted with above diagnosis. Pt currently with functional limitations due to the deficits listed below (see PT Problem List). Presents with functional dependencies, requiring two person assist for sit<>stand transfers; Painful knees, R greater than L, making transfers difficult; Still, once fully upright, she was able to bear weight and take a few steps to chair; She will need to move much better to go home; REc SNF for rehab;  Pt will benefit from skilled PT to increase their independence and safety with mobility to allow discharge to the venue listed below.       Follow Up Recommendations SNF    Equipment Recommendations  Rolling walker with 5" wheels;3in1 (PT)    Recommendations for Other Services       Precautions / Restrictions Precautions Precautions: Fall Restrictions Weight Bearing Restrictions: No      Mobility  Bed Mobility Overal bed mobility: Needs Assistance Bed Mobility: Supine to Sit     Supine to sit: Min assist     General bed mobility comments: Min handheld assist to pull to sit; noted good reciprocal scoot to EOB  Transfers Overall transfer level: Needs assistance Equipment used: Rolling walker (2 wheeled) Transfers: Sit to/from Stand Sit to Stand: Mod assist;+2 physical assistance         General transfer comment: Initial attempts to stand with unilateral one person assist unsuccessful; Better  with bilateral assist; Heavy mod assist to power up  Ambulation/Gait Ambulation/Gait assistance: Mod assist Ambulation Distance (Feet): (Pivotal steps bed to recliner ) Assistive device: Rolling walker (2 wheeled) Gait Pattern/deviations: Shuffle     General Gait Details: Painful knees with steps to recliner; heavy dependence on RW  Stairs            Wheelchair Mobility    Modified Rankin (Stroke Patients Only)       Balance Overall balance assessment: Needs assistance   Sitting balance-Leahy Scale: Fair       Standing balance-Leahy Scale: Zero                               Pertinent Vitals/Pain Pain Assessment: Faces Faces Pain Scale: Hurts even more Pain Location: bil knees; R more painful than L  Pain Descriptors / Indicators: Aching;Grimacing;Discomfort Pain Intervention(s): Monitored during session;Repositioned    Home Living Family/patient expects to be discharged to:: Private residence Living Arrangements: Spouse/significant other Available Help at Discharge: Personal care attendant(10-4 daily) Type of Home: House Home Access: Stairs to enter Entrance Stairs-Rails: Right Entrance Stairs-Number of Steps: 2 Home Layout: One level Home Equipment: Environmental consultantWalker - 2 wheels      Prior Function Level of Independence: Independent with assistive device(s)         Comments: Uses RW     Hand Dominance        Extremity/Trunk Assessment   Upper Extremity Assessment Upper Extremity Assessment: Defer to OT evaluation    Lower Extremity Assessment Lower Extremity Assessment: Generalized weakness(and pain in knees, R worse than L)  Communication   Communication: No difficulties  Cognition Arousal/Alertness: Awake/alert Behavior During Therapy: WFL for tasks assessed/performed Overall Cognitive Status: No family/caregiver present to determine baseline cognitive functioning                                 General  Comments: Noted dementia in history      General Comments General comments (skin integrity, edema, etc.): Orthostatics taken; See vitals flow sheet.     Exercises     Assessment/Plan    PT Assessment Patient needs continued PT services  PT Problem List Decreased strength;Decreased range of motion;Decreased activity tolerance;Decreased balance;Decreased mobility;Decreased knowledge of use of DME;Decreased coordination;Decreased cognition;Decreased knowledge of precautions;Decreased safety awareness;Pain       PT Treatment Interventions DME instruction;Gait training;Functional mobility training;Therapeutic activities;Therapeutic exercise;Balance training;Neuromuscular re-education;Cognitive remediation;Patient/family education    PT Goals (Current goals can be found in the Care Plan section)  Acute Rehab PT Goals Patient Stated Goal: agreeable to OOB PT Goal Formulation: With patient Time For Goal Achievement: 11/03/17 Potential to Achieve Goals: Good    Frequency Min 2X/week   Barriers to discharge Decreased caregiver support Husband is hemiparetic due to CVA    Co-evaluation               AM-PAC PT "6 Clicks" Daily Activity  Outcome Measure Difficulty turning over in bed (including adjusting bedclothes, sheets and blankets)?: A Lot Difficulty moving from lying on back to sitting on the side of the bed? : A Lot Difficulty sitting down on and standing up from a chair with arms (e.g., wheelchair, bedside commode, etc,.)?: Unable Help needed moving to and from a bed to chair (including a wheelchair)?: A Lot Help needed walking in hospital room?: A Lot Help needed climbing 3-5 steps with a railing? : Total 6 Click Score: 10    End of Session Equipment Utilized During Treatment: Gait belt Activity Tolerance: Patient tolerated treatment well(though painful LEs) Patient left: in chair;with call bell/phone within reach Nurse Communication: Mobility status PT Visit  Diagnosis: Unsteadiness on feet (R26.81);Other abnormalities of gait and mobility (R26.89);Muscle weakness (generalized) (M62.81);History of falling (Z91.81);Pain Pain - part of body: Knee(bilateral)    Time: 4540-9811 PT Time Calculation (min) (ACUTE ONLY): 26 min   Charges:   PT Evaluation $PT Eval Moderate Complexity: 1 Mod PT Treatments $Therapeutic Activity: 8-22 mins   PT G Codes:        Van Clines, PT  Acute Rehabilitation Services Pager [phone] Office [phone]   Levi Aland 10/20/2017, 10:55 AM

## 2017-10-20 NOTE — Evaluation (Signed)
Occupational Therapy Evaluation Patient Details Name: Radiah Latricia Heft Inscore MRN: [id number] DOB: Sep 23, 1926 Today's Date: 10/20/2017    History of Present Illness Cathline A Edwin Capesterman is a 84 y.o. female with medical history significant of hypertension, GERD, anxiety, atrial fibrillation on Xarelto, vertigo, syncope, dementia, CKD-3, GIB, anemia, who presents with fall, generalized weakness, dark stool and mild dysuria. Multifactorial etiology, including UTI, symptomatic anemia, dehydration, positive orthostatic status and peripheral neuropathy.   Clinical Impression   This 84 yo female admitted with above presents to acute OT with decreased mobility, decreased balance, increased pain all affecting her safety and independence with basic ADLs. She will benefit from acute OT with follow up OT at SNF to get back to PLOF.    Follow Up Recommendations  SNF;Supervision/Assistance - 24 hour    Equipment Recommendations  None recommended by OT       Precautions / Restrictions Precautions Precautions: Fall Restrictions Weight Bearing Restrictions: No      Mobility Bed Mobility Overal bed mobility: Needs Assistance Bed Mobility: Supine to Sit     Supine to sit: Min assist     General bed mobility comments: PT up in recliner upon my arrival  Transfers Overall transfer level: Needs assistance Equipment used: Rolling walker (2 wheeled) Transfers: Sit to/from Stand Sit to Stand: Mod assist;+2 physical assistance         General transfer comment: Initial attempt to stand with two person assist unsuccessful; Did better on 2nd attempt with +1 Mod A of belt and pad with me on pt's left side.    Balance Overall balance assessment: Needs assistance Sitting-balance support: No upper extremity supported;Feet supported Sitting balance-Leahy Scale: Good     Standing balance support: Bilateral upper extremity supported Standing balance-Leahy Scale: Poor Standing balance comment: reluctant to  step with LLE for fear that RLE will not support her; moves RLE to take step without difficulty                           ADL either performed or assessed with clinical judgement   ADL Overall ADL's : Needs assistance/impaired Eating/Feeding: Independent;Sitting   Grooming: Set up;Sitting   Upper Body Bathing: Set up;Sitting   Lower Body Bathing: Moderate assistance Lower Body Bathing Details (indicate cue type and reason): Mod A +2 sit<>stand and Mod A to maintain standing Upper Body Dressing : Set up;Sitting   Lower Body Dressing: Moderate assistance Lower Body Dressing Details (indicate cue type and reason): Mod A +2 sit<>stand and Mod A to maintain standing Toilet Transfer: Moderate assistance;+2 for physical assistance; RW   Toileting- Clothing Manipulation and Hygiene: Total assistance Toileting - Clothing Manipulation Details (indicate cue type and reason): Mod A +2 sit<>stand and Mod A to maintain standing             Vision Patient Visual Report: No change from baseline              Pertinent Vitals/Pain Pain Assessment: 0-10 Faces Pain Scale: Hurts even more Pain Location: bil knees/hips; R more painful than L  Pain Descriptors / Indicators: Aching;Grimacing;Discomfort Pain Intervention(s): Monitored during session;Repositioned     Hand Dominance Left   Extremity/Trunk Assessment Upper Extremity Assessment Upper Extremity Assessment: Generalized weakness   Lower Extremity Assessment Lower Extremity Assessment: Generalized weakness(and pain in knees, R worse than L)       Communication Communication Communication: No difficulties   Cognition Arousal/Alertness: Awake/alert Behavior During Therapy: WFL for tasks assessed/performed Overall  Cognitive Status: No family/caregiver present to determine baseline cognitive functioning                                 General Comments: Noted dementia in history               Home Living Family/patient expects to be discharged to:: Skilled nursing facility Living Arrangements: Spouse/significant other Available Help at Discharge: Personal care attendant(10-4 (5 days a week)) Type of Home: House Home Access: Stairs to enter [employer] of Steps: 2 Entrance Stairs-Rails: Right Home Layout: One level     Bathroom Shower/Tub: Producer, television/film/video: Standard     Home Equipment: Environmental consultant - 2 wheels;Shower seat;Bedside commode;Hand held shower head;Grab bars - tub/shower   Additional Comments: Lives at home with husband who has had a stroke affecting his right side, but able to take care of himself.      Prior Functioning/Environment Level of Independence: Needs assistance    ADL's / Homemaking Assistance Needed: A in and out of shower and sometimes for LBB/D; HHAide does all of the IADLs   Comments: Uses RW        OT Problem List: Decreased strength;Decreased activity tolerance;Impaired balance (sitting and/or standing);Pain;Obesity;Decreased knowledge of use of DME or AE      OT Treatment/Interventions: Self-care/ADL training;Balance training;DME and/or AE instruction;Patient/family education    OT Goals(Current goals can be found in the care plan section) Acute Rehab OT Goals Patient Stated Goal: hopeful to go home OT Goal Formulation: With patient Time For Goal Achievement: 11/03/17 Potential to Achieve Goals: Good  OT Frequency: Min 2X/week   Barriers to D/C: Decreased caregiver support             AM-PAC PT "6 Clicks" Daily Activity     Outcome Measure Help from another person eating meals?: None Help from another person taking care of personal grooming?: A Little Help from another person toileting, which includes using toliet, bedpan, or urinal?: A Lot Help from another person bathing (including washing, rinsing, drying)?: A Little Help from another person to put on and taking off regular upper body  clothing?: A Little Help from another person to put on and taking off regular lower body clothing?: A Lot 6 Click Score: 17   End of Session Equipment Utilized During Treatment: Gait belt;Rolling walker Nurse Communication: (RN in room with PT when pt got up and did A me x1 with sit to stand with pt)  Activity Tolerance: Patient limited by fatigue Patient left: in chair;with call bell/phone within reach;with chair alarm set  OT Visit Diagnosis: Unsteadiness on feet (R26.81);Other abnormalities of gait and mobility (R26.89);Muscle weakness (generalized) (M62.81);Pain Pain - Right/Left: (both) Pain - part of body: Knee;Hip                Time: 1130-1206 OT Time Calculation (min): 36 min Charges:  OT General Charges $OT Visit: 1 Visit OT Evaluation $OT Eval Moderate Complexity: 1 Mod OT Treatments $Self Care/Home Management : 8-22 mins Ignacia Palma, OTR/L [phone] 10/20/2017

## 2017-10-20 NOTE — Progress Notes (Signed)
PROGRESS NOTE    Kristen Garrison  ZDG:[id number] DOB: [birth year] DOA: 10/19/2017  PCP: Levin Erp, MD   Brief Narrative:  84 y.o. female with medical history significant for hypertension, GERD, anxiety, atrial fibrillation on Xarelto, vertigo, syncope, dementia, CKD-3, GIB, anemia, who presented with fall, generalized weakness, dark stool and mild dysuria for past 2 weeks prior to this presentation. No loss of consciousness. No N/V/D. No abdominal pain. In ED, pt was hemodynamically stable. Hgb dropped from 11.9 to 8.0, WBC count was 10.6. UA was positive for nitrates. FOBT was positive. GI has seen the pt in consultation.   Assessment & Plan:   Principal Problem:   Fall - Likely due to symptomatic anemia - PT eval  Active Problems:   Acute blood loss anemia / Acute upper GI bleed - Pt presented with tarry stools and positive FIBT - Hgb as low as 7.4 - S/P 2 U PRBC transfusion so far - GI saw the pt in consultation, will follow up their recommendations     Paroxysmal atrial fibrillation (HCC) - CHA2DS2-VASc Score 4 - Holding AC due to bleed - Rate controlled with cardizem    HTN (hypertension) essential - Continue Cardizem and losartan     UTI (urinary tract infection) - Continue rocephin - Follow up urine cx results - Follow up urine cx results     CKD (chronic kidney disease), stage III (HCC) - Follow up BMP daily    DVT prophylaxis: SCD's Code Status: full code  Family Communication: no family at the bedside  Disposition Plan: not yet stable for discharge, plan for GI to see the pt in consultation    Consultants:   GI  Procedures:   2 U PRBC transfusion   Antimicrobials:   None    Subjective: No overnight events.  Objective: Vitals:   10/20/17 0929 10/20/17 1210 10/20/17 1320 10/20/17 1700  BP: (!) 163/72 (!) 146/70 (!) 148/67 (!) 144/77  Pulse: (!) 107 79 83 82  Resp:  16  16  Temp:  98.6 F (37 C)  98.6 F (37 C)  TempSrc:  Oral   Oral  SpO2:  98%  97%  Weight:      Height:        Intake/Output Summary (Last 24 hours) at 10/20/2017 1752 Last data filed at 10/20/2017 1210 Gross per 24 hour  Intake 1632.91 ml  Output 500 ml  Net 1132.91 ml   Filed Weights   10/19/17 1805 10/20/17 0427  Weight: 79.4 kg (175 lb) 76.8 kg (169 lb 6.4 oz)    Examination:  General exam: Appears calm and comfortable  Respiratory system: Clear to auscultation. Respiratory effort normal. Cardiovascular system: S1 & S2 heard, RRR  Gastrointestinal system: Abdomen is nondistended, soft and nontender. No organomegaly or masses felt. Normal bowel sounds heard. Central nervous system: Alert and oriented. No focal neurological deficits. Extremities: Symmetric 5 x 5 power. Skin: Pale skin, warm and dry  Psychiatry: Judgement and insight appear normal. Mood & affect appropriate.   Data Reviewed: I have personally reviewed following labs and imaging studies  CBC: Recent Labs  Lab 10/19/17 1855 10/20/17 0129 10/20/17 0730  WBC 10.6* 9.8 10.3  NEUTROABS 7.5  --   --   HGB 8.0* 7.4* 9.4*  HCT 27.0* 24.2* 29.8*  MCV 88.2 87.4 87.4  PLT 329 314 595   Basic Metabolic Panel: Recent Labs  Lab 10/19/17 1925 10/20/17 0730  NA 139 136  K 3.9 3.7  CL 101 102  CO2 25 23  GLUCOSE 135* 93  BUN 30* 20  CREATININE 1.24* 1.01*  CALCIUM 8.8* 8.1*   GFR: Estimated Creatinine Clearance: 38.6 mL/min (A) (by C-G formula based on SCr of 1.01 mg/dL (H)). Liver Function Tests: No results for input(s): AST, ALT, ALKPHOS, BILITOT, PROT, ALBUMIN in the last 168 hours. No results for input(s): LIPASE, AMYLASE in the last 168 hours. No results for input(s): AMMONIA in the last 168 hours. Coagulation Profile: Recent Labs  Lab 10/20/17 0129  INR 1.47   Cardiac Enzymes: No results for input(s): CKTOTAL, CKMB, CKMBINDEX, TROPONINI in the last 168 hours. BNP (last 3 results) No results for input(s): PROBNP in the last 8760 hours. HbA1C: No  results for input(s): HGBA1C in the last 72 hours. CBG: Recent Labs  Lab 10/20/17 0800  GLUCAP 108*   Lipid Profile: No results for input(s): CHOL, HDL, LDLCALC, TRIG, CHOLHDL, LDLDIRECT in the last 72 hours. Thyroid Function Tests: No results for input(s): TSH, T4TOTAL, FREET4, T3FREE, THYROIDAB in the last 72 hours. Anemia Panel: Recent Labs    10/20/17 0129  VITAMINB12 668  FOLATE 32.3  FERRITIN 32  TIBC 322  IRON 22*  RETICCTPCT 4.5*   Urine analysis:    Component Value Date/Time   COLORURINE YELLOW 10/19/2017 2050   APPEARANCEUR CLEAR 10/19/2017 2050   LABSPEC 1.009 10/19/2017 2050   PHURINE 6.0 10/19/2017 2050   GLUCOSEU NEGATIVE 10/19/2017 2050   [place] NEGATIVE 10/19/2017 2050   [place] NEGATIVE 10/19/2017 2050   [place] 10/19/2017 2050   PROTEINUR NEGATIVE 10/19/2017 2050   UROBILINOGEN 0.2 06/03/2013 2046   NITRITE POSITIVE (A) 10/19/2017 2050   LEUKOCYTESUR NEGATIVE 10/19/2017 2050   Sepsis Labs: '@LABRCNTIP'$ (procalcitonin:4,lacticidven:4)   )No results found for this or any previous visit (from the past 240 hour(s)).    Radiology Studies: Ct Head Wo Contrast  Result Date: 10/19/2017 CLINICAL DATA:  Fall, hit head EXAM: CT HEAD WITHOUT CONTRAST TECHNIQUE: Contiguous axial images were obtained from the base of the skull through the vertex without intravenous contrast. COMPARISON:  06/02/2016 FINDINGS: Brain: There is atrophy and chronic small vessel disease changes. Specifically, no hemorrhage, mass lesion, acute infarction, or significant intracranial injury. Ventriculomegaly seems out of proportion for degree of atrophy. A component of normal pressure hydrocephalus cannot be excluded. No change. 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 Vascular: No hyperdense vessel or unexpected calcification. Skull: No acute calvarial abnormality. Sinuses/Orbits: Visualized paranasal sinuses and mastoids clear. Orbital soft tissues  unremarkable. Other: None IMPRESSION: Atrophy, chronic microvascular disease. Ventriculomegaly may be related to ex vacuo dilatation although normal pressure hydrocephalus cannot be excluded. Findings are stable. Electronically Signed   By: Rolm Baptise M.D.   On: 10/19/2017 23:03      Scheduled Meds: . clorazepate  3.75 mg Oral TID  . diltiazem  30 mg Oral Q8H  . latanoprost  1 drop Both Eyes QHS  . losartan  100 mg Oral Daily  . multivitamin with minerals  1 tablet Oral Daily  . pantoprazole  40 mg Intravenous Q12H  . peg 3350 powder  1 kit Oral Once  . predniSONE  15 mg Oral BID  . traZODone  50 mg Oral QHS  . venlafaxine XR  150 mg Oral Q breakfast   Continuous Infusions: . cefTRIAXone (ROCEPHIN)  IV       LOS: 1 day    Time spent: 25 minutes Greater than 50% of the time spent on counseling and coordinating the care.   Petrita Blunck  Charlies Silvers, MD [facility] Pager [phone]  If 7PM-7AM, please contact night-coverage [email or web address] Password [email or web address] 10/20/2017, 5:52 PM

## 2017-10-20 NOTE — NC FL2 (Signed)
[place] LEVEL OF CARE SCREENING TOOL     IDENTIFICATION  Patient Name: Kristen Garrison Birthdate: July 04, 1926 Sex: female Admission Date (Current Location): 10/19/2017  [facility] and [place] Number:  Herbalist and Address:  The [place], [place] [address] [place], [place] [address]      Provider Number: [id number]  Attending Physician Name and Address:  Robbie Lis, MD  Relative Name and Phone Number:       Current Level of Care: Hospital Recommended Level of Care: [place] Prior Approval Number:    Date Approved/Denied:   PASRR Number: pending  Discharge Plan: SNF    Current Diagnoses: Patient Active Problem List   Diagnosis Date Noted  . Fall 10/19/2017  . Orthostatic hypotension 10/19/2017  . Normocytic anemia 10/19/2017  . CKD (chronic kidney disease), stage III ([place]) 10/19/2017  . Acute blood loss anemia 10/19/2017  . Anxiety 10/19/2017  . Acute UTI   . Symptomatic anemia   . Heme positive stool   . Melena   . Hiatal hernia   . Esophageal ring   . Anemia 09/07/2017  . S/P total knee arthroplasty   . Arterial hypotension   . Elevated troponin   . Failed total right knee replacement ([place]) 01/12/2016  . Back pain 07/28/2014  . Weakness of both lower extremities 07/28/2014  . Lumbosacral radiculopathy at S1 05/25/2014  . Cardiac device in situ 02/22/2014  . Adequate anticoagulation on anticoagulant therapy 09/01/2013  . Atrial fibrillation ([place]) 06/04/2013  . HTN (hypertension) 06/04/2013  . UTI (urinary tract infection) 06/04/2013  . Contusion of right elbow and forearm 06/04/2013  . Syncope 06/03/2013  . Esophageal reflux 04/01/2008  . DIVERTICULOSIS OF COLON 04/01/2008  . DYSPHAGIA UNSPECIFIED 04/01/2008    Orientation RESPIRATION BLADDER Height & Weight     Self, Time, Situation, Place  Normal Continent, External catheter Weight: 169 lb 6.4 oz (76.8 kg) Height:  _0  (162.6 cm)   BEHAVIORAL SYMPTOMS/MOOD NEUROLOGICAL BOWEL NUTRITION STATUS      Continent Diet(please see DC summary)  AMBULATORY STATUS COMMUNICATION OF NEEDS Skin   Extensive Assist Verbally Normal                       Personal Care Assistance Level of Assistance  Bathing, Feeding, Dressing Bathing Assistance: Limited assistance Feeding assistance: Limited assistance Dressing Assistance: Limited assistance     Functional Limitations Info  Sight, Hearing, Speech Sight Info: Impaired Hearing Info: Adequate Speech Info: Adequate    SPECIAL CARE FACTORS FREQUENCY  PT (By licensed PT), OT (By licensed OT)     PT Frequency: 5x/week OT Frequency: 5x/week            Contractures Contractures Info: Not present    Additional Factors Info  Code Status, Allergies, Psychotropic Code Status Info: Full Allergies Info: Clindamycin/lincomycin, Sulfonamide Derivatives, Warfarin Sodium, Cefzil Cefprozil, Tramadol, Carvedilol, Celecoxib, Doxycycline, Gabapentin, Guanfacine Hcl, Lisinopril, Lyrica Pregabalin Psychotropic Info: trazadone         Current Medications (10/20/2017):  This is the current hospital active medication list Current Facility-Administered Medications  Medication Dose Route Frequency Provider Last Rate Last Dose  . acetaminophen (TYLENOL) tablet 650 mg  650 mg Oral Q6H PRN Ivor Costa, MD       Or  . acetaminophen (TYLENOL) suppository 650 mg  650 mg Rectal Q6H PRN Ivor Costa, MD      . cefTRIAXone (ROCEPHIN) 1 g in sodium chloride  0.9 % 100 mL IVPB  1 g Intravenous Once Ivor Costa, MD      . clorazepate (TRANXENE) tablet 3.75 mg  3.75 mg Oral TID Ivor Costa, MD   3.75 mg at 10/20/17 1554  . diltiazem (CARDIZEM) tablet 30 mg  30 mg Oral Q8H Ivor Costa, MD   30 mg at 10/20/17 1316  . hydrALAZINE (APRESOLINE) injection 5 mg  5 mg Intravenous Q2H PRN Ivor Costa, MD      . HYDROcodone-acetaminophen (NORCO/VICODIN) 5-325 MG per tablet 1 tablet  1 tablet Oral Q6H PRN Ivor Costa,  MD      . latanoprost (XALATAN) 0.005 % ophthalmic solution 1 drop  1 drop Both Eyes QHS Ivor Costa, MD      . losartan (COZAAR) tablet 100 mg  100 mg Oral Daily Ivor Costa, MD   100 mg at 10/20/17 8250  . multivitamin with minerals tablet 1 tablet  1 tablet Oral Daily Ivor Costa, MD   1 tablet at 10/20/17 0925  . ondansetron (ZOFRAN) tablet 4 mg  4 mg Oral Q6H PRN Ivor Costa, MD       Or  . ondansetron [facility]) injection 4 mg  4 mg Intravenous Q6H PRN Ivor Costa, MD      . pantoprazole (PROTONIX) injection 40 mg  40 mg Intravenous Q12H Ivor Costa, MD   40 mg at 10/20/17 1000  . peg 3350 powder (MOVIPREP) kit 200 g  1 kit Oral Once [place], Amy S, PA-C      . predniSONE (DELTASONE) tablet 15 mg  15 mg Oral BID Ivor Costa, MD   15 mg at 10/20/17 0925  . senna-docusate (Senokot-S) tablet 1 tablet  1 tablet Oral QHS PRN Ivor Costa, MD      . traZODone (DESYREL) tablet 50 mg  50 mg Oral QHS Ivor Costa, MD      . venlafaxine XR (EFFEXOR-XR) 24 hr capsule 150 mg  150 mg Oral Q breakfast Ivor Costa, MD   150 mg at 10/20/17 5397  . zolpidem (AMBIEN) tablet 5 mg  5 mg Oral QHS PRN Ivor Costa, MD   5 mg at 10/20/17 6734     Discharge Medications: Please see discharge summary for a list of discharge medications.  Relevant Imaging Results:  Relevant Lab Results:   Additional Information SSN: [id number]  Estanislado Emms, LCSW

## 2017-10-20 NOTE — Progress Notes (Signed)
Pt placed on Clear Liquid diet per order.

## 2017-10-20 NOTE — Progress Notes (Signed)
RN applied bandage to skin tear on pt's left arm.

## 2017-10-21 ENCOUNTER — Encounter (HOSPITAL_COMMUNITY)
Admission: EM | Disposition: A | Discharge status: Skilled Nursing Facility | Payer: Self-pay | Source: Home / Self Care | Attending: Internal Medicine

## 2017-10-21 DIAGNOSIS — D500 Iron deficiency anemia secondary to blood loss (chronic): Secondary | ICD-10-CM

## 2017-10-21 HISTORY — PX: GIVENS CAPSULE STUDY: SHX5432

## 2017-10-21 LAB — TYPE AND SCREEN
ABO/RH(D): O NEG
ANTIBODY SCREEN: NEGATIVE
Unit division: 0
Unit division: 0

## 2017-10-21 LAB — CBC
HEMATOCRIT: 33.7 % — AB (ref 36.0–46.0)
Hemoglobin: 10.7 g/dL — ABNORMAL LOW (ref 12.0–15.0)
MCH: 27.9 pg (ref 26.0–34.0)
MCHC: 31.8 g/dL (ref 30.0–36.0)
MCV: 87.8 fL (ref 78.0–100.0)
Platelets: 292 10*3/uL (ref 150–400)
RBC: 3.84 MIL/uL — ABNORMAL LOW (ref 3.87–5.11)
RDW: 17.5 % — AB (ref 11.5–15.5)
WBC: 9.3 10*3/uL (ref 4.0–10.5)

## 2017-10-21 LAB — BPAM RBC
BLOOD PRODUCT EXPIRATION DATE: 201905202359
BLOOD PRODUCT EXPIRATION DATE: 201905222359
ISSUE DATE / TIME: 201904220304
ISSUE DATE / TIME: 201904220829
UNIT TYPE AND RH: 9500
Unit Type and Rh: 9500

## 2017-10-21 LAB — GLUCOSE, CAPILLARY: Glucose-Capillary: 111 mg/dL — ABNORMAL HIGH (ref 65–99)

## 2017-10-21 LAB — BASIC METABOLIC PANEL
Anion gap: 11 (ref 5–15)
BUN: 21 mg/dL — AB (ref 6–20)
CALCIUM: 8.4 mg/dL — AB (ref 8.9–10.3)
CO2: 22 mmol/L (ref 22–32)
CREATININE: 1.12 mg/dL — AB (ref 0.44–1.00)
Chloride: 107 mmol/L (ref 101–111)
GFR calc Af Amer: 49 mL/min — ABNORMAL LOW (ref >60)
GFR calc non Af Amer: 43 mL/min — ABNORMAL LOW (ref >60)
GLUCOSE: 157 mg/dL — AB (ref 65–99)
Potassium: 3.7 mmol/L (ref 3.5–5.1)
Sodium: 140 mmol/L (ref 135–145)

## 2017-10-21 SURGERY — IMAGING PROCEDURE, GI TRACT, INTRALUMINAL, VIA CAPSULE
Anesthesia: LOCAL

## 2017-10-21 MED ORDER — SODIUM CHLORIDE 0.9 % IV SOLN
510.0000 mg | Freq: Once | INTRAVENOUS | Status: AC
  Administered 2017-10-21: 510 mg via INTRAVENOUS
  Filled 2017-10-21: qty 17

## 2017-10-21 MED ORDER — LORAZEPAM 0.5 MG PO TABS
0.5000 mg | ORAL_TABLET | Freq: Once | ORAL | Status: AC
  Administered 2017-10-21: 0.5 mg via ORAL
  Filled 2017-10-21: qty 1

## 2017-10-21 SURGICAL SUPPLY — 1 items: TOWEL COTTON PACK 4EA (MISCELLANEOUS) ×4 IMPLANT

## 2017-10-21 NOTE — Clinical Social Work Placement (Signed)
   CLINICAL SOCIAL WORK PLACEMENT  NOTE  Date:  10/21/2017  Patient Details  Name: Kristen Garrison MRN: [id number] Date of Birth: 1926/08/06  Clinical Social Work is seeking post-discharge placement for this patient at the Skilled  Nursing Facility level of care (*CSW will initial, date and re-position this form in  chart as items are completed):  Yes   Patient/family provided with [facility] Clinical Social Work Department's list of facilities offering this level of care within the geographic area requested by the patient (or if unable, by the patient's family).  Yes   Patient/family informed of their freedom to choose among providers that offer the needed level of care, that participate in Medicare, Medicaid or managed care program needed by the patient, have an available bed and are willing to accept the patient.  Yes   Patient/family informed of [facility]'s ownership interest in [facility] and [facility], as well as of the fact that they are under no obligation to receive care at these facilities.  PASRR submitted to EDS on 10/21/17     PASRR number received on       Existing PASRR number confirmed on 10/21/17     FL2 transmitted to all facilities in geographic area requested by pt/family on 10/21/17     FL2 transmitted to all facilities within larger geographic area on       Patient informed that his/her managed care company has contracts with or will negotiate with certain facilities, including the following:            Patient/family informed of bed offers received.  Patient chooses bed at       Physician recommends and patient chooses bed at      Patient to be transferred to   on  .  Patient to be transferred to facility by       Patient family notified on   of transfer.  Name of family member notified:        PHYSICIAN Please sign FL2     Additional Comment:    _______________________________________________ Margarito LinerSarah C Terion Hedman, LCSW 10/21/2017,  1:22 PM

## 2017-10-21 NOTE — Progress Notes (Signed)
PROGRESS NOTE    Kristen Garrison  ZOX:[id number] DOB: 10/10/1926 DOA: 10/19/2017  PCP: [name], MD   Brief Narrative:  84 y.o. female with medical history significant for hypertension, GERD, anxiety, atrial fibrillation on Xarelto, vertigo, syncope, dementia, CKD-3, GIB, anemia, who presented with fall, generalized weakness, dark stool and mild dysuria for past 2 weeks prior to this presentation. No loss of consciousness. No N/V/D. No abdominal pain. In ED, pt was hemodynamically stable. Hgb dropped from 11.9 to 8.0, WBC count was 10.6. UA was positive for nitrates. FOBT was positive. GI has seen the pt in consultation.   Assessment & Plan:   Principal Problem:   Fall - Likely due to symptomatic anemia - PT eval - pending, OT - SNF or 24 hour supervision  Active Problems:   Acute blood loss anemia / Acute upper GI bleed - Pt presented with tarry stools and positive FIBT - Hgb as low as 7.4 - S/P 2 U PRBC transfusion during this hospital stay - Hgb 10.7 today  - Capsule endoscopy done today, interpret results tomorrow - Ordered 1 dose feraheme per GI and they also recommended one more dose in 1 week    Paroxysmal atrial fibrillation (HCC) - CHA2DS2-VASc Score 4 - Holding AC due to bleed - Continue Cardizem for HR control    HTN (hypertension) essential - Continue Cardizem and losartan     UTI (urinary tract infection) - Continue rocephin - Urine cx pending  - Blood cx negative    CKD (chronic kidney disease), stage III (HCC) - Cr stable     DVT prophylaxis: SCD's Code Status: full code  Family Communication: no family at bedside Disposition Plan: home in am when caps endo results are in   Consultants:   GI  Procedures:   2 U PRBC transfusion   Antimicrobials:   None    Subjective: No overnight events.  Objective: Vitals:   10/20/17 1700 10/20/17 2011 10/21/17 0630 10/21/17 0815  BP: (!) 144/77 (!) 154/70 (!) 143/65   Pulse: 82 81 71     Resp: 16 18 20    Temp: 98.6 F (37 C) 98.4 F (36.9 C) 98.3 F (36.8 C)   TempSrc: Oral Oral Oral   SpO2: 97% 96% 100%   Weight:   81.6 kg (180 lb) 81.6 kg (180 lb)  Height:    5\' 4"  (1.626 m)    Intake/Output Summary (Last 24 hours) at 10/21/2017 1324 Last data filed at 10/21/2017 0243 Gross per 24 hour  Intake 0 ml  Output 200 ml  Net -200 ml   Filed Weights   10/20/17 0427 10/21/17 0630 10/21/17 0815  Weight: 76.8 kg (169 lb 6.4 oz) 81.6 kg (180 lb) 81.6 kg (180 lb)    Physical Exam  Constitutional: Appears well-developed and well-nourished. No distress.  CVS: RRR, S1/S2 + Pulmonary: Effort and breath sounds normal, no stridor, rhonchi, wheezes, rales.  Abdominal: Soft. BS +,  no distension, tenderness, rebound or guarding.  Musculoskeletal: Normal range of motion. No tenderness.  Lymphadenopathy: No lymphadenopathy noted, cervical, inguinal. Neuro: Alert. Normal reflexes, muscle tone coordination. No cranial nerve deficit. Skin: Skin is warm and dry. No rash noted. Not diaphoretic.   Psychiatric: Normal mood and affect.    Data Reviewed: I have personally reviewed following labs and imaging studies  CBC: Recent Labs  Lab 10/19/17 1855 10/20/17 0129 10/20/17 0730 10/20/17 1804 10/20/17 1930 10/21/17 0250  WBC 10.6* 9.8 10.3 10.6* 10.1 9.3  NEUTROABS 7.5  --   --   --   --   --  HGB 8.0* 7.4* 9.4* 11.2* 10.7* 10.7*  HCT 27.0* 24.2* 29.8* 36.0 34.2* 33.7*  MCV 88.2 87.4 87.4 88.0 87.9 87.8  PLT 329 314 298 306 289 292   Basic Metabolic Panel: Recent Labs  Lab 10/19/17 1925 10/20/17 0730 10/21/17 0250  NA 139 136 140  K 3.9 3.7 3.7  CL 101 102 107  CO2 25 23 22   GLUCOSE 135* 93 157*  BUN 30* 20 21*  CREATININE 1.24* 1.01* 1.12*  CALCIUM 8.8* 8.1* 8.4*   GFR: Estimated Creatinine Clearance: 35.9 mL/min (A) (by C-G formula based on SCr of 1.12 mg/dL (H)). Liver Function Tests: No results for input(s): AST, ALT, ALKPHOS, BILITOT, PROT, ALBUMIN in  the last 168 hours. No results for input(s): LIPASE, AMYLASE in the last 168 hours. No results for input(s): AMMONIA in the last 168 hours. Coagulation Profile: Recent Labs  Lab 10/20/17 0129  INR 1.47   Cardiac Enzymes: No results for input(s): CKTOTAL, CKMB, CKMBINDEX, TROPONINI in the last 168 hours. BNP (last 3 results) No results for input(s): PROBNP in the last 8760 hours. HbA1C: No results for input(s): HGBA1C in the last 72 hours. CBG: Recent Labs  Lab 10/20/17 0800 10/21/17 0712  GLUCAP 108* 111*   Lipid Profile: No results for input(s): CHOL, HDL, LDLCALC, TRIG, CHOLHDL, LDLDIRECT in the last 72 hours. Thyroid Function Tests: No results for input(s): TSH, T4TOTAL, FREET4, T3FREE, THYROIDAB in the last 72 hours. Anemia Panel: Recent Labs    10/20/17 0129  VITAMINB12 668  FOLATE 32.3  FERRITIN 32  TIBC 322  IRON 22*  RETICCTPCT 4.5*   Urine analysis:    Component Value Date/Time   COLORURINE YELLOW 10/19/2017 2050   APPEARANCEUR CLEAR 10/19/2017 2050   LABSPEC 1.009 10/19/2017 2050   PHURINE 6.0 10/19/2017 2050   GLUCOSEU NEGATIVE 10/19/2017 2050   HGBUR NEGATIVE 10/19/2017 2050   BILIRUBINUR NEGATIVE 10/19/2017 2050   KETONESUR NEGATIVE 10/19/2017 2050   PROTEINUR NEGATIVE 10/19/2017 2050   UROBILINOGEN 0.2 06/03/2013 2046   NITRITE POSITIVE (A) 10/19/2017 2050   LEUKOCYTESUR NEGATIVE 10/19/2017 2050   Sepsis Labs: @LABRCNTIP (procalcitonin:4,lacticidven:4)   ) Recent Results (from the past 240 hour(s))  Culture, blood (Routine X 2) w Reflex to ID Panel     Status: None (Preliminary result)   Collection Time: 10/20/17  1:30 AM  Result Value Ref Range Status   Specimen Description BLOOD RIGHT HAND  Final   Special Requests   Final    BOTTLES DRAWN AEROBIC AND ANAEROBIC Blood Culture adequate volume   Culture   Final    NO GROWTH 1 DAY Performed at [facility], [address] [place], [place] [address]    Report Status PENDING   Incomplete  Culture, blood (Routine X 2) w Reflex to ID Panel     Status: None (Preliminary result)   Collection Time: 10/20/17  1:49 AM  Result Value Ref Range Status   Specimen Description BLOOD LEFT HAND  Final   Special Requests   Final    BOTTLES DRAWN AEROBIC ONLY Blood Culture adequate volume   Culture   Final    NO GROWTH 1 DAY Performed at [facility], [address], [place], [place] [address]    Report Status PENDING  Incomplete      Radiology Studies: Ct Head Wo Contrast  Result Date: 10/19/2017 CLINICAL DATA:  Fall, hit head EXAM: CT HEAD WITHOUT CONTRAST TECHNIQUE: Contiguous axial images were obtained from the base of the skull  through the vertex without intravenous contrast. COMPARISON:  06/02/2016 FINDINGS: Brain: There is atrophy and chronic small vessel disease changes. Specifically, no hemorrhage, mass lesion, acute infarction, or significant intracranial injury. Ventriculomegaly seems out of proportion for degree of atrophy. A component of normal pressure hydrocephalus cannot be excluded. No change. 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 Vascular: No hyperdense vessel or unexpected calcification. Skull: No acute calvarial abnormality. Sinuses/Orbits: Visualized paranasal sinuses and mastoids clear. Orbital soft tissues unremarkable. Other: None IMPRESSION: Atrophy, chronic microvascular disease. Ventriculomegaly may be related to ex vacuo dilatation although normal pressure hydrocephalus cannot be excluded. Findings are stable. Electronically Signed   By: Charlett NoseKevin  Dover M.D.   On: 10/19/2017 23:03      Scheduled Meds: . clorazepate  3.75 mg Oral TID  . diltiazem  30 mg Oral Q8H  . latanoprost  1 drop Both Eyes QHS  . losartan  100 mg Oral Daily  . multivitamin with minerals  1 tablet Oral Daily  . pantoprazole  40 mg Intravenous Q12H  . predniSONE  15 mg Oral BID  . traZODone  50 mg Oral QHS  . venlafaxine XR  150 mg Oral Q breakfast    Continuous Infusions:    LOS: 2 days    Time spent: 25 minutes Greater than 50% of the time spent on counseling and coordinating the care.   Manson PasseyAlma Johann Santone, MD [facility] Pager [phone]  If 7PM-7AM, please contact night-coverage [email or web address] Password [email or web address] 10/21/2017, 1:24 PM

## 2017-10-21 NOTE — Progress Notes (Signed)
Patient ID: Kristen Garrison, female   DOB: 1927-05-09, 84 y.o.   MRN: [id number]     Progress Note   Subjective   Patient ingested the capsule earlier this morning without difficulty.  No complaints currently, specifically no abdominal discomfort ,nausea.-No active bleeding Hemoglobin up to 10.7 post transfusions   Objective   Vital signs in last 24 hours: Temp:  [98.3 F (36.8 C)-98.6 F (37 C)] 98.3 F (36.8 C) (04/23 0630) Pulse Rate:  [71-83] 71 (04/23 0630) Resp:  [16-20] 20 (04/23 0630) BP: (143-154)/(65-77) 143/65 (04/23 0630) SpO2:  [96 %-100 %] 100 % (04/23 0630) Weight:  [180 lb (81.6 kg)] 180 lb (81.6 kg) (04/23 0815) Last BM Date: 10/20/17 General: Elderly white female in NAD Heart:  Regular rate and rhythm; no murmurs Lungs: Respirations even and unlabored, lungs CTA bilaterally Abdomen:  Soft, nontender and nondistended. Normal bowel sounds. Extremities:  Without edema.  Multiple ecchymoses Neurologic:  Alert and oriented,  grossly normal neurologically. Psych:  Cooperative. Normal mood and affect.  Intake/Output from previous day: 04/22 0701 - 04/23 0700 In: 460 [I.V.:50; Blood:410] Out: 600 [Urine:600] Intake/Output this shift: No intake/output data recorded.  Lab Results: Recent Labs    10/20/17 1804 10/20/17 1930 10/21/17 0250  WBC 10.6* 10.1 9.3  HGB 11.2* 10.7* 10.7*  HCT 36.0 34.2* 33.7*  PLT 306 289 292   BMET Recent Labs    10/19/17 1925 10/20/17 0730 10/21/17 0250  NA 139 136 140  K 3.9 3.7 3.7  CL 101 102 107  CO2 25 23 22   GLUCOSE 135* 93 157*  BUN 30* 20 21*  CREATININE 1.24* 1.01* 1.12*  CALCIUM 8.8* 8.1* 8.4*   LFT No results for input(s): PROT, ALBUMIN, AST, ALT, ALKPHOS, BILITOT, BILIDIR, IBILI in the last 72 hours. PT/INR Recent Labs    10/20/17 0129  LABPROT 17.7*  INR 1.47    Studies/Results: Ct Head Wo Contrast  Result Date: 10/19/2017 CLINICAL DATA:  Fall, hit head EXAM: CT HEAD WITHOUT CONTRAST  TECHNIQUE: Contiguous axial images were obtained from the base of the skull through the vertex without intravenous contrast. COMPARISON:  06/02/2016 FINDINGS: Brain: There is atrophy and chronic small vessel disease changes. Specifically, no hemorrhage, mass lesion, acute infarction, or significant intracranial injury. Ventriculomegaly seems out of proportion for degree of atrophy. Kristen component of normal pressure hydrocephalus cannot be excluded. No change. 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 Vascular: No hyperdense vessel or unexpected calcification. Skull: No acute calvarial abnormality. Sinuses/Orbits: Visualized paranasal sinuses and mastoids clear. Orbital soft tissues unremarkable. Other: None IMPRESSION: Atrophy, chronic microvascular disease. Ventriculomegaly may be related to ex vacuo dilatation although normal pressure hydrocephalus cannot be excluded. Findings are stable. Electronically Signed   By: Charlett NoseKevin  Dover M.D.   On: 10/19/2017 23:03       Assessment / Plan:    #101 84 year old white female with history of chronic anemia,, iron deficient, admitted after Kristen fall at home and found to have drop in hemoglobin and heme positive stool. Patient had admission about Kristen month ago with very similar presentation, required transfusions, and underwent EGD and colonoscopy in March 2019 both of which were unrevealing as to source of blood loss  Patient is undergoing capsule endoscopy today.   #2 atrial fibrillation #3 tonic anticoagulation-on Xarelto- on hold #4 iron deficiency-we will replace with IV iron #5 chronic kidney disease stage III   #6 mild dementia   #7 chronic GERD #8.  Osteoarthritis-Tylenol is helping her pain  Plan; Complete capsule endoscopy today, this can be interpreted tomorrow Advance to regular diet at lunchtime Continue serial hemoglobins and transfuse as indicated Continue daily PPI Will order IV Feraheme x1 today, she would benefit from second dose  in 1 week    Contact  Jacoby Zanni, P.Kristen.-C               [phone]      Principal Problem:   Fall Active Problems:   Esophageal reflux   Atrial fibrillation (HCC)   HTN (hypertension)   UTI (urinary tract infection)   Heme positive stool   Orthostatic hypotension   Normocytic anemia   CKD (chronic kidney disease), stage III (HCC)   Acute blood loss anemia   Anxiety     LOS: 2 days   Garreth Burnsworth  10/21/2017, 9:54 AM

## 2017-10-21 NOTE — NC FL2 (Signed)
[place] MEDICAID FL2 LEVEL OF CARE SCREENING TOOL     IDENTIFICATION  Patient Name: Kristen Garrison Birthdate: 04-29-1927 Sex: female Admission Date (Current Location): 10/19/2017  [facility] [place]   Facility and Address:  [facility] [place], [address], [place], [place] [address]      Provider Number: [id number]  Attending Physician Name and Address:  Alison Murrayevine, Alma M, MD  Relative Name and Phone Number:       Current Level of Care: Hospital Recommended Level of Care: Skilled Nursing Facility Prior Approval Number:    Date Approved/Denied:   PASRR Number: [id number]  Discharge Plan: SNF    Current Diagnoses: Patient Active Problem List   Diagnosis Date Noted  . Fall 10/19/2017  . Orthostatic hypotension 10/19/2017  . Normocytic anemia 10/19/2017  . CKD (chronic kidney disease), stage III (HCC) 10/19/2017  . Acute blood loss anemia 10/19/2017  . Anxiety 10/19/2017  . Acute UTI   . Symptomatic anemia   . Heme positive stool   . Melena   . Hiatal hernia   . Esophageal ring   . Anemia 09/07/2017  . S/P total knee arthroplasty   . Arterial hypotension   . Elevated troponin   . Failed total right knee replacement (HCC) 01/12/2016  . Back pain 07/28/2014  . Weakness of both lower extremities 07/28/2014  . Lumbosacral radiculopathy at S1 05/25/2014  . Cardiac device in situ 02/22/2014  . Adequate anticoagulation on anticoagulant therapy 09/01/2013  . Atrial fibrillation (HCC) 06/04/2013  . HTN (hypertension) 06/04/2013  . UTI (urinary tract infection) 06/04/2013  . Contusion of right elbow and forearm 06/04/2013  . Syncope 06/03/2013  . Esophageal reflux 04/01/2008  . DIVERTICULOSIS OF COLON 04/01/2008  . DYSPHAGIA UNSPECIFIED 04/01/2008    Orientation RESPIRATION BLADDER Height & Weight     Self, Time, Situation, Place  Normal Continent, External catheter Weight: 180 lb (81.6 kg) Height:  5\' 4"  (162.6 cm)   BEHAVIORAL SYMPTOMS/MOOD NEUROLOGICAL BOWEL NUTRITION STATUS      Continent Diet(please see DC summary)  AMBULATORY STATUS COMMUNICATION OF NEEDS Skin   Extensive Assist Verbally Normal                       Personal Care Assistance Level of Assistance  Bathing, Feeding, Dressing Bathing Assistance: Limited assistance Feeding assistance: Limited assistance Dressing Assistance: Limited assistance     Functional Limitations Info  Sight, Hearing, Speech Sight Info: Impaired Hearing Info: Adequate Speech Info: Adequate    SPECIAL CARE FACTORS FREQUENCY  PT (By licensed PT), OT (By licensed OT)     PT Frequency: 5x/week OT Frequency: 5x/week            Contractures Contractures Info: Not present    Additional Factors Info  Code Status, Allergies, Psychotropic Code Status Info: Full Allergies Info: Clindamycin/lincomycin, Sulfonamide Derivatives, Warfarin Sodium, Cefzil Cefprozil, Tramadol, Carvedilol, Celecoxib, Doxycycline, Gabapentin, Guanfacine Hcl, Lisinopril, Lyrica Pregabalin Psychotropic Info: trazadone         Current Medications (10/21/2017):  This is the current hospital active medication list Current Facility-Administered Medications  Medication Dose Route Frequency Provider Last Rate Last Dose  . acetaminophen (TYLENOL) tablet 650 mg  650 mg Oral Q6H PRN Lorretta HarpNiu, Xilin, MD   650 mg at 10/21/17 [id number]   Or  . acetaminophen (TYLENOL) suppository 650 mg  650 mg Rectal Q6H PRN Lorretta HarpNiu, Xilin, MD      . clorazepate (TRANXENE) tablet 3.75 mg  3.75 mg Oral TID Lorretta Harp, MD   3.75 mg at 10/20/17 2138  . diltiazem (CARDIZEM) tablet 30 mg  30 mg Oral Q8H Lorretta Harp, MD   30 mg at 10/21/17 9562  . hydrALAZINE (APRESOLINE) injection 5 mg  5 mg Intravenous Q2H PRN Lorretta Harp, MD      . HYDROcodone-acetaminophen (NORCO/VICODIN) 5-325 MG per tablet 1 tablet  1 tablet Oral Q6H PRN Lorretta Harp, MD      . latanoprost (XALATAN) 0.005 % ophthalmic solution 1 drop  1 drop Both Eyes  QHS Lorretta Harp, MD   1 drop at 10/20/17 2139  . losartan (COZAAR) tablet 100 mg  100 mg Oral Daily Lorretta Harp, MD   100 mg at 10/20/17 1308  . multivitamin with minerals tablet 1 tablet  1 tablet Oral Daily Lorretta Harp, MD   1 tablet at 10/20/17 0925  . ondansetron (ZOFRAN) tablet 4 mg  4 mg Oral Q6H PRN Lorretta Harp, MD       Or  . ondansetron [employer]) injection 4 mg  4 mg Intravenous Q6H PRN Lorretta Harp, MD      . pantoprazole (PROTONIX) injection 40 mg  40 mg Intravenous Q12H Lorretta Harp, MD   40 mg at 10/21/17 1208  . predniSONE (DELTASONE) tablet 15 mg  15 mg Oral BID Lorretta Harp, MD   15 mg at 10/20/17 2137  . senna-docusate (Senokot-S) tablet 1 tablet  1 tablet Oral QHS PRN Lorretta Harp, MD      . traZODone (DESYREL) tablet 50 mg  50 mg Oral QHS Lorretta Harp, MD   50 mg at 10/20/17 2137  . venlafaxine XR (EFFEXOR-XR) 24 hr capsule 150 mg  150 mg Oral Q breakfast Lorretta Harp, MD   150 mg at 10/20/17 6578  . zolpidem (AMBIEN) tablet 5 mg  5 mg Oral QHS PRN Lorretta Harp, MD   5 mg at 10/20/17 4696     Discharge Medications: Please see discharge summary for a list of discharge medications.  Relevant Imaging Results:  Relevant Lab Results:   Additional Information SSN: [id number]  Margarito Liner, LCSW

## 2017-10-21 NOTE — Clinical Social Work Note (Signed)
Clinical Social Work Assessment  Patient Details  Name: Kristen Garrison MRN: [id number] Date of Birth: [birth year]  Date of referral:  10/21/17               Reason for consult:  Facility Placement, Discharge Planning                Permission sought to share information with:  Facility Sport and exercise psychologist, Family Supports Permission granted to share information::  Yes, Verbal Permission Granted  Name::     Rachella Basden  Agency::  SNF's  Relationship::  Husband  Contact Information:  [phone]  Housing/Transportation Living arrangements for the past 2 months:  [place] of Information:  Patient, Medical Team, Spouse, Other (Comment Required)(Caregiver) Patient Interpreter Needed:  None Criminal Activity/Legal Involvement Pertinent to Current Situation/Hospitalization:  No - Comment as needed Significant Relationships:  Adult Children, Spouse, Other(Comment)(Caregiver) Lives with:  Spouse Do you feel safe going back to the place where you live?  Yes Need for family participation in patient care:  Yes (Comment)  Care giving concerns:  PT recommending SNF once medically stable for discharge.   Social Worker assessment / plan:  CSW met with patient. Husband and caregiver at bedside. CSW introduced role and explained that PT recommendations would be discussed. Patient is not sure if she wants SNF placement or not and wants to see how she progresses in PT before making decision. Patient states no one has gotten her up to walk. CSW made patient aware that only [place] SNF's take [employer] Medicare: [facility], [facility], [facility], and [facility]. Patient has been to [facility] in the past but appears to have had a different insurance at that time. Patient is agreeable to CSW sending out referral to see what her options are. No further concerns. CSW encouraged patient and her husband and caregiver to contact CSW as needed. CSW will continue  to follow patient and her family for support and facilitate discharge to SNF, if agreeable, once medically stable.  Employment status:  Retired Nurse, adult PT Recommendations:  [place] / Referral to community resources:  [place]  Patient/Family's Response to care:  Patient wants to see how she does before deciding on SNF vs. Home. Patient's husband, son, and caregiver supportive and involved in patient's care. Patient and her husband and caregiver appreciated social work intervention.  Patient/Family's Understanding of and Emotional Response to Diagnosis, Current Treatment, and Prognosis:  Patient and her husband and caregiver have a good understanding of the reason for admission and PT recommendations. Patient and her husband and caregiver appear pleased with hospital care.  Emotional Assessment Appearance:  Appears stated age Attitude/Demeanor/Rapport:  Engaged Affect (typically observed):  Appropriate, Calm Orientation:  Oriented to Self, Oriented to Place, Oriented to  Time, Oriented to Situation Alcohol / Substance use:  Never Used Psych involvement (Current and /or in the community):  No (Comment)  Discharge Needs  Concerns to be addressed:  Care Coordination Readmission within the last 30 days:  No Current discharge risk:  Dependent with Mobility Barriers to Discharge:  Ship broker, Continued Medical Work up   Candie Chroman, LCSW 10/21/2017, 1:18 PM

## 2017-10-21 NOTE — Care Management Obs Status (Signed)
MEDICARE OBSERVATION STATUS NOTIFICATION   Patient Details  Name: Kristen Garrison MRN: [id number] Date of Birth: 06/18/1927   Medicare Observation Status Notification Given:  Other (see comment)(pt refused to sign)    Cherrie Distancehandler, [place] L, RN 10/21/2017, 10:00 AM

## 2017-10-21 NOTE — Clinical Social Work Note (Signed)
Patient and her husband have not decided if they want SNF or not. Bed offers provided.  Charlynn CourtSarah Chaniya Genter, CSW [phone]

## 2017-10-21 NOTE — Care Management CC44 (Signed)
Condition Code 44 Documentation Completed  Patient Details  Name: Kristen Garrison MRN: [id number] Date of Birth: 1927-02-06   Condition Code 44 given:  Yes(pt refused to sign) Patient signature on Condition Code 44 notice:  (pt refused to sign) Documentation of 2 MD's agreement:  Yes Code 44 added to claim:  Yes    Cherrie DistanceChandler, Kairyn Olmeda L, RN 10/21/2017, 10:00 AM

## 2017-10-21 NOTE — Progress Notes (Addendum)
Patient ingested givens capsule for small bowel endoscopy 10/21/17 at 0825. Patient is to remain NPO for 2 hours following capsule ingestion. Written and verbal instructions given to patient and primary RN and reviewed.

## 2017-10-22 ENCOUNTER — Encounter (HOSPITAL_COMMUNITY): Payer: Self-pay | Admitting: Internal Medicine

## 2017-10-22 DIAGNOSIS — K259 Gastric ulcer, unspecified as acute or chronic, without hemorrhage or perforation: Secondary | ICD-10-CM

## 2017-10-22 LAB — GLUCOSE, CAPILLARY: Glucose-Capillary: 126 mg/dL — ABNORMAL HIGH (ref 65–99)

## 2017-10-22 LAB — CBC
HEMATOCRIT: 36.7 % (ref 36.0–46.0)
Hemoglobin: 11.4 g/dL — ABNORMAL LOW (ref 12.0–15.0)
MCH: 27.5 pg (ref 26.0–34.0)
MCHC: 31.1 g/dL (ref 30.0–36.0)
MCV: 88.4 fL (ref 78.0–100.0)
PLATELETS: 302 10*3/uL (ref 150–400)
RBC: 4.15 MIL/uL (ref 3.87–5.11)
RDW: 17.5 % — AB (ref 11.5–15.5)
WBC: 9.8 10*3/uL (ref 4.0–10.5)

## 2017-10-22 MED ORDER — PANTOPRAZOLE SODIUM 40 MG PO TBEC
40.0000 mg | DELAYED_RELEASE_TABLET | Freq: Every day | ORAL | Status: DC
  Administered 2017-10-23 – 2017-10-24 (×2): 40 mg via ORAL
  Filled 2017-10-22 (×2): qty 1

## 2017-10-22 NOTE — Plan of Care (Signed)
  Problem: Pain Managment: Goal: General experience of comfort will improve Outcome: Progressing   Problem: Safety: Goal: Ability to remain free from injury will improve Outcome: Progressing   Problem: Skin Integrity: Goal: Risk for impaired skin integrity will decrease Outcome: Progressing   

## 2017-10-22 NOTE — Progress Notes (Signed)
PROGRESS NOTE    Kristen Garrison  ZOX:[id number] DOB: 1927-06-18 DOA: 10/19/2017  PCP: Nila NephewGreen, Edwin, MD   Brief Narrative:  84 y.o. female with medical history significant for hypertension, GERD, anxiety, atrial fibrillation on Xarelto, vertigo, syncope, dementia, CKD-3, GIB, anemia, who presented with fall, generalized weakness, dark stool and mild dysuria for past 2 weeks prior to this presentation. No loss of consciousness. No N/V/D. No abdominal pain. In ED, pt was hemodynamically stable. Hgb dropped from 11.9 to 8.0, WBC count was 10.6. UA was positive for nitrates. FOBT was positive. GI has seen the pt in consultation.   Assessment & Plan:   Principal Problem:   Fall - Likely due to symptomatic anemia - PT eval - pending, OT - SNF or 24 hour supervision  Active Problems:   Acute blood loss anemia / Acute upper GI bleed - Pt presented with tarry stools and positive FIBT - Hgb as low as 7.4 - S/P 2 U PRBC transfusion during this hospital stay - Capsule endoscopy done 4/22, follow up results today - Ordered 1 dose feraheme per GI and they also recommended one more dose in 1 week    Paroxysmal atrial fibrillation (HCC) - CHA2DS2-VASc Score 4 - Holding AC due to bleed - Cardizem for HR control     HTN (hypertension) essential - Continue Cardizem and losartan     UTI (urinary tract infection) - Continue rocephin - Blood cx negative    CKD (chronic kidney disease), stage III (HCC) - Cr stable     DVT prophylaxis: SCD's Code Status: full code  Family Communication: no family at bedside Disposition Plan: awaiting capsule endo results    Consultants:   GI  Procedures:   2 U PRBC transfusion   Antimicrobials:   None    Subjective: No overnight events.  Objective: Vitals:   10/21/17 2210 10/22/17 0500 10/22/17 0845 10/22/17 1120  BP: (!) 150/64 (!) 173/80 (!) 180/78 (!) 163/74  Pulse:  82 87   Resp:  18 18   Temp:  98 F (36.7 C) 98 F (36.7 C)     TempSrc:  Oral Oral   SpO2:  98% 100%   Weight:  81.3 kg (179 lb 3.2 oz)    Height:        Intake/Output Summary (Last 24 hours) at 10/22/2017 1248 Last data filed at 10/22/2017 0911 Gross per 24 hour  Intake 360 ml  Output 1750 ml  Net -1390 ml   Filed Weights   10/21/17 0630 10/21/17 0815 10/22/17 0500  Weight: 81.6 kg (180 lb) 81.6 kg (180 lb) 81.3 kg (179 lb 3.2 oz)    Physical Exam  Constitutional: Appears well-developed and well-nourished. No distress.   CVS: RRR, S1/S2 + Pulmonary: Effort and breath sounds normal, no stridor, rhonchi, wheezes, rales.  Abdominal: Soft. BS +,  no distension, tenderness Musculoskeletal: Normal range of motion. No edema and no tenderness.  Lymphadenopathy: No lymphadenopathy noted, cervical, inguinal. Neuro: Alert. Normal reflexes, muscle tone coordination. No cranial nerve deficit. Skin: Skin is warm and dry.  Psychiatric: Normal mood and affect. Behavior, judgment, thought content normal.      Data Reviewed: I have personally reviewed following labs and imaging studies  CBC: Recent Labs  Lab 10/19/17 1855  10/20/17 0730 10/20/17 1804 10/20/17 1930 10/21/17 0250 10/22/17 0427  WBC 10.6*   < > 10.3 10.6* 10.1 9.3 9.8  NEUTROABS 7.5  --   --   --   --   --   --  HGB 8.0*   < > 9.4* 11.2* 10.7* 10.7* 11.4*  HCT 27.0*   < > 29.8* 36.0 34.2* 33.7* 36.7  MCV 88.2   < > 87.4 88.0 87.9 87.8 88.4  PLT 329   < > 298 306 289 292 302   < > = values in this interval not displayed.   Basic Metabolic Panel: Recent Labs  Lab 10/19/17 1925 10/20/17 0730 10/21/17 0250  NA 139 136 140  K 3.9 3.7 3.7  CL 101 102 107  CO2 25 23 22   GLUCOSE 135* 93 157*  BUN 30* 20 21*  CREATININE 1.24* 1.01* 1.12*  CALCIUM 8.8* 8.1* 8.4*   GFR: Estimated Creatinine Clearance: 35.8 mL/min (A) (by C-G formula based on SCr of 1.12 mg/dL (H)). Liver Function Tests: No results for input(s): AST, ALT, ALKPHOS, BILITOT, PROT, ALBUMIN in the last 168  hours. No results for input(s): LIPASE, AMYLASE in the last 168 hours. No results for input(s): AMMONIA in the last 168 hours. Coagulation Profile: Recent Labs  Lab 10/20/17 0129  INR 1.47   Cardiac Enzymes: No results for input(s): CKTOTAL, CKMB, CKMBINDEX, TROPONINI in the last 168 hours. BNP (last 3 results) No results for input(s): PROBNP in the last 8760 hours. HbA1C: No results for input(s): HGBA1C in the last 72 hours. CBG: Recent Labs  Lab 10/20/17 0800 10/21/17 0712 10/22/17 0740  GLUCAP 108* 111* 126*   Lipid Profile: No results for input(s): CHOL, HDL, LDLCALC, TRIG, CHOLHDL, LDLDIRECT in the last 72 hours. Thyroid Function Tests: No results for input(s): TSH, T4TOTAL, FREET4, T3FREE, THYROIDAB in the last 72 hours. Anemia Panel: Recent Labs    10/20/17 0129  VITAMINB12 668  FOLATE 32.3  FERRITIN 32  TIBC 322  IRON 22*  RETICCTPCT 4.5*   Urine analysis:    Component Value Date/Time   COLORURINE YELLOW 10/19/2017 2050   APPEARANCEUR CLEAR 10/19/2017 2050   LABSPEC 1.009 10/19/2017 2050   PHURINE 6.0 10/19/2017 2050   GLUCOSEU NEGATIVE 10/19/2017 2050   HGBUR NEGATIVE 10/19/2017 2050   BILIRUBINUR NEGATIVE 10/19/2017 2050   KETONESUR NEGATIVE 10/19/2017 2050   PROTEINUR NEGATIVE 10/19/2017 2050   UROBILINOGEN 0.2 06/03/2013 2046   NITRITE POSITIVE (A) 10/19/2017 2050   LEUKOCYTESUR NEGATIVE 10/19/2017 2050   Sepsis Labs: @LABRCNTIP (procalcitonin:4,lacticidven:4)   ) Recent Results (from the past 240 hour(s))  Culture, blood (Routine X 2) w Reflex to ID Panel     Status: None (Preliminary result)   Collection Time: 10/20/17  1:30 AM  Result Value Ref Range Status   Specimen Description BLOOD RIGHT HAND  Final   Special Requests   Final    BOTTLES DRAWN AEROBIC AND ANAEROBIC Blood Culture adequate volume   Culture   Final    NO GROWTH 1 DAY Performed at [facility], [address] [place], [place] [address]    Report Status PENDING   Incomplete  Culture, blood (Routine X 2) w Reflex to ID Panel     Status: None (Preliminary result)   Collection Time: 10/20/17  1:49 AM  Result Value Ref Range Status   Specimen Description BLOOD LEFT HAND  Final   Special Requests   Final    BOTTLES DRAWN AEROBIC ONLY Blood Culture adequate volume   Culture   Final    NO GROWTH 1 DAY Performed at [facility], [address], [place], [place] [address]    Report Status PENDING  Incomplete      Radiology Studies: Ct  Head Wo Contrast  Result Date: 10/19/2017 CLINICAL DATA:  Fall, hit head EXAM: CT HEAD WITHOUT CONTRAST TECHNIQUE: Contiguous axial images were obtained from the base of the skull through the vertex without intravenous contrast. COMPARISON:  06/02/2016 FINDINGS: Brain: There is atrophy and chronic small vessel disease changes. Specifically, no hemorrhage, mass lesion, acute infarction, or significant intracranial injury. Ventriculomegaly seems out of proportion for degree of atrophy. A component of normal pressure hydrocephalus cannot be excluded. No change. 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 Vascular: No hyperdense vessel or unexpected calcification. Skull: No acute calvarial abnormality. Sinuses/Orbits: Visualized paranasal sinuses and mastoids clear. Orbital soft tissues unremarkable. Other: None IMPRESSION: Atrophy, chronic microvascular disease. Ventriculomegaly may be related to ex vacuo dilatation although normal pressure hydrocephalus cannot be excluded. Findings are stable. Electronically Signed   By: Charlett Nose M.D.   On: 10/19/2017 23:03      Scheduled Meds: . clorazepate  3.75 mg Oral TID  . diltiazem  30 mg Oral Q8H  . latanoprost  1 drop Both Eyes QHS  . losartan  100 mg Oral Daily  . multivitamin with minerals  1 tablet Oral Daily  . pantoprazole  40 mg Intravenous Q12H  . predniSONE  15 mg Oral BID  . traZODone  50 mg Oral QHS  . venlafaxine XR  150 mg Oral Q breakfast    Continuous Infusions:    LOS: 3 days    Time spent: 25 minutes Greater than 50% of the time spent on counseling and coordinating the care.   Manson Passey, MD [facility] Pager [phone]  If 7PM-7AM, please contact night-coverage [email or web address] Password [facility] 10/22/2017, 12:48 PM

## 2017-10-22 NOTE — Progress Notes (Signed)
Daily Rounding Note  10/22/2017, 10:46 AM  LOS: 2 days   SUBJECTIVE:      Brown stools.  No abd pain.  No nausea, vomiting.  Eating solid foods.    OBJECTIVE:         Vital signs in last 24 hours:    Temp:  [98 F (36.7 C)-98.3 F (36.8 C)] 98 F (36.7 C) (04/24 0845) Pulse Rate:  [75-87] 87 (04/24 0845) Resp:  [16-18] 18 (04/24 0845) BP: (149-180)/(59-80) 180/78 (04/24 0845) SpO2:  [95 %-100 %] 100 % (04/24 0845) Weight:  [179 lb 3.2 oz (81.3 kg)] 179 lb 3.2 oz (81.3 kg) (04/24 0500) Last BM Date: 10/21/17 Filed Weights   10/21/17 0630 10/21/17 0815 10/22/17 0500  Weight: 180 lb (81.6 kg) 180 lb (81.6 kg) 179 lb 3.2 oz (81.3 kg)   General: chronically ill, cushingoid,    Heart: RRR Chest: clear bil.  No cough, no SOB Abdomen: soft, obese, NT.  Active BS  Extremities: no CCE Neuro/Psych:  Alert, oriented x 3.  No limb weakness or tremor.     Intake/Output from previous day: 04/23 0701 - 04/24 0700 In: 120 [P.O.:120] Out: 1100 [Urine:1100]  Intake/Output this shift: Total I/O In: 240 [P.O.:240] Out: 650 [Urine:650]  Lab Results: Recent Labs    10/20/17 1930 10/21/17 0250 10/22/17 0427  WBC 10.1 9.3 9.8  HGB 10.7* 10.7* 11.4*  HCT 34.2* 33.7* 36.7  PLT 289 292 302   BMET Recent Labs    10/19/17 1925 10/20/17 0730 10/21/17 0250  NA 139 136 140  K 3.9 3.7 3.7  CL 101 102 107  CO2 25 23 22   GLUCOSE 135* 93 157*  BUN 30* 20 21*  CREATININE 1.24* 1.01* 1.12*  CALCIUM 8.8* 8.1* 8.4*   LFT No results for input(s): PROT, ALBUMIN, AST, ALT, ALKPHOS, BILITOT, BILIDIR, IBILI in the last 72 hours. PT/INR Recent Labs    10/20/17 0129  LABPROT 17.7*  INR 1.47   Hepatitis Panel No results for input(s): HEPBSAG, HCVAB, HEPAIGM, HEPBIGM in the last 72 hours.  Studies/Results: No results found.   Scheduled Meds: . clorazepate  3.75 mg Oral TID  . diltiazem  30 mg Oral Q8H  . latanoprost  1  drop Both Eyes QHS  . losartan  100 mg Oral Daily  . multivitamin with minerals  1 tablet Oral Daily  . pantoprazole  40 mg Intravenous Q12H  . predniSONE  15 mg Oral BID  . traZODone  50 mg Oral QHS  . venlafaxine XR  150 mg Oral Q breakfast   Continuous Infusions: PRN Meds:.acetaminophen **OR** acetaminophen, hydrALAZINE, HYDROcodone-acetaminophen, ondansetron **OR** ondansetron (ZOFRAN) IV, senna-docusate, zolpidem    ASSESMENT:   *   Acute on chronic anemia.  Iron Def.   FOBT +.  S/p PRBC x 2 U.  Feraheme on 4/23.   Hgb remains stable.    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   09/09/17 Colonoscopy: unremarkable.  No source for blood loss.    10/21/17 Capsule endoscopy.  Erosions in the pylorus.  Some scattered, tiny, red spots some of these in the terminal ileum, possibly representing tiny AVMs.  No active bleeding..    *   A fib, chronic Xarelto on hold.   Wonder if the tiny red spots seen on capsule Endo may have actively bled if she was taking Xarelto at the time of the capsule endoscopy.  *  CKD 3   *  Chronic Prednisone 15 mg BID, for unclear reason.      PLAN   *   Switch off IV Protonix onto oral 1 x daily PPI.    *  No plans for further GI workup/studies.    *  Consult with cardiology as to necessity for Xarelto      Jennye Moccasin  10/22/2017, 10:46 AM Phone [phone]

## 2017-10-23 LAB — GLUCOSE, CAPILLARY: Glucose-Capillary: 123 mg/dL — ABNORMAL HIGH (ref 65–99)

## 2017-10-23 MED ORDER — RIVAROXABAN 15 MG PO TABS: 15.0000 mg | ORAL_TABLET | Freq: Every day | ORAL | 0 refills | Status: DC

## 2017-10-23 NOTE — Progress Notes (Signed)
Pt family at bedside requesting to speak to social worker, called Maralyn SagoSarah, SW aware   MD called stated to delete discharge order

## 2017-10-23 NOTE — Discharge Instructions (Signed)
Rivaroxaban oral tablets °What is this medicine? °RIVAROXABAN (ri va ROX a ban) is an anticoagulant (blood thinner). It is used to treat blood clots in the lungs or in the veins. It is also used after knee or hip surgeries to prevent blood clots. It is also used to lower the chance of stroke in people with a medical condition called atrial fibrillation. °This medicine may be used for other purposes; ask your health care provider or pharmacist if you have questions. °COMMON BRAND NAME(S): Xarelto, Xarelto Starter Pack °What should I tell my health care provider before I take this medicine? °They need to know if you have any of these conditions: °-bleeding disorders °-bleeding in the brain °-blood in your stools (black or tarry stools) or if you have blood in your vomit °-history of stomach bleeding °-kidney disease °-liver disease °-low blood counts, like low white cell, platelet, or red cell counts °-recent or planned spinal or epidural procedure °-take medicines that treat or prevent blood clots °-an unusual or allergic reaction to rivaroxaban, other medicines, foods, dyes, or preservatives °-pregnant or trying to get pregnant °-breast-feeding °How should I use this medicine? °Take this medicine by mouth with a glass of water. Follow the directions on the prescription label. Take your medicine at regular intervals. Do not take it more often than directed. Do not stop taking except on your doctor's advice. Stopping this medicine may increase your risk of a blood clot. Be sure to refill your prescription before you run out of medicine. °If you are taking this medicine after hip or knee replacement surgery, take it with or without food. If you are taking this medicine for atrial fibrillation, take it with your evening meal. If you are taking this medicine to treat blood clots, take it with food at the same time each day. If you are unable to swallow your tablet, you may crush the tablet and mix it in applesauce. Then,  immediately eat the applesauce. You should eat more food right after you eat the applesauce containing the crushed tablet. °Talk to your pediatrician regarding the use of this medicine in children. Special care may be needed. °Overdosage: If you think you have taken too much of this medicine contact a poison control center or emergency room at once. °NOTE: This medicine is only for you. Do not share this medicine with others. °What if I miss a dose? °If you take your medicine once a day and miss a dose, take the missed dose as soon as you remember. If you take your medicine twice a day and miss a dose, take the missed dose immediately. In this instance, 2 tablets may be taken at the same time. The next day you should take 1 tablet twice a day as directed. °What may interact with this medicine? °Do not take this medicine with any of the following medications: °-defibrotide °This medicine may also interact with the following medications: °-aspirin and aspirin-like medicines °-certain antibiotics like erythromycin, azithromycin, and clarithromycin °-certain medicines for fungal infections like ketoconazole and itraconazole °-certain medicines for irregular heart beat like amiodarone, quinidine, dronedarone °-certain medicines for seizures like carbamazepine, phenytoin °-certain medicines that treat or prevent blood clots like warfarin, enoxaparin, and dalteparin °-conivaptan °-diltiazem °-felodipine °-indinavir °-lopinavir; ritonavir °-NSAIDS, medicines for pain and inflammation, like ibuprofen or naproxen °-ranolazine °-rifampin °-ritonavir °-SNRIs, medicines for depression, like desvenlafaxine, duloxetine, levomilnacipran, venlafaxine °-SSRIs, medicines for depression, like citalopram, escitalopram, fluoxetine, fluvoxamine, paroxetine, sertraline °-St. John's wort °-verapamil °This list may not describe all   possible interactions. Give your health care provider a list of all the medicines, herbs, non-prescription  drugs, or dietary supplements you use. Also tell them if you smoke, drink alcohol, or use illegal drugs. Some items may interact with your medicine. °What should I watch for while using this medicine? °Visit your doctor or health care professional for regular checks on your progress. °Notify your doctor or health care professional and seek emergency treatment if you develop breathing problems; changes in vision; chest pain; severe, sudden headache; pain, swelling, warmth in the leg; trouble speaking; sudden numbness or weakness of the face, arm or leg. These can be signs that your condition has gotten worse. °If you are going to have surgery or other procedure, tell your doctor that you are taking this medicine. °What side effects may I notice from receiving this medicine? °Side effects that you should report to your doctor or health care professional as soon as possible: °-allergic reactions like skin rash, itching or hives, swelling of the face, lips, or tongue °-back pain °-redness, blistering, peeling or loosening of the skin, including inside the mouth °-signs and symptoms of bleeding such as bloody or black, tarry stools; red or dark-brown urine; spitting up blood or brown material that looks like coffee grounds; red spots on the skin; unusual bruising or bleeding from the eye, gums, or nose °Side effects that usually do not require medical attention (report to your doctor or health care professional if they continue or are bothersome): °-dizziness °-muscle pain °This list may not describe all possible side effects. Call your doctor for medical advice about side effects. You may report side effects to FDA at 1-800-FDA-1088. °Where should I keep my medicine? °Keep out of the reach of children. °Store at room temperature between 15 and 30 degrees C (59 and 86 degrees F). Throw away any unused medicine after the expiration date. °NOTE: This sheet is a summary. It may not cover all possible information. If you  have questions about this medicine, talk to your doctor, pharmacist, or health care provider. °© 2018 [employer] (2016-03-06 16:29:33) ° °

## 2017-10-23 NOTE — Clinical Social Work Note (Addendum)
Patient still has not decided if she wants SNF or not. Hard copy of bed offers provided. She will discuss with her husband this afternoon. CSW encouraged her to make decision as soon as possible since her insurance company takes around 48 business hours to provide authorization.  Dayton Scrape, [place] [phone]  12:58 pm Patient has discharge orders to return home. RNCM spoke with her and patient now saying she cannot return home at discharge. Clinical liaison for [facility] will review for potential LOG. They only take them for [employer] patients on case-by-case basis.  Dayton Scrape, CSW [phone]  1:40 pm LOG denied. RNCM aware.  Dayton Scrape, CSW [phone]  2:36 pm CSW met with patient, husband, and caregiver at bedside to discuss how insurance pays for SNF and options available to her. Patient and husband agreeable to [facility]. Admissions coordinator notified and will start insurance authorization. Patient and husband understand that it typically takes 2 business days to get authorization so with it being Thursday afternoon, patient will likely be here over the weekend.  Dayton Scrape, [place]

## 2017-10-23 NOTE — Progress Notes (Signed)
Social worker working on placement for pt

## 2017-10-23 NOTE — Discharge Summary (Addendum)
Physician Discharge Summary  Kristen Garrison ZOX:[id number] DOB: 1926-09-01 DOA: 10/19/2017  PCP: Kristen Nephew, MD  Admit date: 10/19/2017 Discharge date: 10/24/2017  Recommendations for Outpatient Follow-up:  Would resume xarelto on 4/29 provided no evidence of bleeding. Message to Dr. Sherryl Manges send via Epic to discuss with pt if it si okay to continue on xarelto. On 4.29 it will be total of 7 days pt was off of xarelto.  Discharge Diagnoses:  Principal Problem:   Fall Active Problems:   Esophageal reflux   Atrial fibrillation (HCC)   HTN (hypertension)   UTI (urinary tract infection)   Heme positive stool   Orthostatic hypotension   Normocytic anemia   CKD (chronic kidney disease), stage III (HCC)   Acute blood loss anemia   Anxiety    Discharge Condition: stable   Diet recommendation: as tolerated   History of present illness:  84 y.o.femalewith medical history significant for hypertension, GERD, anxiety, atrial fibrillation on Xarelto, vertigo, syncope, dementia, CKD-3, GIB, anemia, who presented with fall, generalized weakness, dark stool and mild dysuria for past 2 weeks prior to this presentation. No loss of consciousness. No N/V/D. No abdominal pain. In ED, pt was hemodynamically stable. Hgb dropped from 11.9 to 8.0, WBC count was 10.6. UA was positive for nitrates. FOBT was positive. GI has seen the pt in consultation.  Hospital Course:   Assessment & Plan:   Principal Problem:   Fall - Likely due to symptomatic anemia - PT eval - pending, OT - SNF or 24 hour supervision  Active Problems:   Acute blood loss anemia / Acute upper GI bleed due to AVM - Pt presented with tarry stools and positive OIBT - Hgb as low as 7.4 - S/P 2 U PRBC transfusion during this hospital stay - Capsule endoscopy done 4/22, results showed possible small AVM - Hgb in 11 range - GI signed off    Paroxysmal atrial fibrillation (HCC) - CHA2DS2-VASc Score 4 - Holding AC due to  bleed; may resume 4/29 provided no further bleed  - Cardizem for HR control     HTN (hypertension) essential - Continue Cardizem and losartan     UTI (urinary tract infection) - Continue rocephin - Blood cx negative    CKD (chronic kidney disease), stage III (HCC) - Cr stable     DVT prophylaxis: SCD's Code Status: full code  Family Communication: no family at the bedside    Consultants:   GI  Procedures:   2 U PRBC transfusion   Capsule endoscopy  Antimicrobials:   None      Signed:  Manson Passey, MD  [facility] 10/23/2017, 12:39 PM  Pager #: [phone]  Time spent in minutes:45 minutes    Discharge Exam: Vitals:   10/23/17 0514 10/23/17 0853  BP: (!) 162/78 (!) 158/98  Pulse: 88 71  Resp: 18   Temp: 98 F (36.7 C)   SpO2: 97%    Vitals:   10/22/17 1120 10/22/17 1940 10/23/17 0514 10/23/17 0853  BP: (!) 163/74 (!) 142/66 (!) 162/78 (!) 158/98  Pulse: 85 91 88 71  Resp: 18 16 18    Temp:  98.1 F (36.7 C) 98 F (36.7 C)   TempSrc:  Oral Oral   SpO2:  90% 97%   Weight:   80.8 kg (178 lb 3.2 oz)   Height:        General: Pt is alert, follows commands appropriately, not in acute distress Cardiovascular: Regular rate and rhythm, S1/S2 +,  no murmurs Respiratory: Clear to auscultation bilaterally, no wheezing, no crackles, no rhonchi Abdominal: Soft, non tender, non distended, bowel sounds +, no guarding Extremities: no edema, no cyanosis, pulses palpable bilaterally DP and PT Neuro: Grossly nonfocal  Discharge Instructions  Discharge Instructions    Call MD for:  persistant nausea and vomiting   Complete by:  As directed    Call MD for:  redness, tenderness, or signs of infection (pain, swelling, redness, odor or green/yellow discharge around incision site)   Complete by:  As directed    Call MD for:  severe uncontrolled pain   Complete by:  As directed    Diet - low sodium heart healthy   Complete by:  As  directed    Discharge instructions   Complete by:  As directed    Would resume xarelto on 4/29 provided no evidence of bleeding. Message to Dr. Sherryl MangesSteven Garrison send via Epic to discuss with pt if it si okay to continue on xarelto. On 4.29 it will be total of 7 days pt was off of xarelto.   Increase activity slowly   Complete by:  As directed      Allergies as of 10/23/2017      Reactions   Clindamycin/lincomycin Diarrhea   Sulfonamide Derivatives Cough   Warfarin Sodium Other (See Comments)   Bleeding problems--Likely due to uncorrected Warfarin requirements   Cefzil [cefprozil] Other (See Comments)   Unknown reaction..   Tramadol Other (See Comments)   Unknown reaction   Carvedilol Other (See Comments)   dizzy   Celecoxib Rash   Doxycycline Nausea Only   Gabapentin Rash   Guanfacine Hcl Diarrhea   Lisinopril Cough   Lyrica [pregabalin] Rash      Medication List    STOP taking these medications   amoxicillin 500 MG tablet Commonly known as:  AMOXIL   HYDROcodone-acetaminophen 5-325 MG tablet Commonly known as:  NORCO/VICODIN     TAKE these medications   acetaminophen 650 MG CR tablet Commonly known as:  TYLENOL Take 1,300 mg by mouth at bedtime.   clorazepate 3.75 MG tablet Commonly known as:  TRANXENE Take 3.75 mg by mouth 3 (three) times daily.   diltiazem 30 MG tablet Commonly known as:  CARDIZEM Take 1 tablet (30 mg total) by mouth every 8 (eight) hours.   furosemide 40 MG tablet Commonly known as:  LASIX Take 60 mg by mouth daily.   latanoprost 0.005 % ophthalmic solution Commonly known as:  XALATAN Place 1 drop into both eyes at bedtime.   losartan 100 MG tablet Commonly known as:  COZAAR Take 100 mg by mouth daily.   multivitamin with minerals Tabs tablet Take 1 tablet by mouth daily.   OVER THE COUNTER MEDICATION Place 1 drop into both eyes daily as needed (dry eyes/irritation). Over the counter lubricating eye drop   pantoprazole 40 MG  tablet Commonly known as:  PROTONIX Take 40 mg by mouth 2 (two) times daily.   potassium chloride SA 20 MEQ tablet Commonly known as:  K-DUR,KLOR-CON Take 20 mEq by mouth See admin instructions. Take one tablet (20 meq) by mouth on Monday thru Friday morning (skip Saturday and Sunday)   predniSONE 5 MG tablet Commonly known as:  DELTASONE Take 15 mg by mouth 2 (two) times daily.   Rivaroxaban 15 MG Tabs tablet Commonly known as:  XARELTO Take 1 tablet (15 mg total) by mouth at bedtime. Start taking on:  10/27/2017 What changed:    how  much to take  when to take this  These instructions start on 10/27/2017. If you are unsure what to do until then, ask your doctor or other care provider.   traZODone 50 MG tablet Commonly known as:  DESYREL Take 1 tablet (50 mg total) by mouth at bedtime.   venlafaxine XR 150 MG 24 hr capsule Commonly known as:  EFFEXOR-XR Take 150 mg by mouth daily with breakfast.      Follow-up Information    Kristen Nephew, MD. Schedule an appointment as soon as possible for a visit in 1 week(s).   Specialty:  Internal Medicine Contact information: [address] [place] [address] [phone]            The results of significant diagnostics from this hospitalization (including imaging, microbiology, ancillary and laboratory) are listed below for reference.    Significant Diagnostic Studies: Ct Head Wo Contrast  Result Date: 10/19/2017 CLINICAL DATA:  Fall, hit head EXAM: CT HEAD WITHOUT CONTRAST TECHNIQUE: Contiguous axial images were obtained from the base of the skull through the vertex without intravenous contrast. COMPARISON:  06/02/2016 FINDINGS: Brain: There is atrophy and chronic small vessel disease changes. Specifically, no hemorrhage, mass lesion, acute infarction, or significant intracranial injury. Ventriculomegaly seems out of proportion for degree of atrophy. A component of normal pressure hydrocephalus cannot be  excluded. No change. 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 Vascular: No hyperdense vessel or unexpected calcification. Skull: No acute calvarial abnormality. Sinuses/Orbits: Visualized paranasal sinuses and mastoids clear. Orbital soft tissues unremarkable. Other: None IMPRESSION: Atrophy, chronic microvascular disease. Ventriculomegaly may be related to ex vacuo dilatation although normal pressure hydrocephalus cannot be excluded. Findings are stable. Electronically Signed   By: Charlett Nose M.D.   On: 10/19/2017 23:03    Microbiology: Recent Results (from the past 240 hour(s))  Culture, blood (Routine X 2) w Reflex to ID Panel     Status: None (Preliminary result)   Collection Time: 10/20/17  1:30 AM  Result Value Ref Range Status   Specimen Description BLOOD RIGHT HAND  Final   Special Requests   Final    BOTTLES DRAWN AEROBIC AND ANAEROBIC Blood Culture adequate volume   Culture   Final    NO GROWTH 2 DAYS Performed at [facility], [address] [place], [place] [address]    Report Status PENDING  Incomplete  Culture, blood (Routine X 2) w Reflex to ID Panel     Status: None (Preliminary result)   Collection Time: 10/20/17  1:49 AM  Result Value Ref Range Status   Specimen Description BLOOD LEFT HAND  Final   Special Requests   Final    BOTTLES DRAWN AEROBIC ONLY Blood Culture adequate volume   Culture   Final    NO GROWTH 2 DAYS Performed at [facility], [address], [place], [place] [address]    Report Status PENDING  Incomplete     Labs: Basic Metabolic Panel: Recent Labs  Lab 10/19/17 1925 10/20/17 0730 10/21/17 0250  NA 139 136 140  K 3.9 3.7 3.7  CL 101 102 107  CO2 25 23 22   GLUCOSE 135* 93 157*  BUN 30* 20 21*  CREATININE 1.24* 1.01* 1.12*  CALCIUM 8.8* 8.1* 8.4*   Liver Function Tests: No results for input(s): AST, ALT, ALKPHOS, BILITOT, PROT, ALBUMIN in the last 168 hours. No results for input(s): LIPASE, AMYLASE in  the last 168 hours. No results for input(s):  AMMONIA in the last 168 hours. CBC: Recent Labs  Lab 10/19/17 1855  10/20/17 0730 10/20/17 1804 10/20/17 1930 10/21/17 0250 10/22/17 0427  WBC 10.6*   < > 10.3 10.6* 10.1 9.3 9.8  NEUTROABS 7.5  --   --   --   --   --   --   HGB 8.0*   < > 9.4* 11.2* 10.7* 10.7* 11.4*  HCT 27.0*   < > 29.8* 36.0 34.2* 33.7* 36.7  MCV 88.2   < > 87.4 88.0 87.9 87.8 88.4  PLT 329   < > 298 306 289 292 302   < > = values in this interval not displayed.   Cardiac Enzymes: No results for input(s): CKTOTAL, CKMB, CKMBINDEX, TROPONINI in the last 168 hours. BNP: BNP (last 3 results) Recent Labs    10/20/17 0129  BNP 93.5    ProBNP (last 3 results) No results for input(s): PROBNP in the last 8760 hours.  CBG: Recent Labs  Lab 10/20/17 0800 10/21/17 0712 10/22/17 0740 10/23/17 0702  GLUCAP 108* 111* 126* 123*

## 2017-10-23 NOTE — Progress Notes (Signed)
Occupational Therapy Treatment Patient Details Name: Kristen Garrison MRN: [id number] DOB: Jun 04, 1927 Today's Date: 10/23/2017    History of present illness Pt is a 84 y.o. female with PMH significant of HTN, anxiety, atrial fibrillation on Xarelto, vertigo, syncope, dementia, CKD-3, GIB, anemia, who presents 10/19/17 with fall, generalized weakness, dark stool and mild dysuria. Multifactorial etiology, including UTI, symptomatic anemia, dehydration, positive orthostatic status and peripheral neuropathy.   OT comments  Pt making progress with functional goals. Pt pleasant but continues to have some confusion. OT will continue to follow acutely  Follow Up Recommendations  SNF;Supervision/Assistance - 24 hour    Equipment Recommendations  None recommended by OT    Recommendations for Other Services      Precautions / Restrictions Precautions Precautions: Fall Restrictions Weight Bearing Restrictions: No       Mobility Bed Mobility Overal bed mobility: Needs Assistance Bed Mobility: Supine to Sit     Supine to sit: Min assist     General bed mobility comments: pt up in recliner upon OT arrival  Transfers Overall transfer level: Needs assistance Equipment used: Rolling walker (2 wheeled) Transfers: Sit to/from Stand Sit to Stand: Max assist         General transfer comment: sit - stand x 3 from recliner to RW, SPT to [facility] and back to recliner    Balance Overall balance assessment: Needs assistance Sitting-balance support: No upper extremity supported;Feet supported;Bilateral upper extremity supported Sitting balance-Leahy Scale: Poor Sitting balance - Comments: Heavy reliance on BUE support to maintain balance due to poor trunk control; intermittent min-modA to maintain upright posture Postural control: Right lateral lean;Posterior lean Standing balance support: Bilateral upper extremity supported;During functional activity Standing balance-Leahy Scale: Poor Standing  balance comment: Reluctant to stand fully upright due to fear of BLE pain and buckling; heavy reliance on BUE support and external assist                           ADL either performed or assessed with clinical judgement   ADL Overall ADL's : Needs assistance/impaired     Grooming: Minimal assistance;Standing                   Toilet Transfer: RW;Maximal assistance;Stand-pivot;BSC   Toileting- Clothing Manipulation and Hygiene: Total assistance;Sit to/from stand Toileting - Clothing Manipulation Details (indicate cue type and reason): pt unaware that she began to have a BM until standing at RW     Functional mobility during ADLs: Maximal assistance;Rolling walker       Vision Patient Visual Report: No change from baseline     Perception     Praxis      Cognition Arousal/Alertness: Awake/alert Behavior During Therapy: WFL for tasks assessed/performed Overall Cognitive Status: History of cognitive impairments - at baseline Area of Impairment: Orientation;Attention;Memory;Following commands;Safety/judgement;Awareness;Problem solving                 Orientation Level: Disoriented to;Place;Time;Situation Current Attention Level: Selective Memory: Decreased short-term memory Following Commands: Follows one step commands consistently;Follows multi-step commands with increased time Safety/Judgement: Decreased awareness of safety;Decreased awareness of deficits Awareness: Emergent Problem Solving: Slow processing;Requires verbal cues General Comments: Pt knows she is in hospital, but asking when she can get dressed, needs help to pee (has purewick), despite reorientation to and education on current situation.        Exercises     Shoulder Instructions       General Comments  pt pleasant  and cooperative    Pertinent Vitals/ Pain       Pain Assessment: No/denies pain Pain Score: 0-No pain Faces Pain Scale: No hurt Pain Location: R thigh area, L  knee Pain Descriptors / Indicators: Aching;Grimacing;Discomfort;Constant Pain Intervention(s): Monitored during session  Home Living                                          Prior Functioning/Environment              Frequency  Min 2X/week        Progress Toward Goals  OT Goals(current goals can now be found in the care plan section)  Progress towards OT goals: Progressing toward goals  Acute Rehab OT Goals Patient Stated Goal: Be able to walk again  Plan Discharge plan remains appropriate    Co-evaluation                 AM-PAC PT "6 Clicks" Daily Activity     Outcome Measure   Help from another person eating meals?: None Help from another person taking care of personal grooming?: A Little Help from another person toileting, which includes using toliet, bedpan, or urinal?: A Lot Help from another person bathing (including washing, rinsing, drying)?: A Lot Help from another person to put on and taking off regular upper body clothing?: A Little Help from another person to put on and taking off regular lower body clothing?: A Lot 6 Click Score: 16    End of Session Equipment Utilized During Treatment: Gait belt;Rolling walker;Other (comment)(BSC)  OT Visit Diagnosis: Unsteadiness on feet (R26.81);Other abnormalities of gait and mobility (R26.89);Muscle weakness (generalized) (M62.81);Pain   Activity Tolerance Patient limited by fatigue   Patient Left in chair;with call bell/phone within reach;with chair alarm set   Nurse Communication      Functional Assessment Tool Used: AM-PAC 6 Clicks Daily Activity   Time: 1610-96041054-1133 OT Time Calculation (min): 39 min  Charges: OT G-codes **NOT FOR INPATIENT CLASS** Functional Assessment Tool Used: AM-PAC 6 Clicks Daily Activity OT General Charges $OT Visit: 1 Visit OT Treatments $Self Care/Home Management : 8-22 mins $Therapeutic Activity: 23-37 mins     Galen ManilaSpencer, Jarick Harkins  Jeanette 10/23/2017, 2:46 PM

## 2017-10-23 NOTE — Progress Notes (Signed)
Social worker at bedside to talk with family member

## 2017-10-23 NOTE — Progress Notes (Signed)
Physical Therapy Treatment Patient Details Name: Kristen Garrison MRN: [id number] DOB: 06/19/1927 Today's Date: 10/23/2017    History of Present Illness Pt is a 84 y.o. female with PMH significant of HTN, anxiety, atrial fibrillation on Xarelto, vertigo, syncope, dementia, CKD-3, GIB, anemia, who presents 10/19/17 with fall, generalized weakness, dark stool and mild dysuria. Multifactorial etiology, including UTI, symptomatic anemia, dehydration, positive orthostatic status and peripheral neuropathy.   PT Comments    Pt slowly progressing with mobility. Remains limited by weakness, fatigue, chronic BLE pain, and fear of falling. Able to stand and take short, shuffling steps to recliner with mod-maxA to prevent LOB. Pt reluctant to stand fully upright due to fear of BLE pain and buckling, requiring max encouragement and multimodal cues throughout session. Intermittently confused regarding current situation. Increased time spent educating pt regarding PT recommendation for SNF-level therapies at d/c as she is not safe to return home; pt in agreement with this now. Will follow acutely.   Follow Up Recommendations  SNF;Supervision/Assistance - 24 hour     Equipment Recommendations  Rolling walker with 5" wheels;3in1 (PT)    Recommendations for Other Services       Precautions / Restrictions Precautions Precautions: Fall Restrictions Weight Bearing Restrictions: No    Mobility  Bed Mobility Overal bed mobility: Needs Assistance Bed Mobility: Supine to Sit     Supine to sit: Min assist     General bed mobility comments: MinA for UE support to assist trunk elevation. Pt with poor trunk control, heavy reliance on BUEs to lean, falling to R-side and posteriorly multiple times  Transfers Overall transfer level: Needs assistance Equipment used: Rolling walker (2 wheeled) Transfers: Sit to/from Stand Sit to Stand: Max assist;From elevated surface         General transfer comment:  Stood from elevated bed height with RW and maxA. Required max encouragement to stand fully upright with BUE support on RW, as pt attempting squat pivot to chair. Unwilling to attempt standing again from recliner despite maxA secondary to c/o pain and fatigue  Ambulation/Gait Ambulation/Gait assistance: Mod assist Ambulation Distance (Feet): 1 Feet Assistive device: Rolling walker (2 wheeled) Gait Pattern/deviations: Shuffle;Trunk flexed;Leaning posteriorly     General Gait Details: Took shuffling steps from bed to recliner with RW and modA to prevent posterior LOB; pt c/o bilat knee pain preventing further mobility attempt.   Stairs             Wheelchair Mobility    Modified Rankin (Stroke Patients Only)       Balance Overall balance assessment: Needs assistance Sitting-balance support: No upper extremity supported;Feet supported;Bilateral upper extremity supported Sitting balance-Leahy Scale: Poor Sitting balance - Comments: Heavy reliance on BUE support to maintain balance due to poor trunk control; intermittent min-modA to maintain upright posture Postural control: Right lateral lean;Posterior lean Standing balance support: Bilateral upper extremity supported Standing balance-Leahy Scale: Poor Standing balance comment: Reluctant to stand fully upright due to fear of BLE pain and buckling; heavy reliance on BUE support and external assist                            Cognition Arousal/Alertness: Awake/alert Behavior During Therapy: WFL for tasks assessed/performed Overall Cognitive Status: History of cognitive impairments - at baseline Area of Impairment: Orientation;Attention;Memory;Following commands;Safety/judgement;Awareness;Problem solving                 Orientation Level: Disoriented to;Place;Time;Situation Current Attention Level: Selective Memory: Decreased short-term  memory Following Commands: Follows one step commands consistently;Follows  multi-step commands with increased time Safety/Judgement: Decreased awareness of safety;Decreased awareness of deficits Awareness: Emergent Problem Solving: Slow processing;Requires verbal cues General Comments: Pt knows she is in hospital, but asking when she can get dressed, needs help to pee (has purewick), despite reorientation to and education on current situation.      Exercises      General Comments        Pertinent Vitals/Pain Pain Assessment: Faces Faces Pain Scale: Hurts little more Pain Location: R thigh area, L knee Pain Descriptors / Indicators: Aching;Grimacing;Discomfort;Constant Pain Intervention(s): Monitored during session;Limited activity within patient's tolerance    Home Living                      Prior Function            PT Goals (current goals can now be found in the care plan section) Acute Rehab PT Goals Patient Stated Goal: Be able to walk again PT Goal Formulation: With patient Time For Goal Achievement: 11/03/17 Potential to Achieve Goals: Good Progress towards PT goals: Progressing toward goals    Frequency    Min 2X/week      PT Plan Current plan remains appropriate    Co-evaluation              AM-PAC PT "6 Clicks" Daily Activity  Outcome Measure  Difficulty turning over in bed (including adjusting bedclothes, sheets and blankets)?: Unable Difficulty moving from lying on back to sitting on the side of the bed? : Unable Difficulty sitting down on and standing up from a chair with arms (e.g., wheelchair, bedside commode, etc,.)?: Unable Help needed moving to and from a bed to chair (including a wheelchair)?: A Lot Help needed walking in hospital room?: A Lot Help needed climbing 3-5 steps with a railing? : Total 6 Click Score: 8    End of Session Equipment Utilized During Treatment: Gait belt Activity Tolerance: Patient limited by fatigue;Patient limited by pain Patient left: in chair;with call bell/phone  within reach;with chair alarm set Nurse Communication: Mobility status PT Visit Diagnosis: Unsteadiness on feet (R26.81);Other abnormalities of gait and mobility (R26.89);Muscle weakness (generalized) (M62.81);History of falling (Z91.81);Pain Pain - part of body: Knee     Time: [id number] PT Time Calculation (min) (ACUTE ONLY): 24 min  Charges:  $Therapeutic Activity: 23-37 mins                    G Codes:      Ina HomesJaclyn Sidi Dzikowski, PT, DPT Acute Rehab Services  Pager: [phone]  Malachy ChamberJaclyn L Dominika Losey 10/23/2017, 11:22 AM

## 2017-10-24 LAB — GLUCOSE, CAPILLARY: GLUCOSE-CAPILLARY: 131 mg/dL — AB (ref 65–99)

## 2017-10-24 NOTE — Progress Notes (Signed)
Pt seen and examined at bedside. Stable for discharge once bed available at SNF. No changes in medical management since 4/25. Pt medically stable for discharge today. Please refer to dc summary done 4.25/2019.  Kristen Passeylma [facility] [facility] [phone]

## 2017-10-24 NOTE — Care Management Important Message (Signed)
Important Message  Patient Details  Name: Kristen Garrison MRN: [id number] Date of Birth: 09-Nov-1926   Medicare Important Message Given:  Yes    [name] 10/24/2017, 12:54 PM

## 2017-10-24 NOTE — Clinical Social Work Placement (Signed)
   CLINICAL SOCIAL WORK PLACEMENT  NOTE  Date:  10/24/2017  Patient Details  Name: Kristen Garrison MRN: [id number] Date of Birth: 05/27/1927  Clinical Social Work is seeking post-discharge placement for this patient at the Skilled  Nursing Facility level of care (*CSW will initial, date and re-position this form in  chart as items are completed):  Yes   Patient/family provided with [facility] Clinical Social Work Department's list of facilities offering this level of care within the geographic area requested by the patient (or if unable, by the patient's family).  Yes   Patient/family informed of their freedom to choose among providers that offer the needed level of care, that participate in Medicare, Medicaid or managed care program needed by the patient, have an available bed and are willing to accept the patient.  Yes   Patient/family informed of 's ownership interest in [facility] and [facility], as well as of the fact that they are under no obligation to receive care at these facilities.  PASRR submitted to EDS on 10/21/17     PASRR number received on       Existing PASRR number confirmed on 10/21/17     FL2 transmitted to all facilities in geographic area requested by pt/family on 10/21/17     FL2 transmitted to all facilities within larger geographic area on       Patient informed that his/her managed care company has contracts with or will negotiate with certain facilities, including the following:        Yes   Patient/family informed of bed offers received.  [facility]     Physician recommends and patient chooses bed at      [facility].  Patient to be transferred to facility by [facility]AR     Patient family notified on 10/24/17 of transfer.  Name of family member notified:  Patient will notify her husband when he arrives.     PHYSICIAN Please prepare prescriptions     Additional  Comment:    _______________________________________________ Margarito LinerSarah C Tammela Bales, LCSW 10/24/2017, 11:44 AM

## 2017-10-24 NOTE — Progress Notes (Signed)
[facility] as pt will be going there this PM for rehab. All personal belongings with pt. IV removed, AVS with pt chart.  Pt demonstrates no s/sx of distress or discomfort at this time.

## 2017-10-24 NOTE — Progress Notes (Addendum)
[facility] at bedside at time of pt's fall. Pt was assisted from chair to bed by PTAR at time of fall. Skin tear to right leg. Dressing placed. Vitals WNL. MD text paged regarding fall. Post fall assessment completed. MD states pt may still discharge to SNF.

## 2017-10-24 NOTE — Clinical Social Work Note (Signed)
Patient has insurance approval to discharge [facility]. They have a bed for her today.  Charlynn CourtSarah Mathieu Schloemer, CSW [phone]

## 2017-10-24 NOTE — Clinical Social Work Note (Signed)
CSW facilitated patient discharge including contacting facility to confirm patient discharge plans. Patient will notify her husband when he arrives to the hospital. Clinical information faxed to facility and family agreeable with plan. CSW arranged ambulance transport via [facility] at 3:00 pm. RN to call report prior to discharge [phone] Room [id number]).  CSW will sign off for now as social work intervention is no longer needed. Please consult us again if new needs arise.  Charlynn CourtSarah Mariadel Mruk, CSW [phone]

## 2017-10-24 NOTE — Progress Notes (Signed)
Patient up in recliner chair waiting for [facility] to transport to [facility]. Chair Alarm sounded. Upon entering the room observed patient on floor sitting upright with PT[facility] transporters beside pt. Transporters unable to move patient from current position to chair or bed. This scriber assissted in getting patient up on bed. Interruption in skin noted to right lower leg approx 4 inches in length with skin tear zig zagged . Cleansed with NS, xeroform gauze applied and wrapped with kerlex. Charge nurse notified. Physician notified. Scriber will notify facility and family. Pt did not hit her head. V/S obtained and documented.  Report was given to PTAR prior to 2nd transport assistant entering the room that pt was alert but not oriented.

## 2017-10-24 NOTE — Progress Notes (Signed)
Attempted to call facility to give them update on pt incident but no answer.

## 2017-10-25 LAB — CULTURE, BLOOD (ROUTINE X 2)
CULTURE: NO GROWTH
CULTURE: NO GROWTH
SPECIAL REQUESTS: ADEQUATE
Special Requests: ADEQUATE

## 2018-01-26 ENCOUNTER — Encounter (HOSPITAL_COMMUNITY): Payer: Self-pay | Admitting: Emergency Medicine

## 2018-01-26 ENCOUNTER — Emergency Department (HOSPITAL_COMMUNITY)
Admission: EM | Admit: 2018-01-26 | Discharge: 2018-01-26 | Disposition: A | Discharge status: Home / Self Care | Payer: Medicare HMO | Attending: Emergency Medicine | Admitting: Emergency Medicine

## 2018-01-26 ENCOUNTER — Emergency Department (HOSPITAL_COMMUNITY): Payer: Medicare HMO

## 2018-01-26 ENCOUNTER — Other Ambulatory Visit: Payer: Self-pay

## 2018-01-26 DIAGNOSIS — Z96651 Presence of right artificial knee joint: Secondary | ICD-10-CM | POA: Diagnosis not present

## 2018-01-26 DIAGNOSIS — R0781 Pleurodynia: Secondary | ICD-10-CM | POA: Insufficient documentation

## 2018-01-26 DIAGNOSIS — W19XXXA Unspecified fall, initial encounter: Secondary | ICD-10-CM | POA: Diagnosis not present

## 2018-01-26 DIAGNOSIS — I129 Hypertensive chronic kidney disease with stage 1 through stage 4 chronic kidney disease, or unspecified chronic kidney disease: Secondary | ICD-10-CM | POA: Diagnosis not present

## 2018-01-26 DIAGNOSIS — Z79899 Other long term (current) drug therapy: Secondary | ICD-10-CM | POA: Diagnosis not present

## 2018-01-26 DIAGNOSIS — Z9181 History of falling: Secondary | ICD-10-CM | POA: Insufficient documentation

## 2018-01-26 DIAGNOSIS — Y999 Unspecified external cause status: Secondary | ICD-10-CM | POA: Insufficient documentation

## 2018-01-26 DIAGNOSIS — N183 Chronic kidney disease, stage 3 (moderate): Secondary | ICD-10-CM | POA: Diagnosis not present

## 2018-01-26 DIAGNOSIS — F0390 Unspecified dementia without behavioral disturbance: Secondary | ICD-10-CM | POA: Insufficient documentation

## 2018-01-26 DIAGNOSIS — Y92129 Unspecified place in nursing home as the place of occurrence of the external cause: Secondary | ICD-10-CM | POA: Insufficient documentation

## 2018-01-26 DIAGNOSIS — Z7901 Long term (current) use of anticoagulants: Secondary | ICD-10-CM | POA: Diagnosis not present

## 2018-01-26 DIAGNOSIS — R531 Weakness: Secondary | ICD-10-CM | POA: Insufficient documentation

## 2018-01-26 DIAGNOSIS — Z95818 Presence of other cardiac implants and grafts: Secondary | ICD-10-CM | POA: Diagnosis not present

## 2018-01-26 DIAGNOSIS — Y939 Activity, unspecified: Secondary | ICD-10-CM | POA: Diagnosis not present

## 2018-01-26 LAB — CBC WITH DIFFERENTIAL/PLATELET
Abs Immature Granulocytes: 0 10*3/uL (ref 0.0–0.1)
BASOS ABS: 0.1 10*3/uL (ref 0.0–0.1)
BASOS PCT: 1 %
EOS ABS: 0.1 10*3/uL (ref 0.0–0.7)
EOS PCT: 1 %
HCT: 38.8 % (ref 36.0–46.0)
Hemoglobin: 12 g/dL (ref 12.0–15.0)
IMMATURE GRANULOCYTES: 0 %
Lymphocytes Relative: 33 %
Lymphs Abs: 2.9 10*3/uL (ref 0.7–4.0)
MCH: 31.5 pg (ref 26.0–34.0)
MCHC: 30.9 g/dL (ref 30.0–36.0)
MCV: 101.8 fL — ABNORMAL HIGH (ref 78.0–100.0)
Monocytes Absolute: 0.5 10*3/uL (ref 0.1–1.0)
Monocytes Relative: 5 %
NEUTROS PCT: 60 %
Neutro Abs: 5.3 10*3/uL (ref 1.7–7.7)
PLATELETS: 317 10*3/uL (ref 150–400)
RBC: 3.81 MIL/uL — AB (ref 3.87–5.11)
RDW: 13.6 % (ref 11.5–15.5)
WBC: 8.8 10*3/uL (ref 4.0–10.5)

## 2018-01-26 LAB — COMPREHENSIVE METABOLIC PANEL
ALBUMIN: 3 g/dL — AB (ref 3.5–5.0)
ALT: 11 U/L (ref 0–44)
ANION GAP: 11 (ref 5–15)
AST: 16 U/L (ref 15–41)
Alkaline Phosphatase: 93 U/L (ref 38–126)
BUN: 17 mg/dL (ref 8–23)
CHLORIDE: 104 mmol/L (ref 98–111)
CO2: 24 mmol/L (ref 22–32)
Calcium: 9.2 mg/dL (ref 8.9–10.3)
Creatinine, Ser: 1.01 mg/dL — ABNORMAL HIGH (ref 0.44–1.00)
GFR calc Af Amer: 55 mL/min — ABNORMAL LOW (ref >60)
GFR calc non Af Amer: 48 mL/min — ABNORMAL LOW (ref >60)
GLUCOSE: 94 mg/dL (ref 70–99)
POTASSIUM: 4.5 mmol/L (ref 3.5–5.1)
SODIUM: 139 mmol/L (ref 135–145)
Total Bilirubin: 0.7 mg/dL (ref 0.3–1.2)
Total Protein: 6.2 g/dL — ABNORMAL LOW (ref 6.5–8.1)

## 2018-01-26 LAB — URINALYSIS, ROUTINE W REFLEX MICROSCOPIC
Bilirubin Urine: NEGATIVE
GLUCOSE, UA: NEGATIVE mg/dL
Hgb urine dipstick: NEGATIVE
Ketones, ur: NEGATIVE mg/dL
LEUKOCYTES UA: NEGATIVE
Nitrite: NEGATIVE
Protein, ur: NEGATIVE mg/dL
SPECIFIC GRAVITY, URINE: 1.012 (ref 1.005–1.030)
pH: 5 (ref 5.0–8.0)

## 2018-01-26 LAB — TROPONIN I

## 2018-01-26 LAB — BRAIN NATRIURETIC PEPTIDE: B Natriuretic Peptide: 66.7 pg/mL (ref 0.0–100.0)

## 2018-01-26 MED ORDER — ACETAMINOPHEN 500 MG PO TABS
1000.0000 mg | ORAL_TABLET | Freq: Once | ORAL | Status: AC
Start: 2018-01-26 — End: 2018-01-26
  Administered 2018-01-26: 1000 mg via ORAL
  Filled 2018-01-26: qty 2

## 2018-01-26 MED ORDER — ACETAMINOPHEN 500 MG PO TABS: 500.0000 mg | ORAL_TABLET | ORAL | 0 refills | Status: DC | PRN

## 2018-01-26 NOTE — ED Notes (Signed)
Patient found trying to crawl out of bed. Patient had removed all leads, cabling and IV. Patient placed back into top of bed, new leads and cabling applied. Bed tilted back, low to ground.
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
## 2018-01-26 NOTE — ED Notes (Addendum)
Patient transported to CT, then X-Ray 

## 2018-01-26 NOTE — Discharge Instructions (Addendum)
Ms. Kristen Garrison's imaging showed a possible rib injury on the right.  We have prescribed Tylenol for this.  Encouraged her to use her incentive spirometer to prevent pneumonia.  Lab work was otherwise reassuring and unremarkable.

## 2018-01-26 NOTE — ED Notes (Signed)
Attempted report at [facility]. No one answered the phone. After 10 plus rings message came on stating no one could answer the phone at this time.

## 2018-01-26 NOTE — ED Triage Notes (Signed)
[facility] after having a fall on Saturday and son requested to have her sent here to be evaluated. Pt is non-ambulatory and ems reports multiple falls previously. Pt is alert and orientated to person and place but confused about time, per [facility] pt waxes and wanes in mentation level.

## 2018-10-30 IMAGING — DX DG LUMBAR SPINE 2-3V
3 series · 3 of 3 positions shown · non-contrast
Comparison: 09/07/2017

CLINICAL DATA: Patient slipped out of bed a few days ago. Denies
any lower back pain. Denies any chest pain, or sob. Hx of atrial
fibrillation, first degree heart block, htn, implantable loop
recorder, lumbosacral radiculopathy at S1

EXAM:
LUMBAR SPINE - 2-3 VIEW

[t lumbar spine ap]
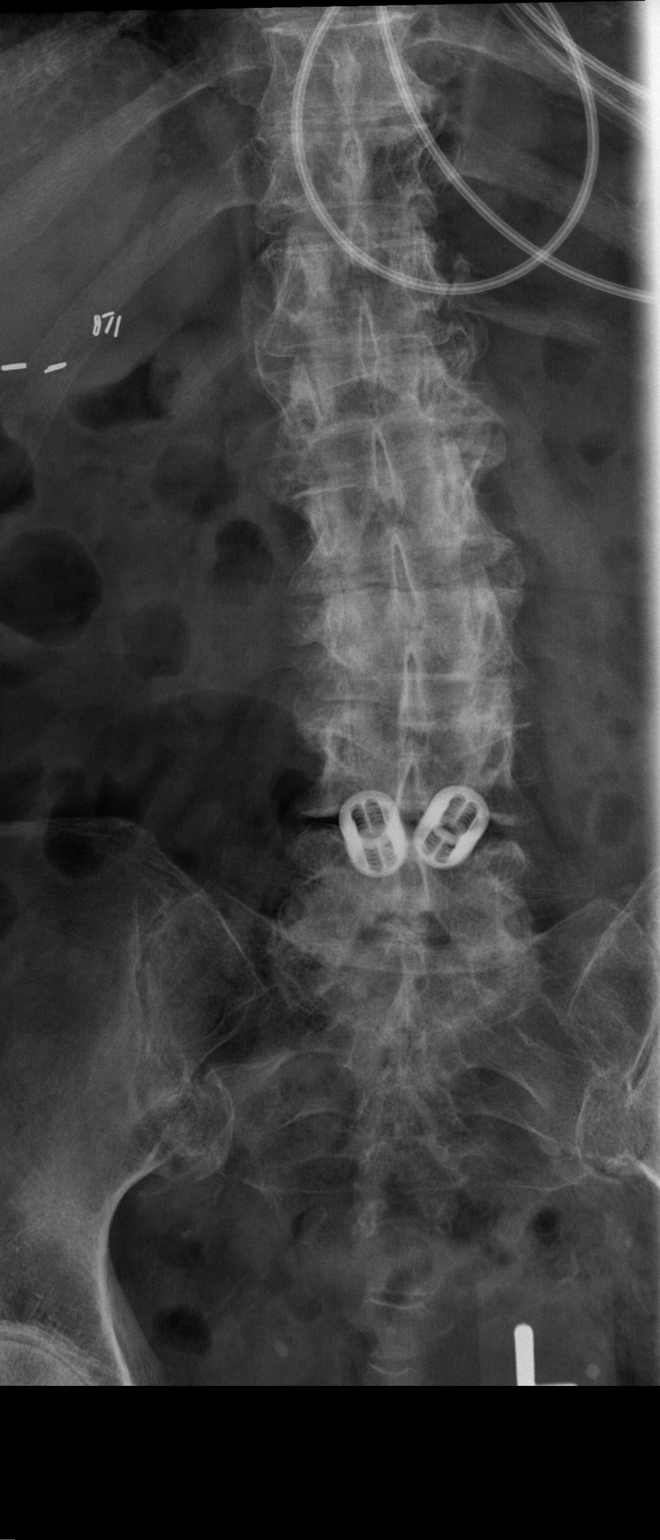

[t lumbar spine lat]
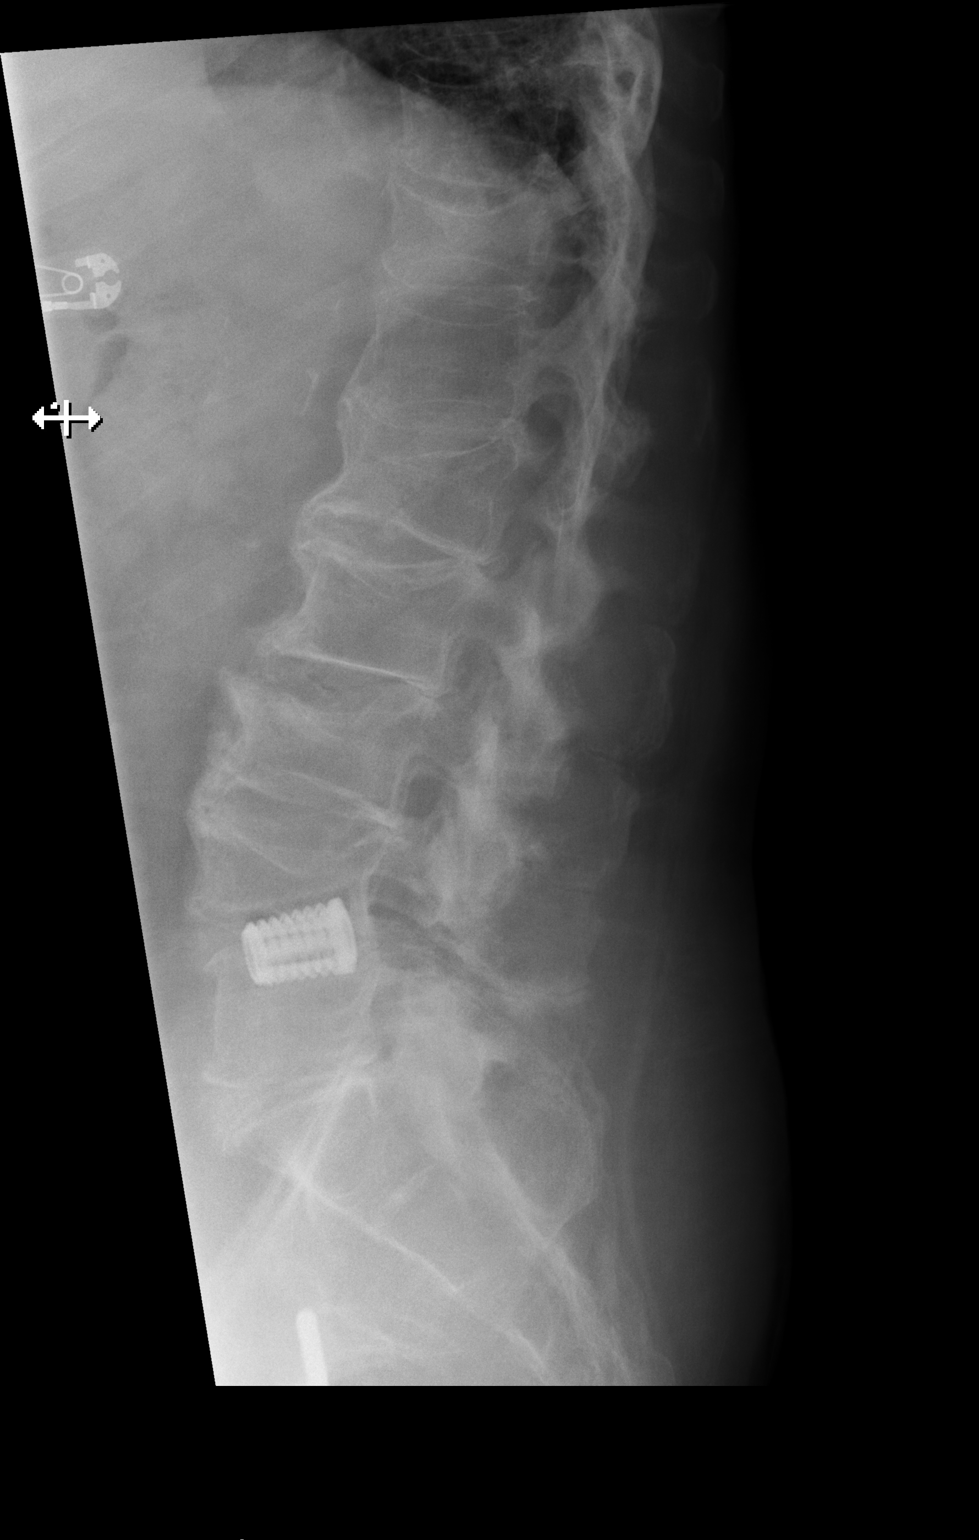

[t lumbar l-5 s-1 spot]
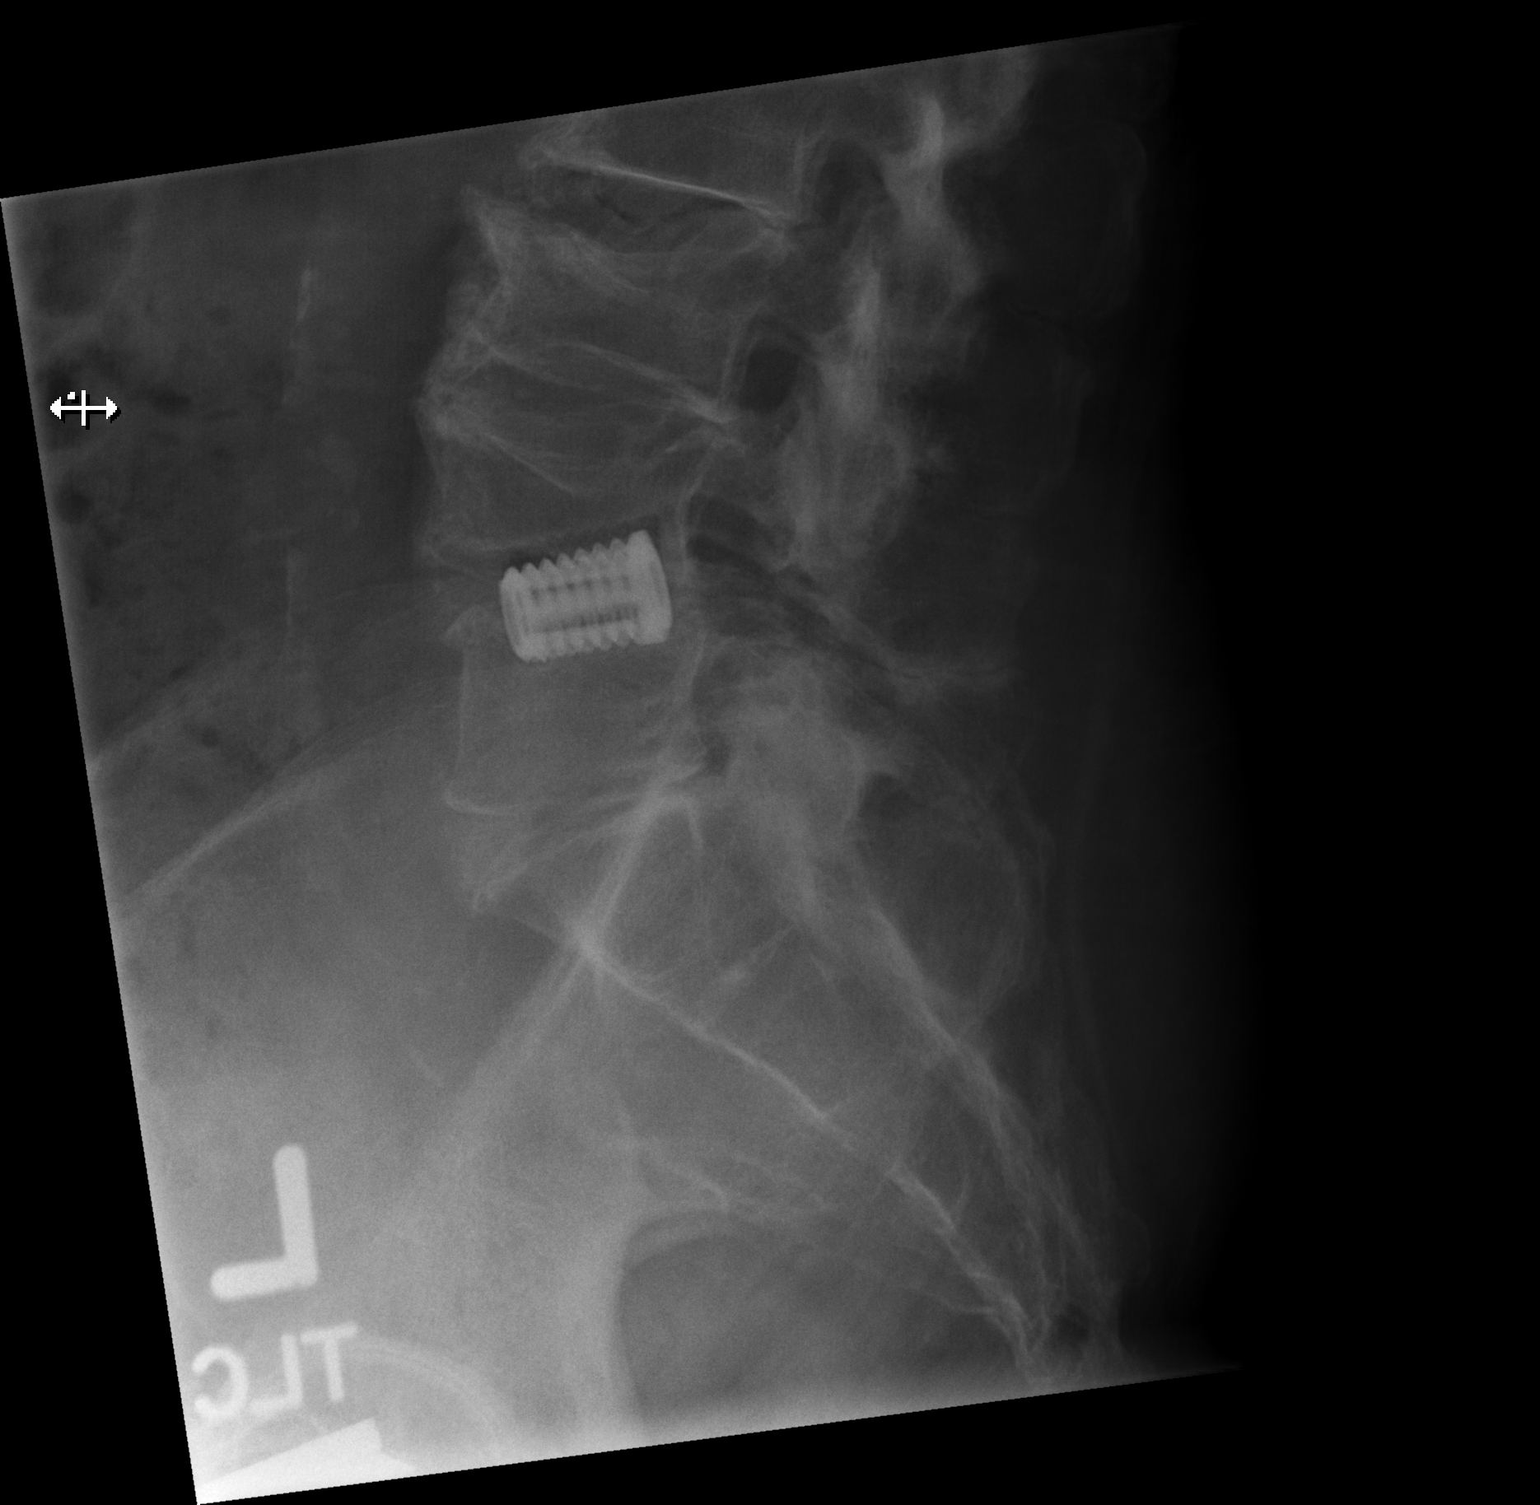

[3 of 3 positions shown; findings below may reference images not displayed]

FINDINGS: Remote RIGHT changes at L4-5. Stable wedge fractures of T11, L1, L3,
and L4. There is a superior endplate fracture of L2, also stable.
There is no acute fracture or subluxation. Significant facet
hypertrophy noted throughout the lumbar spine. Surgical clips are
noted in the RIGHT UPPER QUADRANT the abdomen.
IMPRESSION: Significant degenerative changes and chronic wedge compression
fractures. No evidence for acute abnormality.

## 2018-10-30 IMAGING — DX DG CHEST 2V
2 series · 2 of 2 positions shown · non-contrast
Comparison: 09/07/2017

CLINICAL DATA: Patient slipped out of bed a few days ago. Denies
any lower back pain. Denies any chest pain, or sob. Hx of atrial
fibrillation, first degree heart block, htn, implantable loop
recorder, lumbosacral radiculopathy at S1

EXAM:
CHEST - 2 VIEW

[t chest supine]
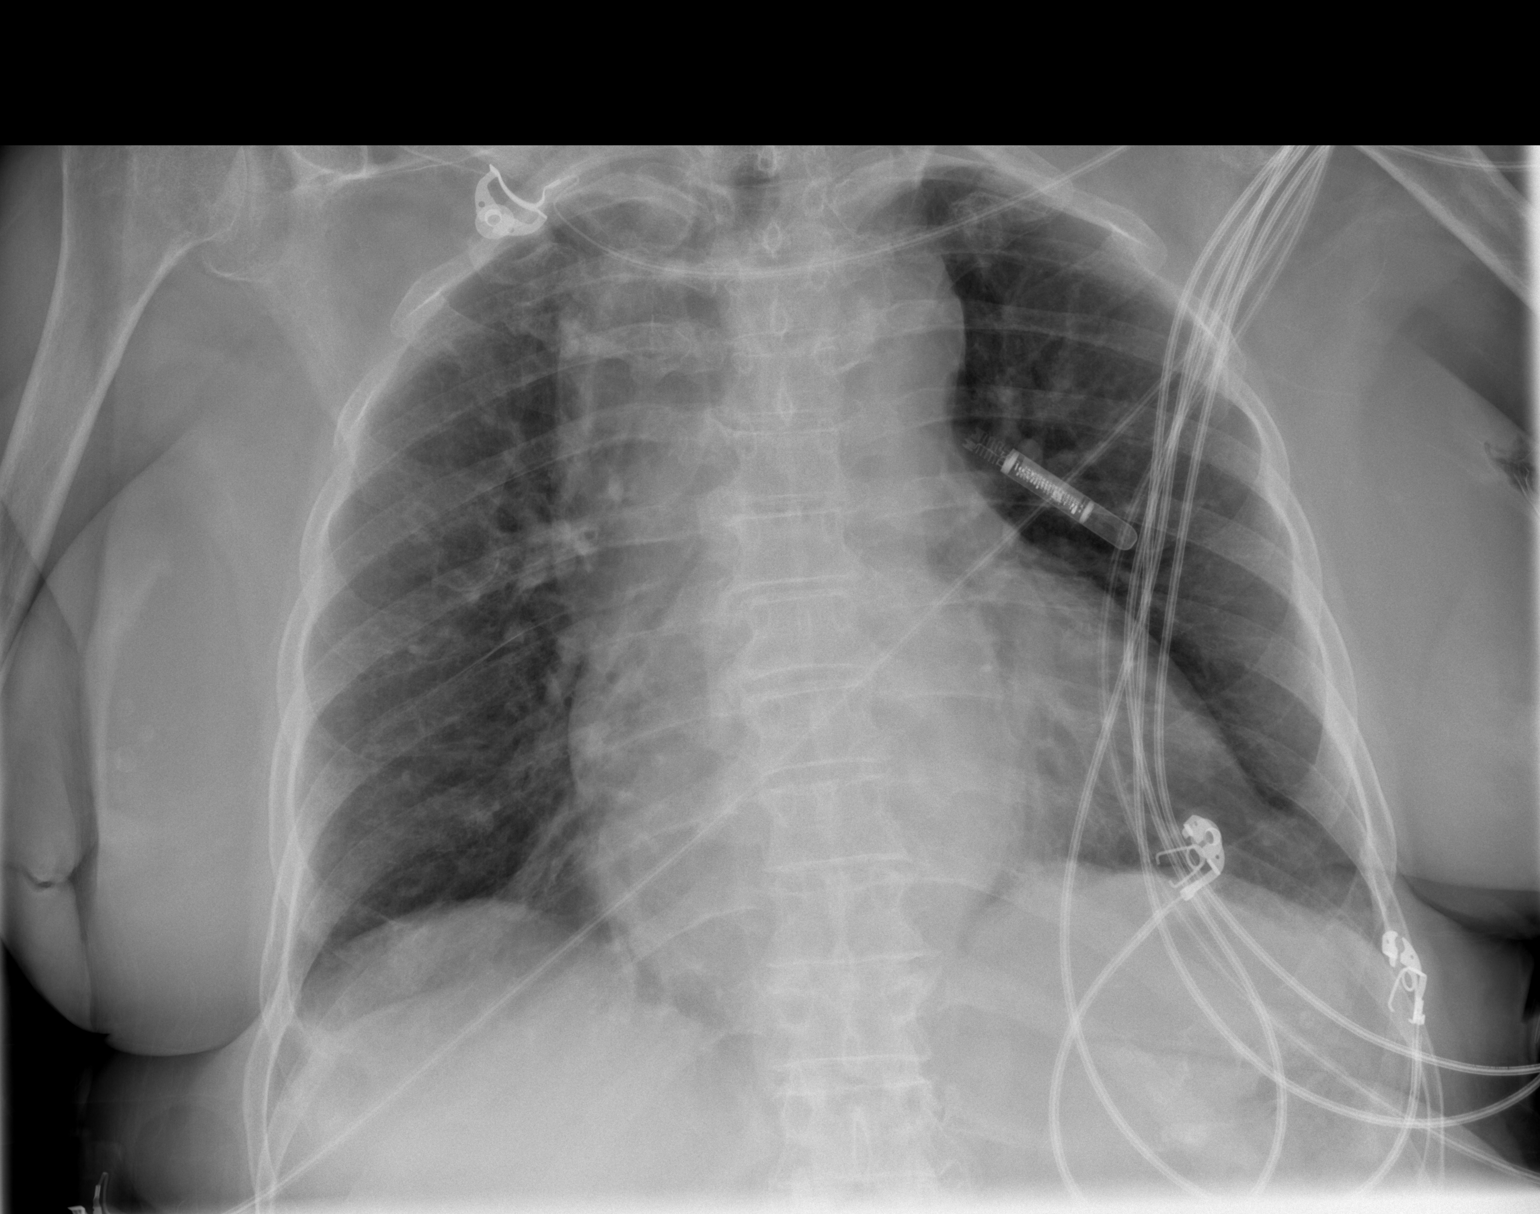

[w chest lat]
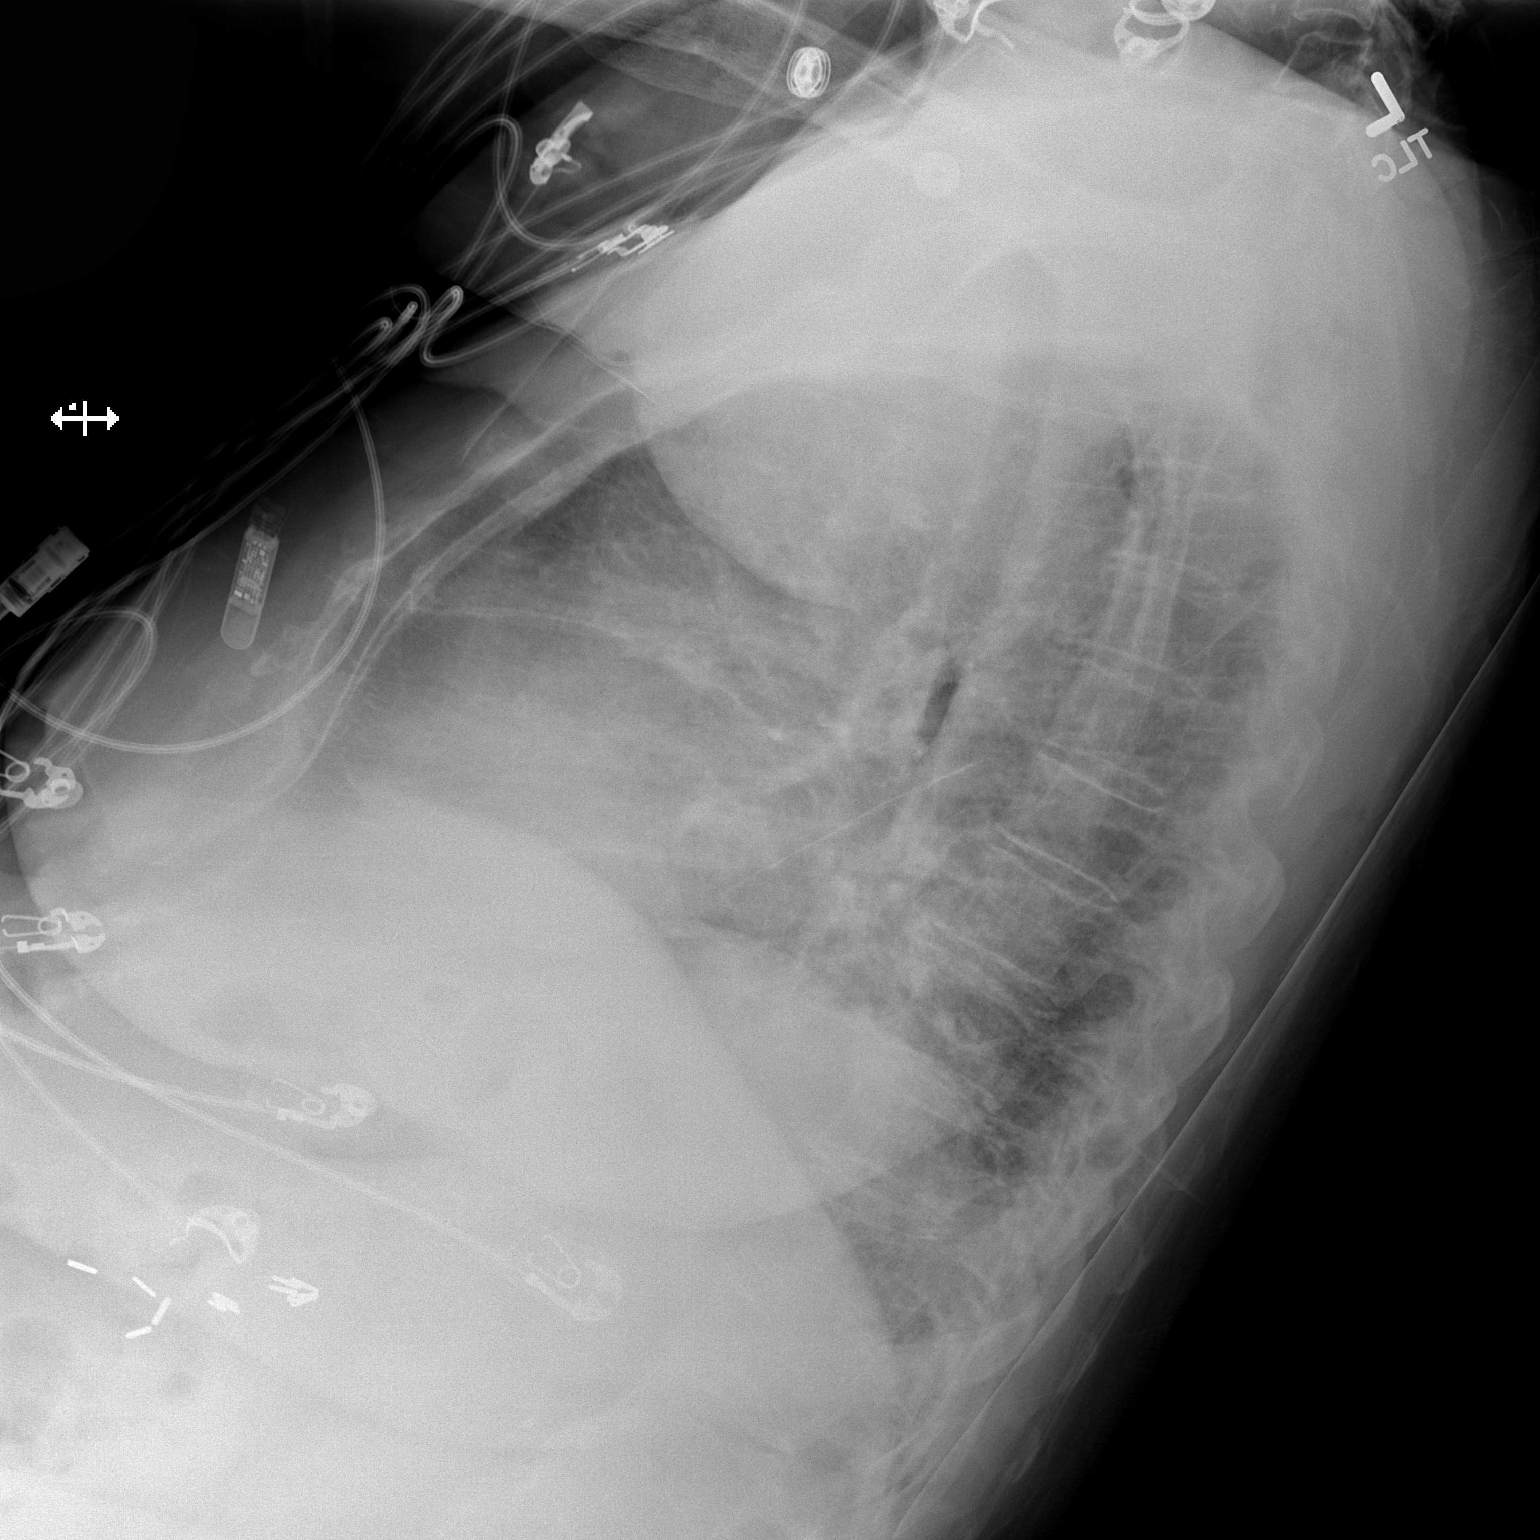

[2 of 2 positions shown; findings below may reference images not displayed]

FINDINGS: The heart is enlarged. Prominent LEFT atrial component. Loop
recorder overlies the LEFT mid chest. There are no focal
consolidations or pleural effusions. No pulmonary edema. There is
deformity of the RIGHT LATERAL 8th rib raising the question of acute
fracture.
IMPRESSION: Cardiomegaly with enlarged RIGHT atrium. No acute pulmonary
abnormality. Question of acute fracture of the RIGHT 8th rib.

## 2019-07-12 ENCOUNTER — Emergency Department (HOSPITAL_COMMUNITY): Payer: Medicare HMO

## 2019-07-12 ENCOUNTER — Other Ambulatory Visit: Payer: Self-pay

## 2019-07-12 ENCOUNTER — Emergency Department (HOSPITAL_COMMUNITY)
Admission: EM | Admit: 2019-07-12 | Discharge: 2019-07-12 | Disposition: A | Discharge status: Home / Self Care | Payer: Medicare HMO | Attending: Emergency Medicine | Admitting: Emergency Medicine

## 2019-07-12 DIAGNOSIS — I1310 Hypertensive heart and chronic kidney disease without heart failure, with stage 1 through stage 4 chronic kidney disease, or unspecified chronic kidney disease: Secondary | ICD-10-CM | POA: Diagnosis not present

## 2019-07-12 DIAGNOSIS — Z7952 Long term (current) use of systemic steroids: Secondary | ICD-10-CM | POA: Insufficient documentation

## 2019-07-12 DIAGNOSIS — R55 Syncope and collapse: Secondary | ICD-10-CM

## 2019-07-12 DIAGNOSIS — Z7901 Long term (current) use of anticoagulants: Secondary | ICD-10-CM | POA: Diagnosis not present

## 2019-07-12 DIAGNOSIS — U071 COVID-19: Secondary | ICD-10-CM | POA: Diagnosis not present

## 2019-07-12 DIAGNOSIS — Z888 Allergy status to other drugs, medicaments and biological substances status: Secondary | ICD-10-CM | POA: Diagnosis not present

## 2019-07-12 DIAGNOSIS — K219 Gastro-esophageal reflux disease without esophagitis: Secondary | ICD-10-CM | POA: Diagnosis not present

## 2019-07-12 DIAGNOSIS — Z96651 Presence of right artificial knee joint: Secondary | ICD-10-CM | POA: Diagnosis not present

## 2019-07-12 DIAGNOSIS — I6782 Cerebral ischemia: Secondary | ICD-10-CM | POA: Diagnosis not present

## 2019-07-12 DIAGNOSIS — I4891 Unspecified atrial fibrillation: Secondary | ICD-10-CM | POA: Insufficient documentation

## 2019-07-12 DIAGNOSIS — N183 Chronic kidney disease, stage 3 unspecified: Secondary | ICD-10-CM | POA: Diagnosis not present

## 2019-07-12 DIAGNOSIS — Z885 Allergy status to narcotic agent status: Secondary | ICD-10-CM | POA: Diagnosis not present

## 2019-07-12 DIAGNOSIS — Z79899 Other long term (current) drug therapy: Secondary | ICD-10-CM | POA: Insufficient documentation

## 2019-07-12 DIAGNOSIS — Z882 Allergy status to sulfonamides status: Secondary | ICD-10-CM | POA: Diagnosis not present

## 2019-07-12 LAB — BASIC METABOLIC PANEL
Anion gap: 12 (ref 5–15)
BUN: 26 mg/dL — ABNORMAL HIGH (ref 8–23)
CO2: 25 mmol/L (ref 22–32)
Calcium: 8.4 mg/dL — ABNORMAL LOW (ref 8.9–10.3)
Chloride: 97 mmol/L — ABNORMAL LOW (ref 98–111)
Creatinine, Ser: 1.19 mg/dL — ABNORMAL HIGH (ref 0.44–1.00)
GFR calc Af Amer: 47 mL/min — ABNORMAL LOW (ref >60)
GFR calc non Af Amer: 40 mL/min — ABNORMAL LOW (ref >60)
Glucose, Bld: 101 mg/dL — ABNORMAL HIGH (ref 70–99)
Potassium: 4.9 mmol/L (ref 3.5–5.1)
Sodium: 134 mmol/L — ABNORMAL LOW (ref 135–145)

## 2019-07-12 LAB — CBC WITH DIFFERENTIAL/PLATELET
Abs Immature Granulocytes: 0.02 10*3/uL (ref 0.00–0.07)
Basophils Absolute: 0 10*3/uL (ref 0.0–0.1)
Basophils Relative: 0 %
Eosinophils Absolute: 0.1 10*3/uL (ref 0.0–0.5)
Eosinophils Relative: 1 %
HCT: 37.6 % (ref 36.0–46.0)
Hemoglobin: 11.9 g/dL — ABNORMAL LOW (ref 12.0–15.0)
Immature Granulocytes: 0 %
Lymphocytes Relative: 25 %
Lymphs Abs: 1.8 10*3/uL (ref 0.7–4.0)
MCH: 29.1 pg (ref 26.0–34.0)
MCHC: 31.6 g/dL (ref 30.0–36.0)
MCV: 91.9 fL (ref 80.0–100.0)
Monocytes Absolute: 0.6 10*3/uL (ref 0.1–1.0)
Monocytes Relative: 9 %
Neutro Abs: 4.5 10*3/uL (ref 1.7–7.7)
Neutrophils Relative %: 65 %
Platelets: 246 10*3/uL (ref 150–400)
RBC: 4.09 MIL/uL (ref 3.87–5.11)
RDW: 13.3 % (ref 11.5–15.5)
WBC: 7 10*3/uL (ref 4.0–10.5)
nRBC: 0 % (ref 0.0–0.2)

## 2019-07-12 LAB — LACTIC ACID, PLASMA: Lactic Acid, Venous: 1.2 mmol/L (ref 0.5–1.9)

## 2019-07-12 LAB — FIBRINOGEN: Fibrinogen: 650 mg/dL — ABNORMAL HIGH (ref 210–475)

## 2019-07-12 LAB — LACTATE DEHYDROGENASE: LDH: 224 U/L — ABNORMAL HIGH (ref 98–192)

## 2019-07-12 LAB — FERRITIN: Ferritin: 106 ng/mL (ref 11–307)

## 2019-07-12 LAB — TRIGLYCERIDES: Triglycerides: 82 mg/dL (ref <150)

## 2019-07-12 LAB — D-DIMER, QUANTITATIVE: D-Dimer, Quant: 1.25 ug/mL-FEU — ABNORMAL HIGH (ref 0.00–0.50)

## 2019-07-12 LAB — TROPONIN I (HIGH SENSITIVITY): Troponin I (High Sensitivity): 7 ng/L (ref <18)

## 2019-07-12 LAB — PROCALCITONIN: Procalcitonin: <0.1 ng/mL

## 2019-07-12 LAB — C-REACTIVE PROTEIN: CRP: 10.2 mg/dL — ABNORMAL HIGH (ref <1.0)

## 2019-07-12 NOTE — ED Notes (Addendum)
[facility] called for transport. Facility report given to supervisor Kennedy Bucker and son is aware of transport status.

## 2019-07-12 NOTE — ED Triage Notes (Signed)
Arrived [facility] from [facility] facility CC witness syncope episode  w/o fall lasting for 1-2 minutes. Pt denies pain but endorses generalized weakness. Per EMS A/OX2   PINK MOST FORM AT BEDSIDE  COVID POSITIVE X 1 week

## 2019-07-12 NOTE — ED Notes (Signed)
Labs collected by IV team

## 2019-07-12 NOTE — ED Notes (Signed)
[facility] nurse supervisor number [phone]  Care center number [phone]  [address] Care center room 110

## 2019-07-12 NOTE — ED Notes (Signed)
[facility] at bedside for transport.  

## 2019-07-12 NOTE — ED Notes (Signed)
IV team at bedside 

## 2019-07-12 NOTE — Discharge Instructions (Signed)
Return to ER if she develops any difficulty in breathing, further episodes of passing out, chest pain, other new concerning symptom.  Otherwise recommend close monitoring at the nursing facility and have the facility provider evaluate patient tomorrow morning for close recheck.

## 2019-07-12 NOTE — ED Notes (Signed)
Pts son in law called Michaelle Birks [phone] and was updated on pts status
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
## 2019-07-12 NOTE — ED Notes (Signed)
Pt verbalized understanding of DC instructions

## 2019-07-14 ENCOUNTER — Emergency Department (HOSPITAL_COMMUNITY): Payer: Medicare HMO

## 2019-07-14 ENCOUNTER — Emergency Department (HOSPITAL_COMMUNITY)
Admission: EM | Admit: 2019-07-14 | Discharge: 2019-07-14 | Disposition: A | Discharge status: Home / Self Care | Payer: Medicare HMO | Attending: Emergency Medicine | Admitting: Emergency Medicine

## 2019-07-14 ENCOUNTER — Telehealth (HOSPITAL_BASED_OUTPATIENT_CLINIC_OR_DEPARTMENT_OTHER): Payer: Self-pay | Admitting: Emergency Medicine

## 2019-07-14 DIAGNOSIS — I129 Hypertensive chronic kidney disease with stage 1 through stage 4 chronic kidney disease, or unspecified chronic kidney disease: Secondary | ICD-10-CM | POA: Diagnosis not present

## 2019-07-14 DIAGNOSIS — Z79899 Other long term (current) drug therapy: Secondary | ICD-10-CM | POA: Insufficient documentation

## 2019-07-14 DIAGNOSIS — Z7901 Long term (current) use of anticoagulants: Secondary | ICD-10-CM | POA: Insufficient documentation

## 2019-07-14 DIAGNOSIS — R7881 Bacteremia: Secondary | ICD-10-CM | POA: Insufficient documentation

## 2019-07-14 DIAGNOSIS — U071 COVID-19: Secondary | ICD-10-CM

## 2019-07-14 DIAGNOSIS — Z96651 Presence of right artificial knee joint: Secondary | ICD-10-CM | POA: Insufficient documentation

## 2019-07-14 DIAGNOSIS — N183 Chronic kidney disease, stage 3 unspecified: Secondary | ICD-10-CM | POA: Diagnosis not present

## 2019-07-14 LAB — CBC WITH DIFFERENTIAL/PLATELET
Abs Immature Granulocytes: 0.04 10*3/uL (ref 0.00–0.07)
Basophils Absolute: 0 10*3/uL (ref 0.0–0.1)
Basophils Relative: 1 %
Eosinophils Absolute: 0.2 10*3/uL (ref 0.0–0.5)
Eosinophils Relative: 2 %
HCT: 37.3 % (ref 36.0–46.0)
Hemoglobin: 11.9 g/dL — ABNORMAL LOW (ref 12.0–15.0)
Immature Granulocytes: 1 %
Lymphocytes Relative: 23 %
Lymphs Abs: 1.9 10*3/uL (ref 0.7–4.0)
MCH: 29 pg (ref 26.0–34.0)
MCHC: 31.9 g/dL (ref 30.0–36.0)
MCV: 91 fL (ref 80.0–100.0)
Monocytes Absolute: 0.9 10*3/uL (ref 0.1–1.0)
Monocytes Relative: 11 %
Neutro Abs: 4.9 10*3/uL (ref 1.7–7.7)
Neutrophils Relative %: 62 %
Platelets: 295 10*3/uL (ref 150–400)
RBC: 4.1 MIL/uL (ref 3.87–5.11)
RDW: 13.2 % (ref 11.5–15.5)
WBC: 7.9 10*3/uL (ref 4.0–10.5)
nRBC: 0 % (ref 0.0–0.2)

## 2019-07-14 LAB — COMPREHENSIVE METABOLIC PANEL
ALT: 13 U/L (ref 0–44)
AST: 23 U/L (ref 15–41)
Albumin: 3.2 g/dL — ABNORMAL LOW (ref 3.5–5.0)
Alkaline Phosphatase: 108 U/L (ref 38–126)
Anion gap: 10 (ref 5–15)
BUN: 29 mg/dL — ABNORMAL HIGH (ref 8–23)
CO2: 25 mmol/L (ref 22–32)
Calcium: 8.5 mg/dL — ABNORMAL LOW (ref 8.9–10.3)
Chloride: 100 mmol/L (ref 98–111)
Creatinine, Ser: 1.06 mg/dL — ABNORMAL HIGH (ref 0.44–1.00)
GFR calc Af Amer: 54 mL/min — ABNORMAL LOW (ref >60)
GFR calc non Af Amer: 46 mL/min — ABNORMAL LOW (ref >60)
Glucose, Bld: 113 mg/dL — ABNORMAL HIGH (ref 70–99)
Potassium: 4.4 mmol/L (ref 3.5–5.1)
Sodium: 135 mmol/L (ref 135–145)
Total Bilirubin: 0.6 mg/dL (ref 0.3–1.2)
Total Protein: 7.2 g/dL (ref 6.5–8.1)

## 2019-07-14 LAB — CULTURE, BLOOD (ROUTINE X 2): Special Requests: ADEQUATE

## 2019-07-14 LAB — LACTIC ACID, PLASMA: Lactic Acid, Venous: 1.7 mmol/L (ref 0.5–1.9)

## 2019-07-14 NOTE — ED Triage Notes (Signed)
Transported by [facility] from [facility]-- recently seen at this facility 2 days ago for low oxygen readings/syncopal episode. Hospital called son this morning stating that patient has "bacterial infection in blood, positive blood cultures?" VSS. History of dementia, AAO x 1 at baseline.

## 2019-07-14 NOTE — Discharge Instructions (Addendum)
The bacteria that showed in the blood appears it is likely a contaminant.  Your lab work is reassuring here all of the blood cultures have been rechecked.  If you have more trouble breathing he could be due to your Covid infection and may require further evaluation.  You will be notified if the blood cultures do show abnormality again.  Follow-up with your doctor
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
## 2019-07-14 NOTE — ED Notes (Signed)
Called [facility] 

## 2019-07-14 NOTE — ED Notes (Signed)
Called [facility] to give report, but no answer. Left a message

## 2019-07-14 NOTE — Telephone Encounter (Signed)
Called with positive blood culture from lab. Consulted with Dr. Nicanor Alcon and advised pt to return to [facility] ED for evaluation. Will have dayshift follow up with pt.

## 2019-07-19 LAB — CULTURE, BLOOD (ROUTINE X 2)
Culture: NO GROWTH
Culture: NO GROWTH
Special Requests: ADEQUATE
Special Requests: ADEQUATE

## 2020-07-06 ENCOUNTER — Other Ambulatory Visit (HOSPITAL_COMMUNITY): Payer: Self-pay | Admitting: *Deleted

## 2020-07-07 ENCOUNTER — Other Ambulatory Visit: Payer: Self-pay

## 2020-07-07 ENCOUNTER — Ambulatory Visit (HOSPITAL_COMMUNITY)
Admission: RE | Admit: 2020-07-07 | Discharge: 2020-07-07 | Disposition: A | Discharge status: Home / Self Care | Payer: Medicare HMO | Source: Ambulatory Visit | Attending: Internal Medicine | Admitting: Internal Medicine

## 2020-07-07 DIAGNOSIS — D649 Anemia, unspecified: Secondary | ICD-10-CM | POA: Insufficient documentation

## 2020-07-07 LAB — PREPARE RBC (CROSSMATCH)

## 2020-07-07 MED ORDER — SODIUM CHLORIDE 0.9% IV SOLUTION: Freq: Once | INTRAVENOUS | Status: DC

## 2020-07-08 LAB — TYPE AND SCREEN
ABO/RH(D): O NEG
Antibody Screen: NEGATIVE
Unit division: 0

## 2020-07-08 LAB — BPAM RBC
Blood Product Expiration Date: 202201082359
ISSUE DATE / TIME: 202201071008
Unit Type and Rh: 9500

## 2021-04-29 ENCOUNTER — Inpatient Hospital Stay (HOSPITAL_COMMUNITY): Payer: Medicare HMO | Admitting: Certified Registered"

## 2021-04-29 ENCOUNTER — Inpatient Hospital Stay (HOSPITAL_COMMUNITY)
Admission: EM | Admit: 2021-04-29 | Discharge: 2021-05-01 | DRG: 481 | Disposition: A | Discharge status: Skilled Nursing Facility | Payer: Medicare HMO | Source: Skilled Nursing Facility | Attending: Internal Medicine | Admitting: Internal Medicine

## 2021-04-29 ENCOUNTER — Inpatient Hospital Stay (HOSPITAL_COMMUNITY): Payer: Medicare HMO

## 2021-04-29 ENCOUNTER — Other Ambulatory Visit: Payer: Self-pay

## 2021-04-29 ENCOUNTER — Encounter (HOSPITAL_COMMUNITY)
Admission: EM | Disposition: A | Discharge status: Skilled Nursing Facility | Payer: Self-pay | Source: Skilled Nursing Facility | Attending: Internal Medicine

## 2021-04-29 ENCOUNTER — Encounter (HOSPITAL_COMMUNITY): Payer: Self-pay

## 2021-04-29 ENCOUNTER — Emergency Department (HOSPITAL_COMMUNITY): Payer: Medicare HMO

## 2021-04-29 DIAGNOSIS — F32A Depression, unspecified: Secondary | ICD-10-CM | POA: Diagnosis present

## 2021-04-29 DIAGNOSIS — J449 Chronic obstructive pulmonary disease, unspecified: Secondary | ICD-10-CM | POA: Diagnosis present

## 2021-04-29 DIAGNOSIS — F419 Anxiety disorder, unspecified: Secondary | ICD-10-CM | POA: Diagnosis present

## 2021-04-29 DIAGNOSIS — K219 Gastro-esophageal reflux disease without esophagitis: Secondary | ICD-10-CM | POA: Diagnosis present

## 2021-04-29 DIAGNOSIS — Z961 Presence of intraocular lens: Secondary | ICD-10-CM | POA: Diagnosis present

## 2021-04-29 DIAGNOSIS — Z9842 Cataract extraction status, left eye: Secondary | ICD-10-CM

## 2021-04-29 DIAGNOSIS — I482 Chronic atrial fibrillation, unspecified: Secondary | ICD-10-CM | POA: Diagnosis present

## 2021-04-29 DIAGNOSIS — Z823 Family history of stroke: Secondary | ICD-10-CM

## 2021-04-29 DIAGNOSIS — S72409A Unspecified fracture of lower end of unspecified femur, initial encounter for closed fracture: Secondary | ICD-10-CM

## 2021-04-29 DIAGNOSIS — W06XXXA Fall from bed, initial encounter: Secondary | ICD-10-CM | POA: Diagnosis present

## 2021-04-29 DIAGNOSIS — M1990 Unspecified osteoarthritis, unspecified site: Secondary | ICD-10-CM | POA: Diagnosis present

## 2021-04-29 DIAGNOSIS — Z885 Allergy status to narcotic agent status: Secondary | ICD-10-CM | POA: Diagnosis not present

## 2021-04-29 DIAGNOSIS — D62 Acute posthemorrhagic anemia: Secondary | ICD-10-CM | POA: Diagnosis not present

## 2021-04-29 DIAGNOSIS — Z90710 Acquired absence of both cervix and uterus: Secondary | ICD-10-CM | POA: Diagnosis not present

## 2021-04-29 DIAGNOSIS — F0390 Unspecified dementia without behavioral disturbance: Secondary | ICD-10-CM

## 2021-04-29 DIAGNOSIS — R112 Nausea with vomiting, unspecified: Secondary | ICD-10-CM | POA: Diagnosis present

## 2021-04-29 DIAGNOSIS — Z90722 Acquired absence of ovaries, bilateral: Secondary | ICD-10-CM

## 2021-04-29 DIAGNOSIS — S72401A Unspecified fracture of lower end of right femur, initial encounter for closed fracture: Principal | ICD-10-CM | POA: Diagnosis present

## 2021-04-29 DIAGNOSIS — Z9049 Acquired absence of other specified parts of digestive tract: Secondary | ICD-10-CM

## 2021-04-29 DIAGNOSIS — E669 Obesity, unspecified: Secondary | ICD-10-CM | POA: Diagnosis present

## 2021-04-29 DIAGNOSIS — Z9841 Cataract extraction status, right eye: Secondary | ICD-10-CM | POA: Diagnosis not present

## 2021-04-29 DIAGNOSIS — Z888 Allergy status to other drugs, medicaments and biological substances status: Secondary | ICD-10-CM

## 2021-04-29 DIAGNOSIS — R5381 Other malaise: Secondary | ICD-10-CM | POA: Diagnosis present

## 2021-04-29 DIAGNOSIS — Z419 Encounter for procedure for purposes other than remedying health state, unspecified: Secondary | ICD-10-CM

## 2021-04-29 DIAGNOSIS — S7291XD Unspecified fracture of right femur, subsequent encounter for closed fracture with routine healing: Secondary | ICD-10-CM

## 2021-04-29 DIAGNOSIS — W19XXXA Unspecified fall, initial encounter: Secondary | ICD-10-CM

## 2021-04-29 DIAGNOSIS — M9711XA Periprosthetic fracture around internal prosthetic right knee joint, initial encounter: Secondary | ICD-10-CM | POA: Diagnosis present

## 2021-04-29 DIAGNOSIS — Y92129 Unspecified place in nursing home as the place of occurrence of the external cause: Secondary | ICD-10-CM | POA: Diagnosis not present

## 2021-04-29 DIAGNOSIS — Z882 Allergy status to sulfonamides status: Secondary | ICD-10-CM

## 2021-04-29 DIAGNOSIS — Z881 Allergy status to other antibiotic agents status: Secondary | ICD-10-CM | POA: Diagnosis not present

## 2021-04-29 DIAGNOSIS — I10 Essential (primary) hypertension: Secondary | ICD-10-CM | POA: Diagnosis present

## 2021-04-29 DIAGNOSIS — Z6833 Body mass index (BMI) 33.0-33.9, adult: Secondary | ICD-10-CM | POA: Diagnosis not present

## 2021-04-29 DIAGNOSIS — Z993 Dependence on wheelchair: Secondary | ICD-10-CM

## 2021-04-29 DIAGNOSIS — S7290XA Unspecified fracture of unspecified femur, initial encounter for closed fracture: Secondary | ICD-10-CM

## 2021-04-29 DIAGNOSIS — S7291XA Unspecified fracture of right femur, initial encounter for closed fracture: Secondary | ICD-10-CM | POA: Diagnosis present

## 2021-04-29 DIAGNOSIS — Z79899 Other long term (current) drug therapy: Secondary | ICD-10-CM

## 2021-04-29 DIAGNOSIS — Z7951 Long term (current) use of inhaled steroids: Secondary | ICD-10-CM

## 2021-04-29 DIAGNOSIS — Z20822 Contact with and (suspected) exposure to covid-19: Secondary | ICD-10-CM | POA: Diagnosis present

## 2021-04-29 HISTORY — PX: ORIF FEMUR FRACTURE: SHX2119

## 2021-04-29 LAB — CBC
HCT: 36.1 % (ref 36.0–46.0)
HCT: 33.7 % — ABNORMAL LOW (ref 36.0–46.0)
Hemoglobin: 11.5 g/dL — ABNORMAL LOW (ref 12.0–15.0)
Hemoglobin: 10.9 g/dL — ABNORMAL LOW (ref 12.0–15.0)
MCH: 29.6 pg (ref 26.0–34.0)
MCH: 29.7 pg (ref 26.0–34.0)
MCHC: 31.9 g/dL (ref 30.0–36.0)
MCHC: 32.3 g/dL (ref 30.0–36.0)
MCV: 93 fL (ref 80.0–100.0)
MCV: 91.8 fL (ref 80.0–100.0)
Platelets: 358 10*3/uL (ref 150–400)
Platelets: 337 10*3/uL (ref 150–400)
RBC: 3.88 MIL/uL (ref 3.87–5.11)
RBC: 3.67 MIL/uL — ABNORMAL LOW (ref 3.87–5.11)
RDW: 13.6 % (ref 11.5–15.5)
RDW: 13.5 % (ref 11.5–15.5)
WBC: 13.6 10*3/uL — ABNORMAL HIGH (ref 4.0–10.5)
WBC: 10.4 10*3/uL (ref 4.0–10.5)
nRBC: 0 % (ref 0.0–0.2)
nRBC: 0 % (ref 0.0–0.2)

## 2021-04-29 LAB — BASIC METABOLIC PANEL
Anion gap: 11 (ref 5–15)
Anion gap: 10 (ref 5–15)
BUN: 25 mg/dL — ABNORMAL HIGH (ref 8–23)
BUN: 24 mg/dL — ABNORMAL HIGH (ref 8–23)
CO2: 26 mmol/L (ref 22–32)
CO2: 28 mmol/L (ref 22–32)
Calcium: 8.9 mg/dL (ref 8.9–10.3)
Calcium: 8.7 mg/dL — ABNORMAL LOW (ref 8.9–10.3)
Chloride: 98 mmol/L (ref 98–111)
Chloride: 97 mmol/L — ABNORMAL LOW (ref 98–111)
Creatinine, Ser: 1 mg/dL (ref 0.44–1.00)
Creatinine, Ser: 1.03 mg/dL — ABNORMAL HIGH (ref 0.44–1.00)
GFR, Estimated: 53 mL/min — ABNORMAL LOW (ref >60)
GFR, Estimated: 51 mL/min — ABNORMAL LOW (ref >60)
Glucose, Bld: 148 mg/dL — ABNORMAL HIGH (ref 70–99)
Glucose, Bld: 116 mg/dL — ABNORMAL HIGH (ref 70–99)
Potassium: 3.8 mmol/L (ref 3.5–5.1)
Potassium: 4.1 mmol/L (ref 3.5–5.1)
Sodium: 135 mmol/L (ref 135–145)
Sodium: 135 mmol/L (ref 135–145)

## 2021-04-29 LAB — CBC WITH DIFFERENTIAL/PLATELET
Abs Immature Granulocytes: 0.04 10*3/uL (ref 0.00–0.07)
Basophils Absolute: 0.1 10*3/uL (ref 0.0–0.1)
Basophils Relative: 1 %
Eosinophils Absolute: 0.2 10*3/uL (ref 0.0–0.5)
Eosinophils Relative: 2 %
HCT: 34.7 % — ABNORMAL LOW (ref 36.0–46.0)
Hemoglobin: 11.1 g/dL — ABNORMAL LOW (ref 12.0–15.0)
Immature Granulocytes: 0 %
Lymphocytes Relative: 43 %
Lymphs Abs: 4.6 10*3/uL — ABNORMAL HIGH (ref 0.7–4.0)
MCH: 29.5 pg (ref 26.0–34.0)
MCHC: 32 g/dL (ref 30.0–36.0)
MCV: 92.3 fL (ref 80.0–100.0)
Monocytes Absolute: 0.9 10*3/uL (ref 0.1–1.0)
Monocytes Relative: 9 %
Neutro Abs: 5 10*3/uL (ref 1.7–7.7)
Neutrophils Relative %: 45 %
Platelets: 318 10*3/uL (ref 150–400)
RBC: 3.76 MIL/uL — ABNORMAL LOW (ref 3.87–5.11)
RDW: 13.5 % (ref 11.5–15.5)
WBC: 10.9 10*3/uL — ABNORMAL HIGH (ref 4.0–10.5)
nRBC: 0 % (ref 0.0–0.2)

## 2021-04-29 LAB — RESP PANEL BY RT-PCR (FLU A&B, COVID) ARPGX2
Influenza A by PCR: NEGATIVE
Influenza B by PCR: NEGATIVE
SARS Coronavirus 2 by RT PCR: NEGATIVE

## 2021-04-29 LAB — TYPE AND SCREEN
ABO/RH(D): O NEG
Antibody Screen: POSITIVE
PT AG Type: NEGATIVE

## 2021-04-29 LAB — PROTIME-INR
INR: 0.9 (ref 0.8–1.2)
Prothrombin Time: 12.4 seconds (ref 11.4–15.2)

## 2021-04-29 LAB — PHOSPHORUS: Phosphorus: 4 mg/dL (ref 2.5–4.6)

## 2021-04-29 LAB — CREATININE, SERUM
Creatinine, Ser: 1.14 mg/dL — ABNORMAL HIGH (ref 0.44–1.00)
GFR, Estimated: 45 mL/min — ABNORMAL LOW (ref >60)

## 2021-04-29 LAB — SURGICAL PCR SCREEN
MRSA, PCR: POSITIVE — AB
Staphylococcus aureus: POSITIVE — AB

## 2021-04-29 LAB — MAGNESIUM: Magnesium: 2.3 mg/dL (ref 1.7–2.4)

## 2021-04-29 SURGERY — OPEN REDUCTION INTERNAL FIXATION (ORIF) DISTAL FEMUR FRACTURE
Anesthesia: General | Laterality: Right

## 2021-04-29 MED ORDER — LACTATED RINGERS IV SOLN: INTRAVENOUS | Status: DC

## 2021-04-29 MED ORDER — VANCOMYCIN HCL 1000 MG IV SOLR
INTRAVENOUS | Status: DC | PRN
  Administered 2021-04-29: 1000 mg

## 2021-04-29 MED ORDER — POLYETHYLENE GLYCOL 3350 17 G PO PACK: 17.0000 g | PACK | Freq: Every day | ORAL | Status: DC | PRN

## 2021-04-29 MED ORDER — LIDOCAINE 2% (20 MG/ML) 5 ML SYRINGE
INTRAMUSCULAR | Status: DC | PRN
  Administered 2021-04-29: 30 mg via INTRAVENOUS

## 2021-04-29 MED ORDER — MUPIROCIN 2 % EX OINT
1.0000 "application " | TOPICAL_OINTMENT | Freq: Two times a day (BID) | CUTANEOUS | Status: DC
  Administered 2021-04-29 – 2021-05-01 (×4): 1 via NASAL
  Filled 2021-04-29: qty 22

## 2021-04-29 MED ORDER — METOCLOPRAMIDE HCL 5 MG/ML IJ SOLN: 5.0000 mg | Freq: Three times a day (TID) | INTRAMUSCULAR | Status: DC | PRN

## 2021-04-29 MED ORDER — CHLORHEXIDINE GLUCONATE CLOTH 2 % EX PADS
6.0000 | MEDICATED_PAD | Freq: Every day | CUTANEOUS | Status: DC
  Administered 2021-04-30 – 2021-05-01 (×2): 6 via TOPICAL

## 2021-04-29 MED ORDER — ACETAMINOPHEN 10 MG/ML IV SOLN
INTRAVENOUS | Status: DC | PRN
  Administered 2021-04-29: 1000 mg via INTRAVENOUS

## 2021-04-29 MED ORDER — PROPOFOL 10 MG/ML IV BOLUS
INTRAVENOUS | Status: AC
  Filled 2021-04-29: qty 20

## 2021-04-29 MED ORDER — METOCLOPRAMIDE HCL 5 MG PO TABS: 5.0000 mg | ORAL_TABLET | Freq: Three times a day (TID) | ORAL | Status: DC | PRN

## 2021-04-29 MED ORDER — MORPHINE SULFATE (PF) 2 MG/ML IV SOLN
0.5000 mg | INTRAVENOUS | Status: DC | PRN
  Administered 2021-04-29 – 2021-04-30 (×5): 0.5 mg via INTRAVENOUS
  Filled 2021-04-29 (×5): qty 1

## 2021-04-29 MED ORDER — FENTANYL CITRATE (PF) 250 MCG/5ML IJ SOLN
INTRAMUSCULAR | Status: DC | PRN
  Administered 2021-04-29 (×3): 50 ug via INTRAVENOUS

## 2021-04-29 MED ORDER — 0.9 % SODIUM CHLORIDE (POUR BTL) OPTIME
TOPICAL | Status: DC | PRN
  Administered 2021-04-29: 1000 mL

## 2021-04-29 MED ORDER — LIDOCAINE 2% (20 MG/ML) 5 ML SYRINGE
INTRAMUSCULAR | Status: AC
  Filled 2021-04-29: qty 5

## 2021-04-29 MED ORDER — VANCOMYCIN HCL 1000 MG IV SOLR
INTRAVENOUS | Status: AC
  Filled 2021-04-29: qty 20

## 2021-04-29 MED ORDER — ORAL CARE MOUTH RINSE: 15.0000 mL | Freq: Once | OROMUCOSAL | Status: AC

## 2021-04-29 MED ORDER — FENTANYL CITRATE (PF) 100 MCG/2ML IJ SOLN: 25.0000 ug | INTRAMUSCULAR | Status: DC | PRN

## 2021-04-29 MED ORDER — ONDANSETRON HCL 4 MG/2ML IJ SOLN
INTRAMUSCULAR | Status: DC | PRN
  Administered 2021-04-29: 4 mg via INTRAVENOUS

## 2021-04-29 MED ORDER — HYDROCODONE-ACETAMINOPHEN 5-325 MG PO TABS
1.0000 | ORAL_TABLET | Freq: Four times a day (QID) | ORAL | Status: DC | PRN
  Administered 2021-04-29: 06:00:00 2 via ORAL
  Administered 2021-04-30 – 2021-05-01 (×4): 1 via ORAL
  Administered 2021-05-01: 2 via ORAL
  Filled 2021-04-29 (×2): qty 1
  Filled 2021-04-29 (×2): qty 2
  Filled 2021-04-29 (×2): qty 1

## 2021-04-29 MED ORDER — ONDANSETRON HCL 4 MG/2ML IJ SOLN: 4.0000 mg | Freq: Once | INTRAMUSCULAR | Status: DC | PRN

## 2021-04-29 MED ORDER — FENTANYL CITRATE (PF) 250 MCG/5ML IJ SOLN
INTRAMUSCULAR | Status: AC
  Filled 2021-04-29: qty 5

## 2021-04-29 MED ORDER — VANCOMYCIN HCL IN DEXTROSE 1-5 GM/200ML-% IV SOLN
1000.0000 mg | Freq: Once | INTRAVENOUS | Status: AC
  Administered 2021-04-29: 1000 mg via INTRAVENOUS

## 2021-04-29 MED ORDER — ONDANSETRON HCL 4 MG PO TABS: 4.0000 mg | ORAL_TABLET | Freq: Four times a day (QID) | ORAL | Status: DC | PRN

## 2021-04-29 MED ORDER — CHLORHEXIDINE GLUCONATE 0.12 % MT SOLN
15.0000 mL | Freq: Once | OROMUCOSAL | Status: AC
  Administered 2021-04-29: 15 mL via OROMUCOSAL
  Filled 2021-04-29: qty 15

## 2021-04-29 MED ORDER — LACTATED RINGERS IV SOLN: INTRAVENOUS | Status: AC

## 2021-04-29 MED ORDER — FENTANYL CITRATE (PF) 100 MCG/2ML IJ SOLN
INTRAMUSCULAR | Status: AC
  Filled 2021-04-29: qty 2

## 2021-04-29 MED ORDER — ONDANSETRON HCL 4 MG/2ML IJ SOLN: 4.0000 mg | Freq: Four times a day (QID) | INTRAMUSCULAR | Status: DC | PRN

## 2021-04-29 MED ORDER — VANCOMYCIN HCL IN DEXTROSE 1-5 GM/200ML-% IV SOLN
INTRAVENOUS | Status: AC
  Administered 2021-04-29: 1000 mg via INTRAVENOUS
  Filled 2021-04-29: qty 200

## 2021-04-29 MED ORDER — VANCOMYCIN HCL 500 MG/100ML IV SOLN
500.0000 mg | Freq: Once | INTRAVENOUS | Status: AC
  Administered 2021-04-30: 500 mg via INTRAVENOUS
  Filled 2021-04-29: qty 100

## 2021-04-29 MED ORDER — ONDANSETRON HCL 4 MG/2ML IJ SOLN
INTRAMUSCULAR | Status: AC
  Filled 2021-04-29: qty 2

## 2021-04-29 MED ORDER — MORPHINE SULFATE (PF) 4 MG/ML IV SOLN
4.0000 mg | Freq: Once | INTRAVENOUS | Status: AC
  Administered 2021-04-29: 01:00:00 4 mg via INTRAVENOUS
  Filled 2021-04-29: qty 1

## 2021-04-29 MED ORDER — ROCURONIUM BROMIDE 10 MG/ML (PF) SYRINGE
PREFILLED_SYRINGE | INTRAVENOUS | Status: DC | PRN
  Administered 2021-04-29: 50 mg via INTRAVENOUS

## 2021-04-29 MED ORDER — SUGAMMADEX SODIUM 200 MG/2ML IV SOLN
INTRAVENOUS | Status: DC | PRN
  Administered 2021-04-29: 200 mg via INTRAVENOUS

## 2021-04-29 MED ORDER — DOCUSATE SODIUM 100 MG PO CAPS
100.0000 mg | ORAL_CAPSULE | Freq: Two times a day (BID) | ORAL | Status: DC
  Administered 2021-04-29 – 2021-05-01 (×4): 100 mg via ORAL
  Filled 2021-04-29 (×4): qty 1

## 2021-04-29 MED ORDER — ROCURONIUM BROMIDE 10 MG/ML (PF) SYRINGE
PREFILLED_SYRINGE | INTRAVENOUS | Status: AC
  Filled 2021-04-29: qty 10

## 2021-04-29 MED ORDER — PROPOFOL 10 MG/ML IV BOLUS
INTRAVENOUS | Status: DC | PRN
  Administered 2021-04-29: 100 mg via INTRAVENOUS

## 2021-04-29 MED ORDER — AMLODIPINE BESYLATE 5 MG PO TABS
5.0000 mg | ORAL_TABLET | Freq: Every day | ORAL | Status: DC
  Administered 2021-04-29 – 2021-05-01 (×3): 5 mg via ORAL
  Filled 2021-04-29 (×3): qty 1

## 2021-04-29 MED ORDER — ENOXAPARIN SODIUM 40 MG/0.4ML IJ SOSY
40.0000 mg | PREFILLED_SYRINGE | INTRAMUSCULAR | Status: DC
  Administered 2021-04-30 – 2021-05-01 (×2): 40 mg via SUBCUTANEOUS
  Filled 2021-04-29 (×2): qty 0.4

## 2021-04-29 MED ORDER — ENOXAPARIN SODIUM 40 MG/0.4ML IJ SOSY: 40.0000 mg | PREFILLED_SYRINGE | Freq: Every day | INTRAMUSCULAR | Status: DC

## 2021-04-29 MED ORDER — ACETAMINOPHEN 10 MG/ML IV SOLN
INTRAVENOUS | Status: AC
  Filled 2021-04-29: qty 100

## 2021-04-29 SURGICAL SUPPLY — 78 items
BAG COUNTER SPONGE SURGICOUNT (BAG) ×2 IMPLANT
BAG SURGICOUNT SPONGE COUNTING (BAG) ×1
BIT DRILL 4.3 (BIT) ×2
BIT DRILL 4.3MM (BIT) ×1
BIT DRILL 4.3X300MM (BIT) IMPLANT
BIT DRILL LONG 3.3 (BIT) ×1 IMPLANT
BIT DRILL LONG 3.3MM (BIT) ×1
BIT DRILL QC 3.3X195 (BIT) ×2 IMPLANT
BLADE CLIPPER SURG (BLADE) IMPLANT
BNDG COHESIVE 6X5 TAN STRL LF (GAUZE/BANDAGES/DRESSINGS) ×3 IMPLANT
BNDG ELASTIC 4X5.8 VLCR STR LF (GAUZE/BANDAGES/DRESSINGS) ×2 IMPLANT
BNDG ELASTIC 6X10 VLCR STRL LF (GAUZE/BANDAGES/DRESSINGS) ×3 IMPLANT
BNDG ELASTIC 6X15 VLCR STRL LF (GAUZE/BANDAGES/DRESSINGS) ×2 IMPLANT
BRUSH SCRUB EZ PLAIN DRY (MISCELLANEOUS) ×6 IMPLANT
CANISTER SUCT 3000ML PPV (MISCELLANEOUS) ×3 IMPLANT
CAP LOCK NCB (Cap) ×20 IMPLANT
CHLORAPREP W/TINT 26 (MISCELLANEOUS) ×3 IMPLANT
COVER SURGICAL LIGHT HANDLE (MISCELLANEOUS) ×3 IMPLANT
DRAPE C-ARM 42X72 X-RAY (DRAPES) ×3 IMPLANT
DRAPE C-ARMOR (DRAPES) ×3 IMPLANT
DRAPE HALF SHEET 40X57 (DRAPES) ×6 IMPLANT
DRAPE ORTHO SPLIT 77X108 STRL (DRAPES) ×6
DRAPE SURG 17X23 STRL (DRAPES) ×3 IMPLANT
DRAPE SURG ORHT 6 SPLT 77X108 (DRAPES) ×2 IMPLANT
DRAPE U-SHAPE 47X51 STRL (DRAPES) ×3 IMPLANT
DRSG ADAPTIC 3X8 NADH LF (GAUZE/BANDAGES/DRESSINGS) IMPLANT
DRSG MEPILEX BORDER 4X12 (GAUZE/BANDAGES/DRESSINGS) IMPLANT
DRSG MEPILEX BORDER 4X4 (GAUZE/BANDAGES/DRESSINGS) IMPLANT
DRSG MEPILEX BORDER 4X8 (GAUZE/BANDAGES/DRESSINGS) IMPLANT
DRSG PAD ABDOMINAL 8X10 ST (GAUZE/BANDAGES/DRESSINGS) ×9 IMPLANT
ELECT REM PT RETURN 9FT ADLT (ELECTROSURGICAL) ×3
ELECTRODE REM PT RTRN 9FT ADLT (ELECTROSURGICAL) ×1 IMPLANT
GAUZE SPONGE 4X4 12PLY STRL (GAUZE/BANDAGES/DRESSINGS) ×3 IMPLANT
GAUZE SPONGE 4X4 12PLY STRL LF (GAUZE/BANDAGES/DRESSINGS) ×2 IMPLANT
GLOVE SURG ENC MOIS LTX SZ6.5 (GLOVE) ×9 IMPLANT
GLOVE SURG ENC MOIS LTX SZ7.5 (GLOVE) ×12 IMPLANT
GLOVE SURG UNDER POLY LF SZ6.5 (GLOVE) ×3 IMPLANT
GLOVE SURG UNDER POLY LF SZ7.5 (GLOVE) ×3 IMPLANT
GOWN STRL REUS W/ TWL LRG LVL3 (GOWN DISPOSABLE) ×3 IMPLANT
GOWN STRL REUS W/TWL LRG LVL3 (GOWN DISPOSABLE) ×9
K-WIRE 2.0 (WIRE) ×3
K-WIRE FXSTD 280X2XNS SS (WIRE) ×1
KIT BASIN OR (CUSTOM PROCEDURE TRAY) ×3 IMPLANT
KIT TURNOVER KIT B (KITS) ×3 IMPLANT
KWIRE FXSTD 280X2XNS SS (WIRE) IMPLANT
NS IRRIG 1000ML POUR BTL (IV SOLUTION) ×3 IMPLANT
PACK TOTAL JOINT (CUSTOM PROCEDURE TRAY) ×3 IMPLANT
PAD ARMBOARD 7.5X6 YLW CONV (MISCELLANEOUS) ×6 IMPLANT
PAD CAST 4YDX4 CTTN HI CHSV (CAST SUPPLIES) ×1 IMPLANT
PADDING CAST ABS 4INX4YD NS (CAST SUPPLIES) ×2
PADDING CAST ABS 6INX4YD NS (CAST SUPPLIES) ×2
PADDING CAST ABS COTTON 4X4 ST (CAST SUPPLIES) IMPLANT
PADDING CAST ABS COTTON 6X4 NS (CAST SUPPLIES) IMPLANT
PADDING CAST COTTON 4X4 STRL (CAST SUPPLIES) ×3
PADDING CAST COTTON 6X4 STRL (CAST SUPPLIES) ×3 IMPLANT
PLATE DISTAL FEMUR 15H 317M RT (Plate) ×2 IMPLANT
SCREW 5.0 80MM (Screw) ×8 IMPLANT
SCREW CORT NCB SELFTAP 5.0X42 (Screw) ×2 IMPLANT
SCREW CORTICAL NCB 5.0X40 (Screw) ×2 IMPLANT
SCREW CORTICAL NCB 5.0X65 (Screw) ×2 IMPLANT
SCREW NCB 3.5X75X5X6.2XST (Screw) IMPLANT
SCREW NCB 4.0X36MM (Screw) ×2 IMPLANT
SCREW NCB 5.0X38 (Screw) ×2 IMPLANT
SCREW NCB 5.0X75MM (Screw) ×3 IMPLANT
SPONGE T-LAP 18X18 ~~LOC~~+RFID (SPONGE) IMPLANT
STAPLER VISISTAT 35W (STAPLE) ×3 IMPLANT
SUCTION FRAZIER HANDLE 10FR (MISCELLANEOUS) ×3
SUCTION TUBE FRAZIER 10FR DISP (MISCELLANEOUS) ×1 IMPLANT
SUT ETHILON 3 0 PS 1 (SUTURE) ×6 IMPLANT
SUT VIC AB 0 CT1 27 (SUTURE)
SUT VIC AB 0 CT1 27XBRD ANBCTR (SUTURE) IMPLANT
SUT VIC AB 1 CT1 27 (SUTURE)
SUT VIC AB 1 CT1 27XBRD ANBCTR (SUTURE) IMPLANT
SUT VIC AB 2-0 CT1 27 (SUTURE) ×6
SUT VIC AB 2-0 CT1 TAPERPNT 27 (SUTURE) ×2 IMPLANT
TOWEL GREEN STERILE (TOWEL DISPOSABLE) ×6 IMPLANT
TRAY FOLEY MTR SLVR 16FR STAT (SET/KITS/TRAYS/PACK) IMPLANT
WATER STERILE IRR 1000ML POUR (IV SOLUTION) ×6 IMPLANT

## 2021-04-29 NOTE — Plan of Care (Signed)

## 2021-04-29 NOTE — ED Triage Notes (Signed)
Patient given Fentanyl and 4mg  Zofran IV.

## 2021-04-29 NOTE — ED Notes (Signed)
Knee immobilizer applied to right leg

## 2021-04-29 NOTE — Transfer of Care (Signed)
Immediate Anesthesia Transfer of Care Note  Patient: Kristen Garrison  Procedure(s) Performed: OPEN REDUCTION INTERNAL FIXATION (ORIF) DISTAL FEMUR FRACTURE (Right)  Patient Location: PACU  Anesthesia Type:General  Level of Consciousness: drowsy  Airway & Oxygen Therapy: Patient Spontanous Breathing and Patient connected to face mask oxygen  Post-op Assessment: Report given to RN and Post -op Vital signs reviewed and stable  Post vital signs: Reviewed and stable  Last Vitals:  Vitals Value Taken Time  BP 155/53 04/29/21 1625  Temp    Pulse 71 04/29/21 1627  Resp 19 04/29/21 1627  SpO2 93 % 04/29/21 1627  Vitals shown include unvalidated device data.  Last Pain:  Vitals:   04/29/21 1344  TempSrc: Oral  PainSc:          Complications: No notable events documented.

## 2021-04-29 NOTE — Progress Notes (Signed)
Pt arrived to [facility] 5N17 via [facility]. See assessment. Will continue to monitor.

## 2021-04-29 NOTE — Progress Notes (Signed)
Pt returned to room 5N17 via bed after surgery. Received report from Vernona Rieger, RN in PACU. See reassessment. Will continue to monitor.

## 2021-04-29 NOTE — Progress Notes (Signed)
PHARMACY NOTE:  ANTIMICROBIAL RENAL DOSAGE ADJUSTMENT  Current antimicrobial regimen includes a mismatch between antimicrobial dosage and estimated renal function.  As per policy approved by the Pharmacy & Therapeutics and Medical Executive Committees, the antimicrobial dosage will be adjusted accordingly.  Current antimicrobial dosage:  vancomycin 1g Q12 hr x1 dose  Indication: surgical prophylaxis   Renal Function:  Estimated Creatinine Clearance: 37.5 mL/min (A) (by C-G formula based on SCr of 1.03 mg/dL (H)).     Antimicrobial dosage has been changed to:  500mg  x1   Additional comments: received vancomycin 1g periop at 15:00 today. Patient is 87 years old. Decreased to 500mg  x1 and timed for tomorrow morning to decrease AKI risk.   Thank you for allowing pharmacy to be a part of this patient's care.  99, PharmD, BCPS, BCCP Clinical Pharmacist  Please check AMION for all [facility] Pharmacy phone numbers After 10:00 PM, call Main Pharmacy [phone]
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
## 2021-04-29 NOTE — ED Triage Notes (Signed)
Patient BIB [facility] from [facility]. Patient fell out of her bed. Injured her right knee. Patient had a staggered fall. Went from a foot off the floor.

## 2021-04-29 NOTE — ED Notes (Signed)
Received secure chat from Katha Hamming, RN saying she has reviewed the patient and is ready to receive the patient. Will call [facility] for transport.

## 2021-04-29 NOTE — Anesthesia Postprocedure Evaluation (Signed)
Anesthesia Post Note  Patient: Kristen Garrison  Procedure(s) Performed: OPEN REDUCTION INTERNAL FIXATION (ORIF) DISTAL FEMUR FRACTURE (Right)     Patient location during evaluation: PACU Anesthesia Type: General Level of consciousness: awake and alert Pain management: pain level controlled Vital Signs Assessment: post-procedure vital signs reviewed and stable Respiratory status: spontaneous breathing, nonlabored ventilation, respiratory function stable and patient connected to nasal cannula oxygen Cardiovascular status: blood pressure returned to baseline and stable Postop Assessment: no apparent nausea or vomiting Anesthetic complications: no   No notable events documented.  Last Vitals:  Vitals:   04/29/21 1810 04/29/21 1827  BP: 120/80 124/83  Pulse: 73 79  Resp: 14 14  Temp: 36.5 C 37.1 C  SpO2: 99% 92%    Last Pain:  Vitals:   04/29/21 1827  TempSrc: Oral  PainSc: 0-No pain                 Cecile Hearing
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
## 2021-04-29 NOTE — Progress Notes (Addendum)
Patient is , admitted early this morning, detail please refer to HPI  87 year old female  from [facility] with history of dementia, A. fib, taken off anticoagulation, debility baseline bed to wheelchair bound, presented to [facility] due to fall and right knee pain, found to have right distal femoral shaft fracture, Ortho consulted request patient transferred to [facility] for surgery  She currently denies pain, she is alert and interactive, she is not oriented to time, this is her baseline.   Husband updated at bedside.

## 2021-04-29 NOTE — ED Notes (Signed)
Attempted to call 5N at [facility] to have them put in purple man information, no one answer. Will attempt again in 5 mins.

## 2021-04-29 NOTE — Progress Notes (Signed)
Pre-op report called and given to Olegario Messier, RN in Short Stay. All questions answered to satisfaction. OR personnel to transport pt.

## 2021-04-29 NOTE — Op Note (Signed)
Orthopaedic Surgery Operative Note (CSN: [id number] ) Date of Surgery: 04/29/2021  Admit Date: 04/29/2021   Diagnoses: Pre-Op Diagnoses: Right periprosthetic distal femur fracture  Post-Op Diagnosis: Same  Procedures: CPT 27511-Open reduction internal fixation of right distal femur  Surgeons : Primary: Roby Lofts, MD  Assistant: Ulyses Southward, PA-C  Location: OR 3   Anesthesia:General   Antibiotics: Vancomycin 1000mg  IV preop and 1 gm vancomycin powder placed topically   Tourniquet time: None    Estimated Blood Loss:20 mL  Complications:None   Specimens:None   Implants: Implant Name Type Inv. Item Serial No. Manufacturer Lot No. LRB No. Used Action  PLATE DISTAL FEMUR 15H 317M RT - Plate PLATE DISTAL FEMUR 15H 317M RT  [employer](ORTH,TRAU,BIO,SG)  Right 4 Implanted  SCREW 5.0 [id number] - Screw SCREW 5.0 [id number]  [employer](ORTH,TRAU,BIO,SG)  Right 1 Implanted  SCREW CORTICAL NCB 5.0X65 - Screw SCREW CORTICAL NCB 5.0X65  [employer](ORTH,TRAU,BIO,SG)  Right 1 Implanted  SCREW NCB 4.0X36MM - [id number] Screw SCREW NCB 4.0X36MM  [employer](ORTH,TRAU,BIO,SG)  Right 1 Implanted  CAP LOCK NCB - [id number] Cap CAP LOCK NCB  [employer](ORTH,TRAU,BIO,SG)  Right 10 Implanted     Indications for Surgery: 87 year old female who sustained a right periprosthetic distal femur fracture.  Due to the unstable nature of her injury I recommend proceeding with open reduction internal fixation.  Risks and benefits were discussed with the patient and her son.  Risks included but not limited to bleeding, infection, malunion, nonunion, hardware failure, hardware irritation, nerve or blood vessel injury, DVT, even the possibility anesthetic complications.  They agreed to proceed with surgery and consent was obtained.  Operative Findings: Open reduction internal fixation of right periprosthetic distal femur fracture using [employer] NCB distal femoral locking  plate  Procedure: The patient was identified in the preoperative holding area. Consent was confirmed with the patient and their family and all questions were answered. The operative extremity was marked after confirmation with the patient. she was then brought back to the operating room by our anesthesia colleagues.  She was placed under general anesthetic and carefully transferred over to a radiolucent flat top table.  A bump was placed under her operative hip.  The right lower extremity was then prepped and draped in usual sterile fashion.  A timeout was performed to verify the patient, the procedure, and the extremity.  Preoperative antibiotics were dosed.  Fluoroscopic imaging was obtained to show the unstable nature of her injury.  The hip and knee were flexed over a triangle.  A lateral approach to the distal femur was carried down through skin and subcutaneous tissue.  I split the IT band in line with my incision.  I then exposed the lateral condyle of the femur.  I then attached a 15 hole [employer] NCB plate to a targeting arm and slid this submuscularly along the lateral cortex of the femur.  I held provisionally distally with a 2.0 mm K wire proximally percutaneously placed a 3.3 mm drill bit to align the proximal portion of the plate.  This was after aligning the fracture appropriately on AP and lateral imaging using traction and the triangle as a bump.  I drilled and placed a 5.0 millimeter screws distally to bring the plate flush to bone.  Then percutaneously placed 5.0 millimeter screws in the femoral shaft.  I then removed the 3.3 mm drill bit in the proximal hole and placed a 4.0 millimeter screw.  I placed locking caps on  all a 5.0 millimeter screws.  I then returned to the distal segment and placed 5.0 mm screws.  A total of 6 were placed distally.  Locking caps were placed on all of these distal screws.  Final fluoroscopic imaging was obtained.  The incision was copiously irrigated.  A  gram of vancomycin powder was placed into the incision.  A layer closure of 0 Vicryl, 2-0 Vicryl and 3-0 Monocryl with Dermabond was used to close the skin.  Sterile dressings were applied.  Patient was then awoken from anesthesia and taken to PACU in stable condition.  Post Op Plan/Instructions: Patient will be weightbearing as tolerated to the right lower extremity.  She will receive postoperative vancomycin.  She will receive Lovenox for DVT prophylaxis.  We will mobilize her with physical and Occupational Therapy.  I was present and performed the entire surgery.  Patrecia Pace, PA-C did assist me throughout the case. An assistant was necessary given the difficulty in approach, maintenance of reduction and ability to instrument the fracture.   Katha Hamming, MD [facility]
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
## 2021-04-29 NOTE — Interval H&P Note (Signed)
History and Physical Interval Note:  04/29/2021 1:58 PM  Kristen Garrison  has presented today for surgery, with the diagnosis of Right distal femur.  The various methods of treatment have been discussed with the patient and family. After consideration of risks, benefits and other options for treatment, the patient has consented to  Procedure(s): OPEN REDUCTION INTERNAL FIXATION (ORIF) DISTAL FEMUR FRACTURE (Right) as a surgical intervention.  The patient's history has been reviewed, patient examined, no change in status, stable for surgery.  I have reviewed the patient's chart and labs.  Questions were answered to the patient's satisfaction.     Caryn Bee P Karmen Altamirano

## 2021-04-29 NOTE — Anesthesia Preprocedure Evaluation (Addendum)
Anesthesia Evaluation  Patient identified by MRN, date of birth, ID band Patient awake    Reviewed: Allergy & Precautions, NPO status , Patient's Chart, lab work & pertinent test results  Airway Mallampati: III  TM Distance: >3 FB Neck ROM: Full    Dental  (+) Dental Advisory Given, Edentulous Upper, Missing   Pulmonary neg pulmonary ROS,    Pulmonary exam normal breath sounds clear to auscultation       Cardiovascular hypertension, Pt. on medications + dysrhythmias Atrial Fibrillation  Rhythm:Regular Rate:Normal     Neuro/Psych PSYCHIATRIC DISORDERS Anxiety  Neuromuscular disease    GI/Hepatic Neg liver ROS, hiatal hernia, GERD  ,  Endo/Other  Obesity   Renal/GU Renal InsufficiencyRenal disease     Musculoskeletal  (+) Arthritis , Right distal femur fracture    Abdominal   Peds  Hematology  (+) Blood dyscrasia, anemia ,   Anesthesia Other Findings Day of surgery medications reviewed with the patient.  Reproductive/Obstetrics                            Anesthesia Physical Anesthesia Plan  ASA: 3  Anesthesia Plan: General   Post-op Pain Management:    Induction: Intravenous  PONV Risk Score and Plan: 3 and Dexamethasone, Ondansetron and Treatment may vary due to age or medical condition  Airway Management Planned: Oral ETT  Additional Equipment:   Intra-op Plan:   Post-operative Plan: Extubation in OR  Informed Consent: I have reviewed the patients History and Physical, chart, labs and discussed the procedure including the risks, benefits and alternatives for the proposed anesthesia with the patient or authorized representative who has indicated his/her understanding and acceptance.     Dental advisory given  Plan Discussed with: CRNA  Anesthesia Plan Comments:         Anesthesia Quick Evaluation

## 2021-04-29 NOTE — Anesthesia Procedure Notes (Signed)
Procedure Name: Intubation Date/Time: 04/29/2021 3:02 PM Performed by: Eligha Bridegroom, CRNA Pre-anesthesia Checklist: Patient identified, Emergency Drugs available, Suction available and Patient being monitored Patient Re-evaluated:Patient Re-evaluated prior to induction Oxygen Delivery Method: Circle system utilized Preoxygenation: Pre-oxygenation with 100% oxygen Induction Type: IV induction Ventilation: Mask ventilation without difficulty and Oral airway inserted - appropriate to patient size Laryngoscope Size: Mac and 3 Grade View: Grade I Tube type: Oral Tube size: 7.0 mm Number of attempts: 1 Placement Confirmation: ETT inserted through vocal cords under direct vision and breath sounds checked- equal and bilateral Secured at: 21 cm Tube secured with: Tape Dental Injury: Teeth and Oropharynx as per pre-operative assessment
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
## 2021-04-30 DIAGNOSIS — S7291XD Unspecified fracture of right femur, subsequent encounter for closed fracture with routine healing: Secondary | ICD-10-CM | POA: Diagnosis not present

## 2021-04-30 LAB — BASIC METABOLIC PANEL
Anion gap: 5 (ref 5–15)
BUN: 16 mg/dL (ref 8–23)
CO2: 28 mmol/L (ref 22–32)
Calcium: 8.4 mg/dL — ABNORMAL LOW (ref 8.9–10.3)
Chloride: 100 mmol/L (ref 98–111)
Creatinine, Ser: 1.18 mg/dL — ABNORMAL HIGH (ref 0.44–1.00)
GFR, Estimated: 43 mL/min — ABNORMAL LOW (ref >60)
Glucose, Bld: 131 mg/dL — ABNORMAL HIGH (ref 70–99)
Potassium: 4.1 mmol/L (ref 3.5–5.1)
Sodium: 133 mmol/L — ABNORMAL LOW (ref 135–145)

## 2021-04-30 LAB — CBC
HCT: 28.5 % — ABNORMAL LOW (ref 36.0–46.0)
Hemoglobin: 9.1 g/dL — ABNORMAL LOW (ref 12.0–15.0)
MCH: 29.4 pg (ref 26.0–34.0)
MCHC: 31.9 g/dL (ref 30.0–36.0)
MCV: 91.9 fL (ref 80.0–100.0)
Platelets: 286 10*3/uL (ref 150–400)
RBC: 3.1 MIL/uL — ABNORMAL LOW (ref 3.87–5.11)
RDW: 13.5 % (ref 11.5–15.5)
WBC: 10.7 10*3/uL — ABNORMAL HIGH (ref 4.0–10.5)
nRBC: 0 % (ref 0.0–0.2)

## 2021-04-30 LAB — VITAMIN D 25 HYDROXY (VIT D DEFICIENCY, FRACTURES): Vit D, 25-Hydroxy: 26.1 ng/mL — ABNORMAL LOW (ref 30–100)

## 2021-04-30 MED ORDER — ACETAMINOPHEN 325 MG PO TABS: 325.0000 mg | ORAL_TABLET | Freq: Four times a day (QID) | ORAL | Status: DC | PRN

## 2021-04-30 MED ORDER — ADULT MULTIVITAMIN W/MINERALS CH
1.0000 | ORAL_TABLET | Freq: Every day | ORAL | Status: DC
  Administered 2021-04-30 – 2021-05-01 (×2): 1 via ORAL
  Filled 2021-04-30 (×2): qty 1

## 2021-04-30 MED ORDER — ENSURE ENLIVE PO LIQD
237.0000 mL | Freq: Three times a day (TID) | ORAL | Status: DC
  Administered 2021-04-30 – 2021-05-01 (×3): 237 mL via ORAL

## 2021-04-30 NOTE — Progress Notes (Signed)
Initial Nutrition Assessment  DOCUMENTATION CODES:  Obesity unspecified  INTERVENTION:  Obtain updated weight.  Add Ensure Plus High Protein po TID, each supplement provides 350 kcal and 20 grams of protein.   Add MVI with minerals daily.  Encourage PO intake.  NUTRITION DIAGNOSIS:  Increased nutrient needs related to post-op healing as evidenced by estimated needs.  GOAL:  Patient will meet greater than or equal to 90% of their needs  MONITOR:  PO intake, Supplement acceptance, Labs, Weight trends, Skin, I & O's  REASON FOR ASSESSMENT:  Consult Hip fracture protocol  ASSESSMENT:  87 yo female with a PMH of chronic A. fib, HTN, anxiety, depression, and GERD from assisted living facility presented after a mechanical fall and right knee pain. Larey Seat out of her mattress. Mostly nonambulatory. Moves around in a wheelchair. Found to have periprosthetic right distal femur fracture. 10/30 -  ORIF  Spoke with pt at bedside along with family. Family reports that pt's appetite is not great, and pt endorses this, along with her not eating too much. Pt reports drinking Ensure at her nursing home.  This is likely due to patient's age, and explained to the family that with age, PO intake does naturally decrease.  Per Epic, pt's weight appears to be copied and pasted from the previous admission in 07/2020. RD to order new measured weight to determine any weight changes.  Of note, pt ambulates in a wheelchair, hence why her muscles are more depleted in her legs.  Recommend Ensure TID and MVI with minerals.  Medications: reviewed; colace BID, LR @ 50 ml/hr, Vicodin PO PRN (given twice today)  Labs: reviewed; Na 133 (L), Glucose 131 (H), Crt 1.18 (H)  NUTRITION - FOCUSED PHYSICAL EXAM: Flowsheet Row Most Recent Value  Orbital Region No depletion  Upper Arm Region No depletion  Thoracic and Lumbar Region No depletion  Buccal Region Mild depletion  Temple Region No depletion  Clavicle  Bone Region No depletion  Clavicle and Acromion Bone Region No depletion  Scapular Bone Region No depletion  Dorsal Hand Mild depletion  Patellar Region Moderate depletion  Anterior Thigh Region Moderate depletion  Posterior Calf Region Moderate depletion  Edema (RD Assessment) None  Hair Reviewed  Eyes Reviewed  Mouth Reviewed  Skin Reviewed  Nails Reviewed   Diet Order:   Diet Order             Diet Heart Room service appropriate? Yes; Fluid consistency: Thin  Diet effective now                  EDUCATION NEEDS:  Education needs have been addressed  Skin:  Skin Assessment: Skin Integrity Issues: Skin Integrity Issues:: Incisions Incisions: R leg, closed  Last BM:  04/29/21 - Type 5, smear  Height:  Ht Readings from Last 1 Encounters:  04/29/21 5\' 4"  (1.626 m)   Weight:  Wt Readings from Last 1 Encounters:  04/29/21 88.5 kg   BMI:  Body mass index is 33.49 kg/m.  Estimated Nutritional Needs:  Kcal:  1800-2000 Protein:  105-120 grams Fluid:  >1.8 L  05/01/21, RD, LDN (she/her/hers) Registered Dietitian I Pager #: [phone] After-Hours/Weekend Pager # in [place]

## 2021-04-30 NOTE — Progress Notes (Signed)
Orthopaedic Trauma Progress Note  SUBJECTIVE: Doing ok this morning, currently eating her breakfast. Notes pain in right leg, asking for pain medication. No other complaints. Has not been out of bed yet  OBJECTIVE:  Vitals:   04/29/21 2054 04/30/21 0723  BP: 123/60 (!) 145/58  Pulse: 79 81  Resp: 18 17  Temp: 98.7 F (37.1 C) 99.8 F (37.7 C)  SpO2: 93% 92%    General: Sitting up in bed, NAD Respiratory: No increased work of breathing.  RLE: Dressing CDI. Able to wiggle toes. Ankle DF/PF intact. Endorses sensation to light touch distally. Compartments soft and compressible. Foot warm and well perfused  IMAGING: Stable post op imaging.   LABS:  Results for orders placed or performed during the hospital encounter of 04/29/21 (from the past 24 hour(s))  CBC     Status: Abnormal   Collection Time: 04/29/21  9:41 AM  Result Value Ref Range   WBC 10.4 4.0 - 10.5 K/uL   RBC 3.67 (L) 3.87 - 5.11 MIL/uL   Hemoglobin 10.9 (L) 12.0 - 15.0 g/dL   HCT 76.7 (L) 20.9 - 47.0 %   MCV 91.8 80.0 - 100.0 fL   MCH 29.7 26.0 - 34.0 pg   MCHC 32.3 30.0 - 36.0 g/dL   RDW 96.2 83.6 - 62.9 %   Platelets 337 150 - 400 K/uL   nRBC 0.0 0.0 - 0.2 %  Creatinine, serum     Status: Abnormal   Collection Time: 04/29/21  9:41 AM  Result Value Ref Range   Creatinine, Ser 1.14 (H) 0.44 - 1.00 mg/dL   GFR, Estimated 45 (L) >60 mL/min  Type and screen [facility]     Status: None (Preliminary result)   Collection Time: 04/29/21  9:45 AM  Result Value Ref Range   ABO/RH(D) O NEG    Antibody Screen POS    Sample Expiration 05/02/2021,2359    Antibody Identification ANTI K    PT AG Type NEGATIVE FOR KELL ANTIGEN    Unit Number [id number]    Blood Component Type RED CELLS,LR    Unit division 00    Status of Unit ALLOCATED    Donor AG Type NEGATIVE FOR KELL ANTIGEN    Transfusion Status OK TO TRANSFUSE    Crossmatch Result COMPATIBLE    Unit Number [id number]    Blood Component  Type RBC LR PHER2    Unit division 00    Status of Unit ALLOCATED    Donor AG Type NEGATIVE FOR KELL ANTIGEN    Transfusion Status OK TO TRANSFUSE    Crossmatch Result COMPATIBLE   Surgical pcr screen     Status: Abnormal   Collection Time: 04/29/21 11:49 AM   Specimen: Nasal Mucosa; Nasal Swab  Result Value Ref Range   MRSA, PCR POSITIVE (A) NEGATIVE   Staphylococcus aureus POSITIVE (A) NEGATIVE  CBC     Status: Abnormal   Collection Time: 04/30/21  2:26 AM  Result Value Ref Range   WBC 10.7 (H) 4.0 - 10.5 K/uL   RBC 3.10 (L) 3.87 - 5.11 MIL/uL   Hemoglobin 9.1 (L) 12.0 - 15.0 g/dL   HCT 94.4 (L) 96.7 - 59.1 %   MCV 91.9 80.0 - 100.0 fL   MCH 29.4 26.0 - 34.0 pg   MCHC 31.9 30.0 - 36.0 g/dL   RDW 63.8 46.6 - 59.9 %   Platelets 286 150 - 400 K/uL   nRBC 0.0 0.0 - 0.2 %  Basic  metabolic panel     Status: Abnormal   Collection Time: 04/30/21  2:26 AM  Result Value Ref Range   Sodium 133 (L) 135 - 145 mmol/L   Potassium 4.1 3.5 - 5.1 mmol/L   Chloride 100 98 - 111 mmol/L   CO2 28 22 - 32 mmol/L   Glucose, Bld 131 (H) 70 - 99 mg/dL   BUN 16 8 - 23 mg/dL   Creatinine, Ser 9.02 (H) 0.44 - 1.00 mg/dL   Calcium 8.4 (L) 8.9 - 10.3 mg/dL   GFR, Estimated 43 (L) >60 mL/min   Anion gap 5 5 - 15    ASSESSMENT: Kristen Garrison is a 87 y.o. female, 1 Day Post-Op s/p ORIF RIGHT DISTAL FEMUR FRACTURE  CV/Blood loss: Acute blood loss anemia, Hgb 9.1 this AM. Hemodynamically stable  PLAN: Weightbearing: WBAT RLE ROM: Ok for knee ROM as tolerated Incisional and dressing care:  Plan to change tomorrow Showering: Ok to begin showering with assistance 05/02/21. Incisions may get wet at that time Orthopedic device(s): None  Pain management:  1. Tylenol 325-650 mg q 6 hours PRN 2. Norco 5-325 mg q 6 hours PRN 3. Morphine 0.5 mg q 2 hours PRN VTE prophylaxis: Lovenox, SCDs ID: Vancomycin post op Foley/Lines:  No foley, KVO IVFs Impediments to Fracture Healing: Vit D level pending, will  start supplementation asd indicated Dispo: PT/OT eval today. Will likely need SNF. Plan to remove dressing tomorrow. TOC following to assist with d/c needs.  Follow - up plan: 2 weeks  Contact information:  Truitt Merle MD, Ulyses Southward PA-C. After hours and holidays please check [email or web address] for group call information for [facility]   Kristen Maiers A. Michaelyn Barter, PA-C [phone] (office) [email or web address]

## 2021-04-30 NOTE — Progress Notes (Signed)
Occupational Therapy Evaluation Patient Details Name: Kristen Garrison MRN: [id number] DOB: [birth year] Today's Date: 04/30/2021   History of Present Illness 87 yo presenting presenting to [facility] ED on 10/30 for fall and R knee pain. X-ray showing distal R femoral fx. Transfer to [facility] and s/p ORIF of R femur on 10/30. PMH including chronic atrial fibrillation not on OAC, hypertension, chronic anxiety/depression, Lumbosacral radicuopathy at S1 (2015), and GERD.   Clinical Impression   PTA, pt was living at [facility] and required assistance with ADLs and using stedy plus for assist with sit<>stand. Pt's caregiver/sitter present and providing information. Also reporting she recently started PT and OT. Currently, pt requiring Max-Total A for ADLs, bed mobility, and functional transfers. Pt performing sit<>stand from EOB with sara stedy and elevated seat; unable to clear hips for stedy seat pads. Use of Maxi-move for transfer to recliner. Pt would benefit from further acute OT to facilitate safe dc. Recommend dc to SNF for further OT to optimize safety, independence with ADLs, and return to PLOF.      Recommendations for follow up therapy are one component of a multi-disciplinary discharge planning process, led by the attending physician.  Recommendations may be updated based on patient status, additional functional criteria and insurance authorization.   Follow Up Recommendations  Skilled nursing-short term rehab (<3 hours/day)    Assistance Recommended at Discharge Frequent or constant Supervision/Assistance  Functional Status Assessment  Patient has had a recent decline in their functional status and/or demonstrates limited ability to make significant improvements in function in a reasonable and predictable amount of time  Equipment Recommendations  None recommended by OT    Recommendations for Other Services PT consult     Precautions / Restrictions Precautions Precautions:  Fall Restrictions Weight Bearing Restrictions: Yes RLE Weight Bearing: Weight bearing as tolerated      Mobility Bed Mobility Overal bed mobility: Needs Assistance Bed Mobility: Supine to Sit;Rolling Rolling: Max assist   Supine to sit: Max assist;+2 for physical assistance     General bed mobility comments: Max A for rolling during peri care and switching of pads. pt able to grab onto bed rails when moving into sidelying position, pt requiring 2 person assist to move from supine > EOB    Transfers Overall transfer level: Needs assistance Equipment used: None Transfers: Sit to/from Stand Sit to Stand: +2 physical assistance;Max assist;Total assist;From elevated surface           General transfer comment: put unable to clear hips when standing using STEDY, pt in too much pain requesting to sit. Pt transfers from EOB > chair using hoyer lift. Transfer via Lift Equipment: [place] Dec Lift;Maximove    Balance Overall balance assessment: History of Falls;Needs assistance Sitting-balance support: No upper extremity supported;Feet supported Sitting balance-Leahy Scale: Poor     Standing balance support: Single extremity supported;During functional activity Standing balance-Leahy Scale: Poor                             ADL either performed or assessed with clinical judgement   ADL Overall ADL's : Needs assistance/impaired;At baseline Eating/Feeding: Minimal assistance;Sitting   Grooming: Sitting;Minimal assistance;Moderate assistance   Upper Body Bathing: Maximal assistance;Total assistance;Bed level   Lower Body Bathing: Maximal assistance;Total assistance;Bed level   Upper Body Dressing : Maximal assistance;Total assistance;Sitting   Lower Body Dressing: Maximal assistance;Total assistance;Bed level   Toilet Transfer: Total assistance;+2 for physical assistance;+2 for safety/equipment   Toileting- Clothing Manipulation  and Hygiene: Total assistance;Maximal  assistance;Bed level Toileting - Clothing Manipulation Details (indicate cue type and reason): able to roll onto L side, as this is the side she normally rolls to per caregiver       General ADL Comments: max-total A for pericare and linen change     Vision Baseline Vision/History: 1 Wears glasses Ability to See in Adequate Light: 0 Adequate Patient Visual Report: No change from baseline       Perception     Praxis      Pertinent Vitals/Pain Pain Assessment: Faces Faces Pain Scale: Hurts little more Breathing: occasional labored breathing, short period of hyperventilation Body Language: tense, distressed pacing, fidgeting Consolability: distracted or reassured by voice/touch Pain Location: RLE Pain Descriptors / Indicators: Discomfort;Constant Pain Intervention(s): Limited activity within patient's tolerance;Monitored during session;Repositioned;Utilized relaxation techniques     Hand Dominance Left   Extremity/Trunk Assessment Upper Extremity Assessment Upper Extremity Assessment: Overall WFL for tasks assessed   Lower Extremity Assessment Lower Extremity Assessment: Defer to PT evaluation   Cervical / Trunk Assessment Cervical / Trunk Assessment: Other exceptions (Increased body habitus. Tendency for leaning to R)   Communication Communication Communication: No difficulties   Cognition Arousal/Alertness: Awake/alert Behavior During Therapy: Anxious Overall Cognitive Status: History of cognitive impairments - at baseline                                 General Comments: pt requriing increased time and increased v/cing to complete task. RN reporting patient has cognitive deficits at baseline. Oriented to self     General Comments  pt picking at bandages, RN notified    Exercises     Shoulder Instructions      Home Living Family/patient expects to be discharged to:: Skilled nursing facility                                  Additional Comments: [facility] independent [facility] facility      Prior Functioning/Environment Prior Level of Function : Needs assist             Mobility Comments: Use a lift for OOB. started PTand OT about 2 weeks ADLs Comments: Aide assists with all BADLs. Supervision for eating. has glasses and dentures        OT Problem List: Decreased strength;Decreased range of motion;Decreased coordination;Decreased activity tolerance;Pain;Impaired balance (sitting and/or standing)      OT Treatment/Interventions: Self-care/ADL training;Therapeutic activities;Patient/family education;Therapeutic exercise    OT Goals(Current goals can be found in the care plan section) Acute Rehab OT Goals Patient Stated Goal: return to facility OT Goal Formulation: With patient Time For Goal Achievement: 05/14/21 Potential to Achieve Goals: Fair ADL Goals Pt Will Perform Eating: sitting;with min assist Pt Will Perform Grooming: sitting;with min assist Pt Will Perform Upper Body Dressing: with mod assist;sitting  OT Frequency: Min 1X/week   Barriers to D/C:            Co-evaluation              AM-PAC OT "6 Clicks" Daily Activity     Outcome Measure Help from another person eating meals?: A Little Help from another person taking care of personal grooming?: A Little Help from another person toileting, which includes using toliet, bedpan, or urinal?: Total Help from another person bathing (including washing, rinsing, drying)?: Total Help from another person to  put on and taking off regular upper body clothing?: A Lot Help from another person to put on and taking off regular lower body clothing?: Total 6 Click Score: 11   End of Session Equipment Utilized During Treatment: Gait belt;Other (comment) (STEDY and hoyer lift) Nurse Communication: Need for lift equipment  Activity Tolerance: Patient limited by pain;Patient limited by fatigue Patient left: in chair;with chair alarm set;with  family/visitor present  OT Visit Diagnosis: Unsteadiness on feet (R26.81);Other abnormalities of gait and mobility (R26.89);Repeated falls (R29.6);Muscle weakness (generalized) (M62.81);Pain Pain - Right/Left: Right Pain - part of body: Leg;Hip                Time: [id number] OT Time Calculation (min): 53 min Charges:  OT General Charges $OT Visit: 1 Visit OT Evaluation $OT Eval Moderate Complexity: 1 Mod OT Treatments $Self Care/Home Management : 38-52 mins  Yamin Swingler MSOT, OTR/L Acute Rehab Pager: [phone] Office: [place] 04/30/2021, 1:30 PM

## 2021-04-30 NOTE — TOC Progression Note (Signed)
Transition of Care [facility]) - Initial/Assessment Note    Patient Details  Name: Kristen Garrison MRN: [id number] Date of Birth: 01/09/1927  Transition of Care [facility]) CM/SW Contact:    Ralene Bathe, LCSWA Phone Number: 04/30/2021, 12:12 PM  Clinical Narrative:                 CSW called [facility], the SNF where the patient was admitted from, to inquire about whether the patient is long term care.  There was no answer and CSW is awaiting a returned call.          Patient Goals and CMS Choice        Expected Discharge Plan and Services                                                Prior Living Arrangements/Services                       Activities of Daily Living      Permission Sought/Granted                  Emotional Assessment              Admission diagnosis:  Right femoral fracture (HCC) [S72.91XA] Fall, initial encounter [W19.XXXA] Closed fracture of distal end of femur, unspecified fracture morphology, initial encounter (HCC) [S72.409A] Patient Active Problem List   Diagnosis Date Noted   Right femoral fracture (HCC) 04/29/2021   Fall 10/19/2017   Orthostatic hypotension 10/19/2017   Normocytic anemia 10/19/2017   CKD (chronic kidney disease), stage III (HCC) 10/19/2017   Acute blood loss anemia 10/19/2017   Anxiety 10/19/2017   Acute UTI    Symptomatic anemia    Heme positive stool    Melena    Hiatal hernia    Esophageal ring    Anemia 09/07/2017   S/P total knee arthroplasty    Arterial hypotension    Elevated troponin    Failed total right knee replacement (HCC) 01/12/2016   Back pain 07/28/2014   Weakness of both lower extremities 07/28/2014   Lumbosacral radiculopathy at S1 05/25/2014   Cardiac device in situ 02/22/2014   Adequate anticoagulation on anticoagulant therapy 09/01/2013   Atrial fibrillation (HCC) 06/04/2013   HTN (hypertension) 06/04/2013   UTI (urinary tract infection) 06/04/2013    Contusion of right elbow and forearm 06/04/2013   Syncope 06/03/2013   Esophageal reflux 04/01/2008   DIVERTICULOSIS OF COLON 04/01/2008   DYSPHAGIA UNSPECIFIED 04/01/2008   PCP:  Eloisa Northern, MD Pharmacy:   [facility] - [place], [place] - [address] [place] [address] Phone: [phone] Fax: [phone]     Social Determinants of Health (SDOH) Interventions    Readmission Risk Interventions No flowsheet data found.

## 2021-04-30 NOTE — Progress Notes (Signed)
PROGRESS NOTE    Viola A Brunton  TGG:[id number] DOB: Jun 03, 1927 DOA: 04/29/2021 PCP: Eloisa Northern, MD    Brief Narrative:  87 year old female with history of chronic A. fib not on anticoagulation, hypertension, anxiety depression, GERD from assisted living facility presented after a mechanical fall and right knee pain.  Larey Seat out of her mattress.  Mostly nonambulatory.  Moves around in a wheelchair.  She was found to have periprosthetic right distal femur fracture.  Underwent ORIF on 10/30.   Assessment & Plan:   Active Problems:   Right femoral fracture (HCC)  Close traumatic periprosthetic fracture right femur: Status post ORIF 10/30 Dr. Jena Gauss Weightbearing as tolerated.  Start with PT OT today. DVT prophylaxis with Lovenox. Adequate pain management with Tylenol, Norco and IV morphine as needed. Anticipate discharge back to skilled nursing rehab side of [facility] assisted living.  Chronic A. fib, not on anticoagulation: Essential hypertension Chronic anxiety and depression COPD  Stable chronic medical problems.  Continued home medications.   DVT prophylaxis: enoxaparin (LOVENOX) injection 40 mg Start: 04/30/21 0800 SCDs Start: 04/29/21 1852   Code Status: Full code Family Communication: Her friend Renee at the bedside Disposition Plan: Status is: Inpatient  Remains inpatient appropriate because: Immediate postop.  Pain management.  Mobility.       Consultants:  Orthopedics  Procedures:  ORIF 10/30  Antimicrobials:  None   Subjective: Patient seen and examined.  Denies any complaints at rest.  She was trying to manipulate her dressing as she had some itching.  No other overnight events.  It hurts but manageable at rest.  She has not mobilized yet.  Her friend was at the bedside who she introduces as her caregiver.    Objective: Vitals:   04/29/21 1827 04/29/21 2054 04/30/21 0723 04/30/21 1121  BP: 124/83 123/60 (!) 145/58 (!) 143/61  Pulse: 79 79 81    Resp: 14 18 17    Temp: 98.7 F (37.1 C) 98.7 F (37.1 C) 99.8 F (37.7 C)   TempSrc: Oral Oral Oral   SpO2: 92% 93% 92%   Weight:      Height:        Intake/Output Summary (Last 24 hours) at 04/30/2021 1301 Last data filed at 04/30/2021 05/02/2021 Gross per 24 hour  Intake 995.89 ml  Output 20 ml  Net 975.89 ml   Filed Weights   04/29/21 0147  Weight: 88.5 kg    Examination:  General exam: Appears calm and comfortable  On room air. Pleasant , alert and oriented. Respiratory system: Clear to auscultation. Respiratory effort normal. Cardiovascular system: S1 & S2 heard, RRR. 05/01/21 Gastrointestinal system: soft, NT, BS prsent. Central nervous system: Alert and oriented. No focal neurological deficits. Extremities: Symmetric 5 x 5 power. Right lateral thigh incision clean and dry. Distal neurovascular status intact.    Data Reviewed: I have personally reviewed following labs and imaging studies  CBC: Recent Labs  Lab 04/29/21 0032 04/29/21 0530 04/29/21 0941 04/30/21 0226  WBC 10.9* 13.6* 10.4 10.7*  NEUTROABS 5.0  --   --   --   HGB 11.1* 11.5* 10.9* 9.1*  HCT 34.7* 36.1 33.7* 28.5*  MCV 92.3 93.0 91.8 91.9  PLT 318 358 337 286   Basic Metabolic Panel: Recent Labs  Lab 04/29/21 0032 04/29/21 0530 04/29/21 0941 04/30/21 0226  NA 135 135  --  133*  K 3.8 4.1  --  4.1  CL 98 97*  --  100  CO2 26 28  --  28  GLUCOSE 148* 116*  --  131*  BUN 25* 24*  --  16  CREATININE 1.00 1.03* 1.14* 1.18*  CALCIUM 8.9 8.7*  --  8.4*  MG  --  2.3  --   --   PHOS  --  4.0  --   --    GFR: Estimated Creatinine Clearance: 32.8 mL/min (A) (by C-G formula based on SCr of 1.18 mg/dL (H)). Liver Function Tests: No results for input(s): AST, ALT, ALKPHOS, BILITOT, PROT, ALBUMIN in the last 168 hours. No results for input(s): LIPASE, AMYLASE in the last 168 hours. No results for input(s): AMMONIA in the last 168 hours. Coagulation Profile: Recent Labs  Lab 04/29/21 0530  INR  0.9   Cardiac Enzymes: No results for input(s): CKTOTAL, CKMB, CKMBINDEX, TROPONINI in the last 168 hours. BNP (last 3 results) No results for input(s): PROBNP in the last 8760 hours. HbA1C: No results for input(s): HGBA1C in the last 72 hours. CBG: No results for input(s): GLUCAP in the last 168 hours. Lipid Profile: No results for input(s): CHOL, HDL, LDLCALC, TRIG, CHOLHDL, LDLDIRECT in the last 72 hours. Thyroid Function Tests: No results for input(s): TSH, T4TOTAL, FREET4, T3FREE, THYROIDAB in the last 72 hours. Anemia Panel: No results for input(s): VITAMINB12, FOLATE, FERRITIN, TIBC, IRON, RETICCTPCT in the last 72 hours. Sepsis Labs: No results for input(s): PROCALCITON, LATICACIDVEN in the last 168 hours.  Recent Results (from the past 240 hour(s))  Resp Panel by RT-PCR (Flu A&B, Covid) Nasopharyngeal Swab     Status: None   Collection Time: 04/29/21  1:23 AM   Specimen: Nasopharyngeal Swab; Nasopharyngeal(NP) swabs in vial transport medium  Result Value Ref Range Status   SARS Coronavirus 2 by RT PCR NEGATIVE NEGATIVE Final    Comment: (NOTE) SARS-CoV-2 target nucleic acids are NOT DETECTED.  The SARS-CoV-2 RNA is generally detectable in upper respiratory specimens during the acute phase of infection. The lowest concentration of SARS-CoV-2 viral copies this assay can detect is 138 copies/mL. A negative result does not preclude SARS-Cov-2 infection and should not be used as the sole basis for treatment or other patient management decisions. A negative result may occur with  improper specimen collection/handling, submission of specimen other than nasopharyngeal swab, presence of viral mutation(s) within the areas targeted by this assay, and inadequate number of viral copies(<138 copies/mL). A negative result must be combined with clinical observations, patient history, and epidemiological information. The expected result is Negative.  Fact Sheet for Patients:   EntrepreneurPulse.com.au  Fact Sheet for Healthcare Providers:  IncredibleEmployment.be  This test is no t yet approved or cleared by the [place] FDA and  has been authorized for detection and/or diagnosis of SARS-CoV-2 by FDA under an Emergency Use Authorization (EUA). This EUA will remain  in effect (meaning this test can be used) for the duration of the COVID-19 declaration under Section 564(b)(1) of the Act, 21 U.S.C.section 360bbb-3(b)(1), unless the authorization is terminated  or revoked sooner.       Influenza A by PCR NEGATIVE NEGATIVE Final   Influenza B by PCR NEGATIVE NEGATIVE Final    Comment: (NOTE) The Xpert Xpress SARS-CoV-2/FLU/RSV plus assay is intended as an aid in the diagnosis of influenza from Nasopharyngeal swab specimens and should not be used as a sole basis for treatment. Nasal washings and aspirates are unacceptable for Xpert Xpress SARS-CoV-2/FLU/RSV testing.  Fact Sheet for Patients: EntrepreneurPulse.com.au  Fact Sheet for Healthcare Providers: IncredibleEmployment.be  This test is not yet approved or cleared  by the [place] and has been authorized for detection and/or diagnosis of SARS-CoV-2 by FDA under an Emergency Use Authorization (EUA). This EUA will remain in effect (meaning this test can be used) for the duration of the COVID-19 declaration under Section 564(b)(1) of the Act, 21 U.S.C. section 360bbb-3(b)(1), unless the authorization is terminated or revoked.  Performed at [facility], [place] [address], [place],  [address]   Surgical pcr screen     Status: Abnormal   Collection Time: 04/29/21 11:49 AM   Specimen: Nasal Mucosa; Nasal Swab  Result Value Ref Range Status   MRSA, PCR POSITIVE (A) NEGATIVE Final    Comment: RESULT CALLED TO, READ BACK BY AND VERIFIED WITH: R,THOMAS @1601  04/29/21 EB    Staphylococcus aureus  POSITIVE (A) NEGATIVE Final    Comment: (NOTE) The Xpert SA Assay (FDA approved for NASAL specimens in patients 21 years of age and older), is one component of a comprehensive surveillance program. It is not intended to diagnose infection nor to guide or monitor treatment. Performed at [facility], [place] [address], [place],  [address]          Radiology Studies: DG Ankle Complete Right  Result Date: 04/29/2021 CLINICAL DATA:  Fall EXAM: RIGHT ANKLE - COMPLETE 3+ VIEW COMPARISON:  None. FINDINGS: No evidence of acute fracture or dislocation. Deformity related to old distal tibial fracture with mild tibiotalar degenerative changes. Deformity related to old fibular shaft fracture. The visualized soft tissues are unremarkable. IMPRESSION: No evidence of acute fracture or dislocation. Deformity related to old distal tibia and fibula fractures, as above. Electronically Signed   By: Julian Hy M.D.   On: 04/29/2021 01:26   DG Knee Complete 4 Views Right  Result Date: 04/29/2021 CLINICAL DATA:  Fall EXAM: RIGHT KNEE - COMPLETE 4+ VIEW COMPARISON:  None. FINDINGS: Comminuted distal femoral shaft fracture with 1/2 shaft width lateral and posterior displacement. Right knee arthroplasty. No suprapatellar knee joint effusion. IMPRESSION: Comminuted, displaced distal femoral shaft fracture, as above. Electronically Signed   By: Julian Hy M.D.   On: 04/29/2021 01:25   DG C-Arm 1-60 Min-No Report  Result Date: 04/29/2021 Fluoroscopy was utilized by the requesting physician.  No radiographic interpretation.   DG Hip Unilat W or Wo Pelvis 2-3 Views Right  Result Date: 04/29/2021 CLINICAL DATA:  Fall EXAM: DG HIP (WITH OR WITHOUT PELVIS) 2-3V RIGHT COMPARISON:  None. FINDINGS: Status post ORIF of the left hip, without evidence of complication. Moderate degenerative changes of the left hip. Mild degenerative changes of the right hip. Degenerative changes of the lower lumbar  spine. No evidence of acute fracture or dislocation. Right hip and visualized bony pelvis are intact. IMPRESSION: No evidence of acute fracture or dislocation. Status post ORIF of the left hip, without evidence of complication. Degenerative changes of the lumbar spine and bilateral hips. Electronically Signed   By: Julian Hy M.D.   On: 04/29/2021 01:23   DG FEMUR, MIN 2 VIEWS RIGHT  Result Date: 04/29/2021 CLINICAL DATA:  Open reduction internal fixation of right femur fracture. EXAM: RIGHT FEMUR 2 VIEWS COMPARISON:  Preoperative radiograph earlier today. FINDINGS: Six fluoroscopic spot views obtained in the operating room of the right femur. Lateral plate and multi screw fixation of distal femur fracture. Knee arthroplasty is partially included. There are cerclage wires in the femoral shaft. Fluoroscopy time 49 seconds. Dose 5.29 mGy. IMPRESSION: Procedural fluoroscopy for  ORIF of distal femur fracture. Electronically Signed  By: Keith Rake M.D.   On: 04/29/2021 16:33   DG FEMUR PORT, MIN 2 VIEWS RIGHT  Result Date: 04/29/2021 CLINICAL DATA:  Postop. EXAM: RIGHT FEMUR PORTABLE 2 VIEW COMPARISON:  Preoperative knee and hip radiographs earlier today. FINDINGS: Lateral plate and multi screw fixation of distal femur fracture. Fracture is in improved alignment from preoperative imaging. Right knee arthroplasty is intact. Probable remote right femoral shaft fracture with cortical thickening and cerclage wires. Recent postsurgical change includes air and edema of the soft tissues. IMPRESSION: ORIF distal femur fracture, in improved alignment from preoperative imaging. Electronically Signed   By: Keith Rake M.D.   On: 04/29/2021 18:20        Scheduled Meds:  amLODipine  5 mg Oral Daily   Chlorhexidine Gluconate Cloth  6 each Topical Q0600   docusate sodium  100 mg Oral BID   enoxaparin (LOVENOX) injection  40 mg Subcutaneous Q24H   mupirocin ointment  1 application Nasal BID    Continuous Infusions:  lactated ringers Stopped (04/29/21 1900)     LOS: 1 day    Time spent: 35 minutes     Barb Merino, MD [facility] Pager [phone]

## 2021-04-30 NOTE — Evaluation (Signed)
Physical Therapy Evaluation Patient Details Name: Kristen Garrison MRN: [id number] DOB: 01/15/1927 Today's Date: 04/30/2021  History of Present Illness  87 yo presenting presenting to [facility] ED on 10/30 for fall and R knee pain. X-ray showing distal R femoral fx. Transfer to [facility] and s/p ORIF of R femur on 10/30. PMH including chronic atrial fibrillation not on [place], hypertension, chronic anxiety/depression, Lumbosacral radicuopathy at S1 (2015), and GERD.  Clinical Impression  Patient presents with pain and post surgical deficits s/p above surgery. Pt is from [facility] and requires assist for ADLs at baseline. Per caregiver, present in room, pt has not been ambulatory for ~1 year and recently started working with PT/OT using sara plus for transfers and to work on standing. Today, pain limiting mobility. Used maximove to get back to bed during session. Limited ability to mobilize RLE. Recommend return to SNF to maximize independence and mobility and ease burden of care at d/c. Will follow acutely.     Recommendations for follow up therapy are one component of a multi-disciplinary discharge planning process, led by the attending physician.  Recommendations may be updated based on patient status, additional functional criteria and insurance authorization.  Follow Up Recommendations Skilled nursing-short term rehab (<3 hours/day)    Assistance Recommended at Discharge Frequent or constant Supervision/Assistance  Functional Status Assessment Patient has had a recent decline in their functional status and/or demonstrates limited ability to make significant improvements in function in a reasonable and predictable amount of time  Equipment Recommendations  None recommended by PT    Recommendations for Other Services       Precautions / Restrictions Precautions Precautions: Fall Restrictions Weight Bearing Restrictions: Yes RLE Weight Bearing: Weight bearing as tolerated      Mobility  Bed  Mobility Overal bed mobility: Needs Assistance Bed Mobility: Sit to Supine Rolling: Max assist   Supine to sit: Max assist;+2 for physical assistance Sit to supine: Max assist;+2 for physical assistance;HOB elevated   General bed mobility comments: Assist to bring LEs into bed and to lower trunk down. max A to roll in both directions to fix pads    Transfers Overall transfer level: Needs assistance Equipment used: Ambulation equipment used Transfers: Sit to/from Stand Sit to Stand: +2 physical assistance;Max assist;Total assist;From elevated surface           General transfer comment: Used maximove to transfer pt back to bed due to increased pain. Too much pain to try using stedy this afternoon. Transfer via Lift Equipment: Maximove  Ambulation/Gait                Stairs            Wheelchair Mobility    Modified Rankin (Stroke Patients Only)       Balance Overall balance assessment: History of Falls;Needs assistance Sitting-balance support: No upper extremity supported;Feet supported Sitting balance-Leahy Scale: Poor     Standing balance support: Single extremity supported;During functional activity Standing balance-Leahy Scale: Poor                               Pertinent Vitals/Pain Pain Assessment: Faces Faces Pain Scale: Hurts worst Breathing: occasional labored breathing, short period of hyperventilation Body Language: tense, distressed pacing, fidgeting Consolability: distracted or reassured by voice/touch Pain Location: RLE with movement/dependent position Pain Descriptors / Indicators: Discomfort;Constant;Grimacing;Guarding;Operative site guarding Pain Intervention(s): Monitored during session;Repositioned;Premedicated before session;Limited activity within patient's tolerance    Home Living  Family/patient expects to be discharged to:: Skilled nursing facility                   Additional Comments: [facility]  independent living facility    Prior Function Prior Level of Function : Needs assist  Cognitive Assist : Mobility (cognitive);ADLs (cognitive)     Physical Assist : ADLs (physical);Mobility (physical) Mobility (physical): Bed mobility;Transfers ADLs (physical): Dressing;Bathing;Toileting Mobility Comments: Uses sara lift for OOB with therapy. Has not walked in over 1 year ADLs Comments: Aide assists with all BADLs. Supervision for eating. has glasses and dentures     Hand Dominance   Dominant Hand: Left    Extremity/Trunk Assessment   Upper Extremity Assessment Upper Extremity Assessment: Defer to OT evaluation    Lower Extremity Assessment Lower Extremity Assessment: RLE deficits/detail RLE Deficits / Details: post op deficits- ankle AROM WFLs, limited knee flexion due to pain. RLE: Unable to fully assess due to pain RLE Sensation: decreased light touch RLE Coordination: decreased fine motor;decreased gross motor    Cervical / Trunk Assessment Cervical / Trunk Assessment: Other exceptions (Increased body habitus. Tendency for leaning to R)  Communication   Communication: No difficulties  Cognition Arousal/Alertness: Awake/alert Behavior During Therapy: Anxious Overall Cognitive Status: History of cognitive impairments - at baseline                                 General Comments: pt requriing increased time and increased v/cing to complete task. RN reporting patient has cognitive deficits at baseline. Oriented to self only        General Comments General comments (skin integrity, edema, etc.): Pt's aide present during session.    Exercises General Exercises - Lower Extremity Ankle Circles/Pumps: AROM;Both;10 reps;Supine Long Arc Quad: AROM;Right;10 reps;[facility]   Assessment/Plan    PT Assessment Patient needs continued PT services  PT Problem List Pain;Decreased balance;Decreased strength;Decreased range of motion;Decreased  cognition;Decreased activity tolerance;Decreased skin integrity;Impaired sensation;Decreased knowledge of precautions;Decreased mobility       PT Treatment Interventions Therapeutic exercise;Patient/family education;Therapeutic activities;Functional mobility training;Balance training;DME instruction;Wheelchair mobility training    PT Goals (Current goals can be found in the Care Plan section)  Acute Rehab PT Goals Patient Stated Goal: to get back to bed PT Goal Formulation: Patient unable to participate in goal setting Time For Goal Achievement: 05/14/21 Potential to Achieve Goals: Fair    Frequency Min 3X/week   Barriers to discharge        Co-evaluation               AM-PAC PT "6 Clicks" Mobility  Outcome Measure Help needed turning from your back to your side while in a flat bed without using bedrails?: Total Help needed moving from lying on your back to sitting on the side of a flat bed without using bedrails?: Total Help needed moving to and from a bed to a chair (including a wheelchair)?: Total Help needed standing up from a chair using your arms (e.g., wheelchair or bedside chair)?: Total Help needed to walk in hospital room?: Total Help needed climbing 3-5 steps with a railing? : Total 6 Click Score: 6    End of Session   Activity Tolerance: Patient limited by pain Patient left: in bed;with call bell/phone within reach;with bed alarm set;with family/visitor present Nurse Communication: Mobility status;Need for lift equipment PT Visit Diagnosis: Pain;Muscle weakness (generalized) (M62.81) Pain - Right/Left: Right Pain - part of body: Leg  Time: 7282-0601 PT Time Calculation (min) (ACUTE ONLY): 22 min   Charges:   PT Evaluation $PT Eval Moderate Complexity: 1 Mod          Vale Haven, PT, DPT Acute Rehabilitation Services Pager [phone] Office [phone]     Blake Divine A Lanier Ensign 04/30/2021, 3:44 PM

## 2021-05-01 ENCOUNTER — Encounter (HOSPITAL_COMMUNITY): Payer: Self-pay | Admitting: Student

## 2021-05-01 DIAGNOSIS — S7291XD Unspecified fracture of right femur, subsequent encounter for closed fracture with routine healing: Secondary | ICD-10-CM | POA: Diagnosis not present

## 2021-05-01 LAB — CBC
HCT: 27.7 % — ABNORMAL LOW (ref 36.0–46.0)
Hemoglobin: 9 g/dL — ABNORMAL LOW (ref 12.0–15.0)
MCH: 29.8 pg (ref 26.0–34.0)
MCHC: 32.5 g/dL (ref 30.0–36.0)
MCV: 91.7 fL (ref 80.0–100.0)
Platelets: 269 10*3/uL (ref 150–400)
RBC: 3.02 MIL/uL — ABNORMAL LOW (ref 3.87–5.11)
RDW: 13.4 % (ref 11.5–15.5)
WBC: 11.2 10*3/uL — ABNORMAL HIGH (ref 4.0–10.5)
nRBC: 0 % (ref 0.0–0.2)

## 2021-05-01 LAB — BASIC METABOLIC PANEL
Anion gap: 7 (ref 5–15)
BUN: 15 mg/dL (ref 8–23)
CO2: 27 mmol/L (ref 22–32)
Calcium: 8.2 mg/dL — ABNORMAL LOW (ref 8.9–10.3)
Chloride: 100 mmol/L (ref 98–111)
Creatinine, Ser: 1.1 mg/dL — ABNORMAL HIGH (ref 0.44–1.00)
GFR, Estimated: 47 mL/min — ABNORMAL LOW (ref >60)
Glucose, Bld: 130 mg/dL — ABNORMAL HIGH (ref 70–99)
Potassium: 3.7 mmol/L (ref 3.5–5.1)
Sodium: 134 mmol/L — ABNORMAL LOW (ref 135–145)

## 2021-05-01 MED ORDER — VITAMIN D3 25 MCG PO TABS: 1000.0000 [IU] | ORAL_TABLET | Freq: Every day | ORAL | 2 refills | Status: AC

## 2021-05-01 MED ORDER — VITAMIN D 25 MCG (1000 UNIT) PO TABS
1000.0000 [IU] | ORAL_TABLET | Freq: Every day | ORAL | Status: DC
  Administered 2021-05-01: 1000 [IU] via ORAL
  Filled 2021-05-01: qty 1

## 2021-05-01 MED ORDER — HYDROCODONE-ACETAMINOPHEN 5-325 MG PO TABS: 1.0000 | ORAL_TABLET | Freq: Four times a day (QID) | ORAL | 0 refills | Status: AC | PRN

## 2021-05-01 MED ORDER — ENOXAPARIN SODIUM 40 MG/0.4ML IJ SOSY: 40.0000 mg | PREFILLED_SYRINGE | INTRAMUSCULAR | 0 refills | Status: AC

## 2021-05-01 NOTE — TOC Transition Note (Signed)
Transition of Care [facility]) - CM/SW Discharge Note   Patient Details  Name: Kristen Garrison MRN: [id number] Date of Birth: [birth year]  Transition of Care [facility]) CM/SW Contact:  Ralene Bathe, LCSWA Phone Number: 05/01/2021, 11:36 AM   Clinical Narrative:     Patient will DC to: [facility] Anticipated DC date: 05/01/2021 Family notified: Yes Transport by: Sharin Mons   Per MD patient ready for DC to SNF. RN to call report prior to discharge [phone]. RN, patient, patient's family, and facility notified of DC. Discharge Summary and FL2 sent to facility. DC packet on chart. Ambulance transport requested for patient.   CSW will sign off for now as social work intervention is no longer needed. Please consult [place] again if new needs arise.          Patient Goals and CMS Choice        Discharge Placement                       Discharge Plan and Services                                     Social Determinants of Health (SDOH) Interventions     Readmission Risk Interventions No flowsheet data found.

## 2021-05-01 NOTE — Plan of Care (Signed)
?  Problem: Clinical Measurements: ?Goal: Will remain free from infection ?Outcome: Progressing ?  ?

## 2021-05-01 NOTE — Progress Notes (Signed)
Report called to facility

## 2021-05-01 NOTE — Discharge Instructions (Signed)
Orthopaedic Trauma Service Discharge Instructions   General Discharge Instructions  WEIGHT BEARING STATUS:weightbearing as tolerated  RANGE OF MOTION/ACTIVITY:Ok for knee motion as tolerated  Wound Care: Incisions can be left open to air if there is no drainage. If incision continues to have drainage, follow wound care instructions below. Okay to shower if no drainage from incisions.  DVT/PE prophylaxis: Lovenox x 30 days  Diet: as you were eating previously.  Can use over the counter stool softeners and bowel preparations, such as Miralax, to help with bowel movements.  Narcotics can be constipating.  Be sure to drink plenty of fluids  PAIN MEDICATION USE AND EXPECTATIONS  You have likely been given narcotic medications to help control your pain.  After a traumatic event that results in an fracture (broken bone) with or without surgery, it is ok to use narcotic pain medications to help control one's pain.  We understand that everyone responds to pain differently and each individual patient will be evaluated on a regular basis for the continued need for narcotic medications. Ideally, narcotic medication use should last no more than 6-8 weeks (coinciding with fracture healing).   As a patient it is your responsibility as well to monitor narcotic medication use and report the amount and frequency you use these medications when you come to your office visit.   We would also advise that if you are using narcotic medications, you should take a dose prior to therapy to maximize you participation.  IF YOU ARE ON NARCOTIC MEDICATIONS IT IS NOT PERMISSIBLE TO OPERATE A MOTOR VEHICLE (MOTORCYCLE/CAR/TRUCK/MOPED) OR HEAVY MACHINERY DO NOT MIX NARCOTICS WITH OTHER CNS (CENTRAL NERVOUS SYSTEM) DEPRESSANTS SUCH AS ALCOHOL   STOP SMOKING OR USING NICOTINE PRODUCTS!!!!  As discussed nicotine severely impairs your body's ability to heal surgical and traumatic wounds but also impairs bone healing.  Wounds  and bone heal by forming microscopic blood vessels (angiogenesis) and nicotine is a vasoconstrictor (essentially, shrinks blood vessels).  Therefore, if vasoconstriction occurs to these microscopic blood vessels they essentially disappear and are unable to deliver necessary nutrients to the healing tissue.  This is one modifiable factor that you can do to dramatically increase your chances of healing your injury.    (This means no smoking, no nicotine gum, patches, etc)  DO NOT USE NONSTEROIDAL ANTI-INFLAMMATORY DRUGS (NSAID'S)  Using products such as Advil (ibuprofen), Aleve (naproxen), Motrin (ibuprofen) for additional pain control during fracture healing can delay and/or prevent the healing response.  If you would like to take over the counter (OTC) medication, Tylenol (acetaminophen) is ok.  However, some narcotic medications that are given for pain control contain acetaminophen as well. Therefore, you should not exceed more than 4000 mg of tylenol in a day if you do not have liver disease.  Also note that there are may OTC medicines, such as cold medicines and allergy medicines that my contain tylenol as well.  If you have any questions about medications and/or interactions please ask your doctor/PA or your pharmacist.      ICE AND ELEVATE INJURED/OPERATIVE EXTREMITY  Using ice and elevating the injured extremity above your heart can help with swelling and pain control.  Icing in a pulsatile fashion, such as 20 minutes on and 20 minutes off, can be followed.    Do not place ice directly on skin. Make sure there is a barrier between to skin and the ice pack.    Using frozen items such as frozen peas works well as the conform  nicely to the are that needs to be iced.  USE AN ACE WRAP OR TED HOSE FOR SWELLING CONTROL  In addition to icing and elevation, Ace wraps or TED hose are used to help limit and resolve swelling.  It is recommended to use Ace wraps or TED hose until you are informed to stop.     When using Ace Wraps start the wrapping distally (farthest away from the body) and wrap proximally (closer to the body)   Example: If you had surgery on your leg or thing and you do not have a splint on, start the ace wrap at the toes and work your way up to the thigh        If you had surgery on your upper extremity and do not have a splint on, start the ace wrap at your fingers and work your way up to the upper arm   CALL THE OFFICE WITH ANY QUESTIONS OR CONCERNS: [phone]   VISIT OUR WEBSITE FOR ADDITIONAL INFORMATION: [email or web address]    Discharge Wound Care Instructions  Do NOT apply any ointments, solutions or lotions to pin sites or surgical wounds.  These prevent needed drainage and even though solutions like hydrogen peroxide kill bacteria, they also damage cells lining the pin sites that help fight infection.  Applying lotions or ointments can keep the wounds moist and can cause them to breakdown and open up as well. This can increase the risk for infection. When in doubt call the office.  If any drainage is noted, use one layer of adaptic, then gauze, Kerlix, and an ace wrap.  Once the incision is completely dry and without drainage, it may be left open to air out.  Showering may begin 36-48 hours later.  Cleaning gently with soap and water.

## 2021-05-01 NOTE — Progress Notes (Signed)
Orthopaedic Trauma Progress Note  SUBJECTIVE: Doing ok this morning, mild pain in right leg. No other complaints. Does not remember working with therapies yesterday.  OBJECTIVE:  Vitals:   04/30/21 2016 05/01/21 0746  BP: 127/89 138/65  Pulse: 87 69  Resp: 18 17  Temp: 98.7 F (37.1 C) 98.6 F (37 C)  SpO2: 94% 94%    General: Sitting up in bed, NAD Respiratory: No increased work of breathing.  RLE: Dressing changed, incisions CDI. Able to wiggle toes. Ankle DF/PF intact. Endorses sensation to light touch distally. Compartments soft and compressible. Foot warm and well perfused  IMAGING: Stable post op imaging.   LABS:  Results for orders placed or performed during the hospital encounter of 04/29/21 (from the past 24 hour(s))  Basic metabolic panel     Status: Abnormal   Collection Time: 05/01/21  2:37 AM  Result Value Ref Range   Sodium 134 (L) 135 - 145 mmol/L   Potassium 3.7 3.5 - 5.1 mmol/L   Chloride 100 98 - 111 mmol/L   CO2 27 22 - 32 mmol/L   Glucose, Bld 130 (H) 70 - 99 mg/dL   BUN 15 8 - 23 mg/dL   Creatinine, Ser 7.61 (H) 0.44 - 1.00 mg/dL   Calcium 8.2 (L) 8.9 - 10.3 mg/dL   GFR, Estimated 47 (L) >60 mL/min   Anion gap 7 5 - 15  CBC     Status: Abnormal   Collection Time: 05/01/21  2:37 AM  Result Value Ref Range   WBC 11.2 (H) 4.0 - 10.5 K/uL   RBC 3.02 (L) 3.87 - 5.11 MIL/uL   Hemoglobin 9.0 (L) 12.0 - 15.0 g/dL   HCT 60.7 (L) 37.1 - 06.2 %   MCV 91.7 80.0 - 100.0 fL   MCH 29.8 26.0 - 34.0 pg   MCHC 32.5 30.0 - 36.0 g/dL   RDW 69.4 85.4 - 62.7 %   Platelets 269 150 - 400 K/uL   nRBC 0.0 0.0 - 0.2 %    ASSESSMENT: Kristen Garrison is a 87 y.o. female, 2 Days Post-Op s/p ORIF RIGHT DISTAL FEMUR FRACTURE  CV/Blood loss: Acute blood loss anemia, Hgb 9.0 this AM. Hemodynamically stable  PLAN: Weightbearing: WBAT RLE ROM: Ok for knee ROM as tolerated Incisional and dressing care:  change PRN Showering: Ok to begin showering with assistance 05/02/21.  Incisions may get wet at that time Orthopedic device(s): None  Pain management:  1. Tylenol 325-650 mg q 6 hours PRN 2. Norco 5-325 mg q 6 hours PRN 3. Morphine 0.5 mg q 2 hours PRN VTE prophylaxis: Lovenox, SCDs ID: Vancomycin post op completed Foley/Lines:  No foley, KVO IVFs Impediments to Fracture Healing: Vit D level 26, start on D3 supplementation. Continue at d/c Dispo: Therapies as tolerated. PT/OT recommending SNF. Patient ok for d/c from ortho standpoint.  D/c rx for pain med, DVT prophylaxis, and Vit D3 signed and placed in chart. TOC following to assist with d/c needs.  Follow - up plan: 2 weeks  Contact information:  Truitt Merle MD, Ulyses Southward PA-C. After hours and holidays please check [email or web address] for group call information for [facility]   Kristen Menzer A. Michaelyn Barter, PA-C [phone] (office) [email or web address]

## 2021-05-01 NOTE — Discharge Summary (Signed)
Physician Discharge Summary  Kristen Garrison OZD:[id number] DOB: May 17, 1927 DOA: 04/29/2021  PCP: Eloisa Northern, MD  Admit date: 04/29/2021 Discharge date: 05/01/2021  Admitted From: Skilled nursing facility Disposition: Skilled nursing facility  Recommendations for Outpatient Follow-up:  Follow up with PCP in 1-2 weeks Please obtain BMP/CBC in one week Orthopedics will schedule follow-up  Home Health: N/A Equipment/Devices: N/A  Discharge Condition: Stable CODE STATUS: Full code Diet recommendation: Regular diet  Discharge summary: 87 year old female with history of chronic A. fib not on anticoagulation, hypertension, anxiety depression, GERD from long-term care facility presented after a mechanical fall and right knee pain.  Larey Seat out of her mattress.  Mostly nonambulatory.  Moves around in a wheelchair.  She was found to have periprosthetic right distal femur fracture.  Underwent ORIF on 10/30.    Assessment & plan of care:   Close traumatic periprosthetic fracture right femur: Status post ORIF 10/30 Dr. Jena Gauss Weightbearing as tolerated.  We will continue mobilize with PT OT. DVT prophylaxis with Lovenox. Adequate pain management with Tylenol, Norco and local measures.  Continue aggressive bowel regimen. Can be discharged back.   Chronic A. fib, not on anticoagulation: Rate controlled.  Not on any rate control medications.  Essential hypertension: Stable.  Chronic anxiety and depression: Stable.  COPD: Stable.   Patient fairly stabilized after surgery.  As per orthopedics plan, she is able to go back to skilled rehab today with adequate pain control and continue attempt to mobility.    Discharge Diagnoses:  Active Problems:   Right femoral fracture [facility])    Discharge Instructions  Discharge Instructions     Call MD for:  difficulty breathing, headache or visual disturbances   Complete by: As directed    Call MD for:  redness, tenderness, or signs of infection  (pain, swelling, redness, odor or green/yellow discharge around incision site)   Complete by: As directed    Call MD for:  temperature >100.4   Complete by: As directed    Diet - low sodium heart healthy   Complete by: As directed    Increase activity slowly   Complete by: As directed    Leave dressing on - Keep it clean, dry, and intact until clinic visit   Complete by: As directed       Allergies as of 05/01/2021       Reactions   Clindamycin/lincomycin Diarrhea   Sulfonamide Derivatives Cough   Warfarin Sodium Other (See Comments)   Bleeding problems--Likely due to uncorrected Warfarin requirements   Cefzil [cefprozil] Other (See Comments)   Unknown reaction..   Tramadol Other (See Comments)   Unknown reaction   Carvedilol Other (See Comments)   dizzy   Celecoxib Rash   Doxycycline Nausea Only   Gabapentin Rash   Guanfacine Hcl Diarrhea   Lisinopril Cough   Lyrica [pregabalin] Rash        Medication List     TAKE these medications    albuterol 108 (90 Base) MCG/ACT inhaler Commonly known as: VENTOLIN HFA Inhale 2 puffs into the lungs every 4 (four) hours as needed for wheezing or shortness of breath.   amLODipine 5 MG tablet Commonly known as: NORVASC Take 5 mg by mouth daily.   ammonium lactate 12 % lotion Commonly known as: LAC-HYDRIN Apply 1 application topically See admin instructions. Apply to bilateral legs topically at bedtime   bisacodyl 10 MG suppository Commonly known as: DULCOLAX Place 10 mg rectally every 4 (four) hours as needed (for constipation).  busPIRone 5 MG tablet Commonly known as: BUSPAR Take 5 mg by mouth 3 (three) times daily.   enoxaparin 40 MG/0.4ML injection Commonly known as: LOVENOX Inject 0.4 mLs (40 mg total) into the skin daily.   Ensure Take 237 mLs by mouth with breakfast, with lunch, and with evening meal.   famotidine 20 MG tablet Commonly known as: PEPCID Take 20 mg by mouth daily.   furosemide 80 MG  tablet Commonly known as: LASIX Take 80 mg by mouth daily.   HYDROcodone-acetaminophen 5-325 MG tablet Commonly known as: NORCO/VICODIN Take 1 tablet by mouth every 6 (six) hours as needed for moderate pain or severe pain. What changed:  when to take this reasons to take this additional instructions   KETOCONAZOLE (TOPICAL) 1 % Sham Apply 1 application topically See admin instructions. Apply to scalp/hair topically as needed for itchy scalp on shower days   latanoprost 0.005 % ophthalmic solution Commonly known as: XALATAN Place 1 drop into both eyes at bedtime.   loperamide 2 MG tablet Commonly known as: IMODIUM A-D Take 2 mg by mouth every 2 (two) hours as needed for diarrhea or loose stools.   multivitamin with minerals Tabs tablet Take 1 tablet by mouth daily.   Polyethyl Glycol-Propyl Glycol 0.4-0.3 % Soln Place 1 drop into both eyes 3 (three) times daily.   polyethylene glycol 17 g packet Commonly known as: MIRALAX / GLYCOLAX Take 17 g by mouth daily.   potassium chloride SA 20 MEQ tablet Commonly known as: KLOR-CON Take 20 mEq by mouth See admin instructions. Take one tablet (20 meq) by mouth on Monday thru Friday morning (skip Saturday and Sunday)   senna-docusate 8.6-50 MG tablet Commonly known as: Senokot-S Take 1 tablet by mouth 2 (two) times daily.   Symbicort 160-4.5 MCG/ACT inhaler Generic drug: budesonide-formoterol Inhale 2 puffs into the lungs 2 (two) times daily.   traZODone 50 MG tablet Commonly known as: DESYREL Take 1 tablet (50 mg total) by mouth at bedtime. What changed: how much to take   venlafaxine XR 150 MG 24 hr capsule Commonly known as: EFFEXOR-XR Take 150 mg by mouth daily with breakfast.   Vitamin D3 25 MCG tablet Commonly known as: Vitamin D Take 1 tablet (1,000 Units total) by mouth daily.               Discharge Care Instructions  (From admission, onward)           Start     Ordered   05/01/21 0000  Leave  dressing on - Keep it clean, dry, and intact until clinic visit        11 /01/22 [id number]            Follow-up Information     Haddix, Thomasene Lot, MD. Schedule an appointment as soon as possible for a visit in 2 week(s).   Specialty: Orthopedic Surgery Why: for wound check and repeat x-rays Contact information: [place] [address] [phone]                Allergies  Allergen Reactions   Clindamycin/Lincomycin Diarrhea   Sulfonamide Derivatives Cough   Warfarin Sodium Other (See Comments)    Bleeding problems--Likely due to uncorrected Warfarin requirements   Cefzil [Cefprozil] Other (See Comments)    Unknown reaction..   Tramadol Other (See Comments)    Unknown reaction   Carvedilol Other (See Comments)    dizzy   Celecoxib Rash   Doxycycline Nausea Only  Gabapentin Rash   Guanfacine Hcl Diarrhea   Lisinopril Cough   Lyrica [Pregabalin] Rash    Consultations: Orthopedics   Procedures/Studies: DG Ankle Complete Right  Result Date: 04/29/2021 CLINICAL DATA:  Fall EXAM: RIGHT ANKLE - COMPLETE 3+ VIEW COMPARISON:  None. FINDINGS: No evidence of acute fracture or dislocation. Deformity related to old distal tibial fracture with mild tibiotalar degenerative changes. Deformity related to old fibular shaft fracture. The visualized soft tissues are unremarkable. IMPRESSION: No evidence of acute fracture or dislocation. Deformity related to old distal tibia and fibula fractures, as above. Electronically Signed   By: Julian Hy M.D.   On: 04/29/2021 01:26   DG Knee Complete 4 Views Right  Result Date: 04/29/2021 CLINICAL DATA:  Fall EXAM: RIGHT KNEE - COMPLETE 4+ VIEW COMPARISON:  None. FINDINGS: Comminuted distal femoral shaft fracture with 1/2 shaft width lateral and posterior displacement. Right knee arthroplasty. No suprapatellar knee joint effusion. IMPRESSION: Comminuted, displaced distal femoral shaft fracture, as above. Electronically  Signed   By: Julian Hy M.D.   On: 04/29/2021 01:25   DG C-Arm 1-60 Min-No Report  Result Date: 04/29/2021 Fluoroscopy was utilized by the requesting physician.  No radiographic interpretation.   DG Hip Unilat W or Wo Pelvis 2-3 Views Right  Result Date: 04/29/2021 CLINICAL DATA:  Fall EXAM: DG HIP (WITH OR WITHOUT PELVIS) 2-3V RIGHT COMPARISON:  None. FINDINGS: Status post ORIF of the left hip, without evidence of complication. Moderate degenerative changes of the left hip. Mild degenerative changes of the right hip. Degenerative changes of the lower lumbar spine. No evidence of acute fracture or dislocation. Right hip and visualized bony pelvis are intact. IMPRESSION: No evidence of acute fracture or dislocation. Status post ORIF of the left hip, without evidence of complication. Degenerative changes of the lumbar spine and bilateral hips. Electronically Signed   By: Julian Hy M.D.   On: 04/29/2021 01:23   DG FEMUR, MIN 2 VIEWS RIGHT  Result Date: 04/29/2021 CLINICAL DATA:  Open reduction internal fixation of right femur fracture. EXAM: RIGHT FEMUR 2 VIEWS COMPARISON:  Preoperative radiograph earlier today. FINDINGS: Six fluoroscopic spot views obtained in the operating room of the right femur. Lateral plate and multi screw fixation of distal femur fracture. Knee arthroplasty is partially included. There are cerclage wires in the femoral shaft. Fluoroscopy time 49 seconds. Dose 5.29 mGy. IMPRESSION: Procedural fluoroscopy for  ORIF of distal femur fracture. Electronically Signed   By: Keith Rake M.D.   On: 04/29/2021 16:33   DG FEMUR PORT, MIN 2 VIEWS RIGHT  Result Date: 04/29/2021 CLINICAL DATA:  Postop. EXAM: RIGHT FEMUR PORTABLE 2 VIEW COMPARISON:  Preoperative knee and hip radiographs earlier today. FINDINGS: Lateral plate and multi screw fixation of distal femur fracture. Fracture is in improved alignment from preoperative imaging. Right knee arthroplasty is intact.  Probable remote right femoral shaft fracture with cortical thickening and cerclage wires. Recent postsurgical change includes air and edema of the soft tissues. IMPRESSION: ORIF distal femur fracture, in improved alignment from preoperative imaging. Electronically Signed   By: Keith Rake M.D.   On: 04/29/2021 18:20   (Echo, Carotid, EGD, Colonoscopy, ERCP)    Subjective: Patient seen and examined.  No overnight events.  Pleasant.  Denies any complaints.  Has minimum pain on the right thigh but she has not been walking on it.   Discharge Exam: Vitals:   04/30/21 2016 05/01/21 0746  BP: 127/89 138/65  Pulse: 87 69  Resp: 18 17  Temp: 98.7 F (37.1 C) 98.6 F (37 C)  SpO2: 94% 94%   Vitals:   04/30/21 1517 04/30/21 1545 04/30/21 2016 05/01/21 0746  BP: (!) 167/62 (!) 158/61 127/89 138/65  Pulse: 81  87 69  Resp: 17  18 17   Temp:  98.9 F (37.2 C) 98.7 F (37.1 C) 98.6 F (37 C)  TempSrc:  Oral Oral Oral  SpO2: 93%  94% 94%  Weight:      Height:        General: Pt is alert, awake, not in acute distress Cardiovascular: RRR, S1/S2 +, no rubs, no gallops Respiratory: CTA bilaterally, no wheezing, no rhonchi Abdominal: Soft, NT, ND, bowel sounds + Extremities:  Right thigh multiple incision sites dry and clean.  Minimal expected swelling around the incision site.      The results of significant diagnostics from this hospitalization (including imaging, microbiology, ancillary and laboratory) are listed below for reference.     Microbiology: Recent Results (from the past 240 hour(s))  Resp Panel by RT-PCR (Flu A&B, Covid) Nasopharyngeal Swab     Status: None   Collection Time: 04/29/21  1:23 AM   Specimen: Nasopharyngeal Swab; Nasopharyngeal(NP) swabs in vial transport medium  Result Value Ref Range Status   SARS Coronavirus 2 by RT PCR NEGATIVE NEGATIVE Final    Comment: (NOTE) SARS-CoV-2 target nucleic acids are NOT DETECTED.  The SARS-CoV-2 RNA is generally  detectable in upper respiratory specimens during the acute phase of infection. The lowest concentration of SARS-CoV-2 viral copies this assay can detect is 138 copies/mL. A negative result does not preclude SARS-Cov-2 infection and should not be used as the sole basis for treatment or other patient management decisions. A negative result may occur with  improper specimen collection/handling, submission of specimen other than nasopharyngeal swab, presence of viral mutation(s) within the areas targeted by this assay, and inadequate number of viral copies(<138 copies/mL). A negative result must be combined with clinical observations, patient history, and epidemiological information. The expected result is Negative.  Fact Sheet for Patients:  EntrepreneurPulse.com.au  Fact Sheet for Healthcare Providers:  IncredibleEmployment.be  This test is no t yet approved or cleared by the [place] FDA and  has been authorized for detection and/or diagnosis of SARS-CoV-2 by FDA under an Emergency Use Authorization (EUA). This EUA will remain  in effect (meaning this test can be used) for the duration of the COVID-19 declaration under Section 564(b)(1) of the Act, 21 U.S.C.section 360bbb-3(b)(1), unless the authorization is terminated  or revoked sooner.       Influenza A by PCR NEGATIVE NEGATIVE Final   Influenza B by PCR NEGATIVE NEGATIVE Final    Comment: (NOTE) The Xpert Xpress SARS-CoV-2/FLU/RSV plus assay is intended as an aid in the diagnosis of influenza from Nasopharyngeal swab specimens and should not be used as a sole basis for treatment. Nasal washings and aspirates are unacceptable for Xpert Xpress SARS-CoV-2/FLU/RSV testing.  Fact Sheet for Patients: EntrepreneurPulse.com.au  Fact Sheet for Healthcare Providers: IncredibleEmployment.be  This test is not yet approved or cleared by the [place] FDA  and has been authorized for detection and/or diagnosis of SARS-CoV-2 by FDA under an Emergency Use Authorization (EUA). This EUA will remain in effect (meaning this test can be used) for the duration of the COVID-19 declaration under Section 564(b)(1) of the Act, 21 U.S.C. section 360bbb-3(b)(1), unless the authorization is terminated or revoked.  Performed at [facility], [place] [address], [place], [place] [address]  Surgical pcr screen     Status: Abnormal   Collection Time: 04/29/21 11:49 AM   Specimen: Nasal Mucosa; Nasal Swab  Result Value Ref Range Status   MRSA, PCR POSITIVE (A) NEGATIVE Final    Comment: RESULT CALLED TO, READ BACK BY AND VERIFIED WITH: R,THOMAS @1601  04/29/21 EB    Staphylococcus aureus POSITIVE (A) NEGATIVE Final    Comment: (NOTE) The Xpert SA Assay (FDA approved for NASAL specimens in patients 56 years of age and older), is one component of a comprehensive surveillance program. It is not intended to diagnose infection nor to guide or monitor treatment. Performed at [facility], [place] [address], [place], [place] [address]      Labs: BNP (last 3 results) No results for input(s): BNP in the last 8760 hours. Basic Metabolic Panel: Recent Labs  Lab 04/29/21 0032 04/29/21 0530 04/29/21 0941 04/30/21 0226 05/01/21 0237  NA 135 135  --  133* 134*  K 3.8 4.1  --  4.1 3.7  CL 98 97*  --  100 100  CO2 26 28  --  28 27  GLUCOSE 148* 116*  --  131* 130*  BUN 25* 24*  --  16 15  CREATININE 1.00 1.03* 1.14* 1.18* 1.10*  CALCIUM 8.9 8.7*  --  8.4* 8.2*  MG  --  2.3  --   --   --   PHOS  --  4.0  --   --   --    Liver Function Tests: No results for input(s): AST, ALT, ALKPHOS, BILITOT, PROT, ALBUMIN in the last 168 hours. No results for input(s): LIPASE, AMYLASE in the last 168 hours. No results for input(s): AMMONIA in the last 168 hours. CBC: Recent Labs  Lab 04/29/21 0032 04/29/21 0530 04/29/21 0941 04/30/21 0226  05/01/21 0237  WBC 10.9* 13.6* 10.4 10.7* 11.2*  NEUTROABS 5.0  --   --   --   --   HGB 11.1* 11.5* 10.9* 9.1* 9.0*  HCT 34.7* 36.1 33.7* 28.5* 27.7*  MCV 92.3 93.0 91.8 91.9 91.7  PLT 318 358 337 286 269   Cardiac Enzymes: No results for input(s): CKTOTAL, CKMB, CKMBINDEX, TROPONINI in the last 168 hours. BNP: Invalid input(s): POCBNP CBG: No results for input(s): GLUCAP in the last 168 hours. D-Dimer No results for input(s): DDIMER in the last 72 hours. Hgb A1c No results for input(s): HGBA1C in the last 72 hours. Lipid Profile No results for input(s): CHOL, HDL, LDLCALC, TRIG, CHOLHDL, LDLDIRECT in the last 72 hours. Thyroid function studies No results for input(s): TSH, T4TOTAL, T3FREE, THYROIDAB in the last 72 hours.  Invalid input(s): FREET3 Anemia work up No results for input(s): VITAMINB12, FOLATE, FERRITIN, TIBC, IRON, RETICCTPCT in the last 72 hours. Urinalysis    Component Value Date/Time   COLORURINE YELLOW 01/26/2018 1717   APPEARANCEUR CLEAR 01/26/2018 1717   LABSPEC 1.012 01/26/2018 1717   PHURINE 5.0 01/26/2018 1717   GLUCOSEU NEGATIVE 01/26/2018 1717   HGBUR NEGATIVE 01/26/2018 1717   BILIRUBINUR NEGATIVE 01/26/2018 1717   KETONESUR NEGATIVE 01/26/2018 1717   PROTEINUR NEGATIVE 01/26/2018 1717   UROBILINOGEN 0.2 06/03/2013 2046   NITRITE NEGATIVE 01/26/2018 1717   LEUKOCYTESUR NEGATIVE 01/26/2018 1717   Sepsis Labs Invalid input(s): PROCALCITONIN,  WBC,  LACTICIDVEN Microbiology Recent Results (from the past 240 hour(s))  Resp Panel by RT-PCR (Flu A&B, Covid) Nasopharyngeal Swab     Status: None   Collection Time: 04/29/21  1:23 AM   Specimen: Nasopharyngeal Swab;  Nasopharyngeal(NP) swabs in vial transport medium  Result Value Ref Range Status   SARS Coronavirus 2 by RT PCR NEGATIVE NEGATIVE Final    Comment: (NOTE) SARS-CoV-2 target nucleic acids are NOT DETECTED.  The SARS-CoV-2 RNA is generally detectable in upper respiratory specimens  during the acute phase of infection. The lowest concentration of SARS-CoV-2 viral copies this assay can detect is 138 copies/mL. A negative result does not preclude SARS-Cov-2 infection and should not be used as the sole basis for treatment or other patient management decisions. A negative result may occur with  improper specimen collection/handling, submission of specimen other than nasopharyngeal swab, presence of viral mutation(s) within the areas targeted by this assay, and inadequate number of viral copies(<138 copies/mL). A negative result must be combined with clinical observations, patient history, and epidemiological information. The expected result is Negative.  Fact Sheet for Patients:  EntrepreneurPulse.com.au  Fact Sheet for Healthcare Providers:  IncredibleEmployment.be  This test is no t yet approved or cleared by the [place] FDA and  has been authorized for detection and/or diagnosis of SARS-CoV-2 by FDA under an Emergency Use Authorization (EUA). This EUA will remain  in effect (meaning this test can be used) for the duration of the COVID-19 declaration under Section 564(b)(1) of the Act, 21 U.S.C.section 360bbb-3(b)(1), unless the authorization is terminated  or revoked sooner.       Influenza A by PCR NEGATIVE NEGATIVE Final   Influenza B by PCR NEGATIVE NEGATIVE Final    Comment: (NOTE) The Xpert Xpress SARS-CoV-2/FLU/RSV plus assay is intended as an aid in the diagnosis of influenza from Nasopharyngeal swab specimens and should not be used as a sole basis for treatment. Nasal washings and aspirates are unacceptable for Xpert Xpress SARS-CoV-2/FLU/RSV testing.  Fact Sheet for Patients: EntrepreneurPulse.com.au  Fact Sheet for Healthcare Providers: IncredibleEmployment.be  This test is not yet approved or cleared by the [place] FDA and has been authorized for detection  and/or diagnosis of SARS-CoV-2 by FDA under an Emergency Use Authorization (EUA). This EUA will remain in effect (meaning this test can be used) for the duration of the COVID-19 declaration under Section 564(b)(1) of the Act, 21 U.S.C. section 360bbb-3(b)(1), unless the authorization is terminated or revoked.  Performed at [facility], [place] [address], [place], [place] [address]   Surgical pcr screen     Status: Abnormal   Collection Time: 04/29/21 11:49 AM   Specimen: Nasal Mucosa; Nasal Swab  Result Value Ref Range Status   MRSA, PCR POSITIVE (A) NEGATIVE Final    Comment: RESULT CALLED TO, READ BACK BY AND VERIFIED WITH: R,THOMAS @1601  04/29/21 EB    Staphylococcus aureus POSITIVE (A) NEGATIVE Final    Comment: (NOTE) The Xpert SA Assay (FDA approved for NASAL specimens in patients 64 years of age and older), is one component of a comprehensive surveillance program. It is not intended to diagnose infection nor to guide or monitor treatment. Performed at [facility], [place] [address] [place], [place] [address]      Time coordinating discharge:  35 minutes  SIGNED:   Barb Merino, MD  [facility] 05/01/2021, 11:15 AM

## 2021-05-01 NOTE — TOC CAGE-AID Note (Signed)
Transition of Care [facility]) - CAGE-AID Screening   Patient Details  Name: Kristen Garrison MRN: [id number] Date of Birth: 1926/11/23  Transition of Care [facility]) CM/SW Contact:    Donnalynn Wheeless C Tarpley-Carter, LCSWA Phone Number: 05/01/2021, 12:18 PM   Clinical Narrative: Pt is unable to participate in Cage Aid. Pt is not appropriate for assessment.   Dequita Schleicher Tarpley-Carter, MSW, LCSW-A Pronouns:  She/Her/Hers [facility] Clinical Social Worker Direct Number:  [phone] [email or web address]   CAGE-AID Screening: Substance Abuse Screening unable to be completed due to: : Patient unable to participate (Pt is inappropriate for assessment.)             Substance Abuse Education Offered: No

## 2021-05-02 LAB — TYPE AND SCREEN
ABO/RH(D): O NEG
Antibody Screen: POSITIVE
Donor AG Type: NEGATIVE
Donor AG Type: NEGATIVE
Unit division: 0
Unit division: 0

## 2021-05-02 LAB — BPAM RBC
Blood Product Expiration Date: 202211222359
Blood Product Expiration Date: 202211232359
Unit Type and Rh: 9500
Unit Type and Rh: 9500

## 2022-07-02 DIAGNOSIS — M6281 Muscle weakness (generalized): Secondary | ICD-10-CM

## 2022-07-02 DIAGNOSIS — F03918 Unspecified dementia, unspecified severity, with other behavioral disturbance: Secondary | ICD-10-CM

## 2022-07-02 DIAGNOSIS — S60222A Contusion of left hand, initial encounter: Secondary | ICD-10-CM

## 2022-07-02 DIAGNOSIS — E039 Hypothyroidism, unspecified: Secondary | ICD-10-CM

## 2022-07-02 DIAGNOSIS — Z9181 History of falling: Secondary | ICD-10-CM

## 2022-07-02 DIAGNOSIS — W19XXXA Unspecified fall, initial encounter: Secondary | ICD-10-CM

## 2022-07-02 DIAGNOSIS — R2689 Other abnormalities of gait and mobility: Secondary | ICD-10-CM

## 2022-07-08 DIAGNOSIS — U071 COVID-19: Secondary | ICD-10-CM

## 2022-07-10 DIAGNOSIS — D649 Anemia, unspecified: Secondary | ICD-10-CM

## 2022-07-10 DIAGNOSIS — F03918 Unspecified dementia, unspecified severity, with other behavioral disturbance: Secondary | ICD-10-CM

## 2022-07-10 DIAGNOSIS — N1831 Chronic kidney disease, stage 3a: Secondary | ICD-10-CM

## 2022-07-10 DIAGNOSIS — U071 COVID-19: Secondary | ICD-10-CM

## 2022-07-15 DIAGNOSIS — W19XXXA Unspecified fall, initial encounter: Secondary | ICD-10-CM

## 2022-07-15 DIAGNOSIS — R2689 Other abnormalities of gait and mobility: Secondary | ICD-10-CM

## 2022-07-15 DIAGNOSIS — M6281 Muscle weakness (generalized): Secondary | ICD-10-CM

## 2022-07-15 DIAGNOSIS — F03918 Unspecified dementia, unspecified severity, with other behavioral disturbance: Secondary | ICD-10-CM

## 2022-07-15 DIAGNOSIS — U071 COVID-19: Secondary | ICD-10-CM

## 2022-07-15 DIAGNOSIS — Z9181 History of falling: Secondary | ICD-10-CM

## 2022-08-13 DIAGNOSIS — F419 Anxiety disorder, unspecified: Secondary | ICD-10-CM

## 2022-08-13 DIAGNOSIS — I10 Essential (primary) hypertension: Secondary | ICD-10-CM | POA: Diagnosis not present

## 2022-08-13 DIAGNOSIS — K5900 Constipation, unspecified: Secondary | ICD-10-CM

## 2022-08-13 DIAGNOSIS — M6281 Muscle weakness (generalized): Secondary | ICD-10-CM

## 2022-08-13 DIAGNOSIS — J449 Chronic obstructive pulmonary disease, unspecified: Secondary | ICD-10-CM | POA: Diagnosis not present

## 2022-08-13 DIAGNOSIS — K219 Gastro-esophageal reflux disease without esophagitis: Secondary | ICD-10-CM

## 2022-08-13 DIAGNOSIS — F03918 Unspecified dementia, unspecified severity, with other behavioral disturbance: Secondary | ICD-10-CM

## 2022-08-13 DIAGNOSIS — E039 Hypothyroidism, unspecified: Secondary | ICD-10-CM | POA: Diagnosis not present

## 2022-08-13 DIAGNOSIS — F331 Major depressive disorder, recurrent, moderate: Secondary | ICD-10-CM

## 2022-08-13 DIAGNOSIS — G894 Chronic pain syndrome: Secondary | ICD-10-CM | POA: Diagnosis not present

## 2022-08-13 DIAGNOSIS — Z6839 Body mass index (BMI) 39.0-39.9, adult: Secondary | ICD-10-CM

## 2022-08-13 DIAGNOSIS — Z9181 History of falling: Secondary | ICD-10-CM

## 2022-08-26 DIAGNOSIS — K219 Gastro-esophageal reflux disease without esophagitis: Secondary | ICD-10-CM | POA: Diagnosis not present

## 2022-10-18 DIAGNOSIS — R531 Weakness: Secondary | ICD-10-CM

## 2022-10-18 DIAGNOSIS — R2689 Other abnormalities of gait and mobility: Secondary | ICD-10-CM

## 2022-10-18 DIAGNOSIS — M6281 Muscle weakness (generalized): Secondary | ICD-10-CM

## 2022-12-02 DIAGNOSIS — R2689 Other abnormalities of gait and mobility: Secondary | ICD-10-CM

## 2022-12-02 DIAGNOSIS — N1831 Chronic kidney disease, stage 3a: Secondary | ICD-10-CM

## 2022-12-02 DIAGNOSIS — M6281 Muscle weakness (generalized): Secondary | ICD-10-CM

## 2022-12-02 DIAGNOSIS — F03918 Unspecified dementia, unspecified severity, with other behavioral disturbance: Secondary | ICD-10-CM

## 2022-12-02 DIAGNOSIS — Z7689 Persons encountering health services in other specified circumstances: Secondary | ICD-10-CM

## 2022-12-02 DIAGNOSIS — D649 Anemia, unspecified: Secondary | ICD-10-CM

## 2022-12-04 DIAGNOSIS — T3695XA Adverse effect of unspecified systemic antibiotic, initial encounter: Secondary | ICD-10-CM

## 2022-12-04 DIAGNOSIS — B370 Candidal stomatitis: Secondary | ICD-10-CM

## 2023-01-08 DIAGNOSIS — D649 Anemia, unspecified: Secondary | ICD-10-CM

## 2023-01-08 DIAGNOSIS — R2689 Other abnormalities of gait and mobility: Secondary | ICD-10-CM

## 2023-01-08 DIAGNOSIS — F03918 Unspecified dementia, unspecified severity, with other behavioral disturbance: Secondary | ICD-10-CM

## 2023-01-08 DIAGNOSIS — N390 Urinary tract infection, site not specified: Secondary | ICD-10-CM

## 2023-01-08 DIAGNOSIS — M6281 Muscle weakness (generalized): Secondary | ICD-10-CM | POA: Diagnosis not present

## 2023-01-08 DIAGNOSIS — N1831 Chronic kidney disease, stage 3a: Secondary | ICD-10-CM

## 2023-01-23 DIAGNOSIS — F03918 Unspecified dementia, unspecified severity, with other behavioral disturbance: Secondary | ICD-10-CM

## 2023-01-23 DIAGNOSIS — R2689 Other abnormalities of gait and mobility: Secondary | ICD-10-CM

## 2023-01-23 DIAGNOSIS — N390 Urinary tract infection, site not specified: Secondary | ICD-10-CM

## 2023-01-23 DIAGNOSIS — M6281 Muscle weakness (generalized): Secondary | ICD-10-CM

## 2023-01-23 DIAGNOSIS — N1831 Chronic kidney disease, stage 3a: Secondary | ICD-10-CM

## 2023-01-23 DIAGNOSIS — Z7689 Persons encountering health services in other specified circumstances: Secondary | ICD-10-CM

## 2023-04-03 DIAGNOSIS — E039 Hypothyroidism, unspecified: Secondary | ICD-10-CM | POA: Diagnosis not present

## 2023-04-03 DIAGNOSIS — F320 Major depressive disorder, single episode, mild: Secondary | ICD-10-CM

## 2023-04-03 DIAGNOSIS — F419 Anxiety disorder, unspecified: Secondary | ICD-10-CM

## 2023-04-03 DIAGNOSIS — J449 Chronic obstructive pulmonary disease, unspecified: Secondary | ICD-10-CM

## 2023-04-03 DIAGNOSIS — F0390 Unspecified dementia without behavioral disturbance: Secondary | ICD-10-CM

## 2023-04-03 DIAGNOSIS — I10 Essential (primary) hypertension: Secondary | ICD-10-CM | POA: Diagnosis not present

## 2023-04-03 DIAGNOSIS — N183 Chronic kidney disease, stage 3 unspecified: Secondary | ICD-10-CM | POA: Diagnosis not present

## 2023-04-03 DIAGNOSIS — I4821 Permanent atrial fibrillation: Secondary | ICD-10-CM | POA: Diagnosis not present

## 2023-05-23 DIAGNOSIS — F320 Major depressive disorder, single episode, mild: Secondary | ICD-10-CM

## 2023-05-23 DIAGNOSIS — F0390 Unspecified dementia without behavioral disturbance: Secondary | ICD-10-CM | POA: Diagnosis not present

## 2023-05-23 DIAGNOSIS — I4821 Permanent atrial fibrillation: Secondary | ICD-10-CM

## 2023-05-23 DIAGNOSIS — F419 Anxiety disorder, unspecified: Secondary | ICD-10-CM | POA: Diagnosis not present

## 2023-05-23 DIAGNOSIS — B372 Candidiasis of skin and nail: Secondary | ICD-10-CM

## 2023-05-23 DIAGNOSIS — E039 Hypothyroidism, unspecified: Secondary | ICD-10-CM

## 2023-05-23 DIAGNOSIS — J449 Chronic obstructive pulmonary disease, unspecified: Secondary | ICD-10-CM

## 2023-05-23 DIAGNOSIS — N183 Chronic kidney disease, stage 3 unspecified: Secondary | ICD-10-CM | POA: Diagnosis not present

## 2023-05-23 DIAGNOSIS — I10 Essential (primary) hypertension: Secondary | ICD-10-CM | POA: Diagnosis not present

## 2023-06-30 DIAGNOSIS — F419 Anxiety disorder, unspecified: Secondary | ICD-10-CM

## 2023-06-30 DIAGNOSIS — I10 Essential (primary) hypertension: Secondary | ICD-10-CM | POA: Diagnosis not present

## 2023-06-30 DIAGNOSIS — F0390 Unspecified dementia without behavioral disturbance: Secondary | ICD-10-CM | POA: Diagnosis not present

## 2023-06-30 DIAGNOSIS — I4821 Permanent atrial fibrillation: Secondary | ICD-10-CM

## 2023-06-30 DIAGNOSIS — F320 Major depressive disorder, single episode, mild: Secondary | ICD-10-CM

## 2023-06-30 DIAGNOSIS — N183 Chronic kidney disease, stage 3 unspecified: Secondary | ICD-10-CM | POA: Diagnosis not present

## 2023-06-30 DIAGNOSIS — E039 Hypothyroidism, unspecified: Secondary | ICD-10-CM

## 2023-06-30 DIAGNOSIS — J449 Chronic obstructive pulmonary disease, unspecified: Secondary | ICD-10-CM

## 2023-06-30 DIAGNOSIS — B372 Candidiasis of skin and nail: Secondary | ICD-10-CM | POA: Diagnosis not present

## 2023-08-30 DEATH — deceased
